# Patient Record
Sex: Female | Born: 1970
Health system: Southern US, Community
[De-identification: ages and names within clinical notes are randomized; demographics above are authoritative.]

## PROBLEM LIST (undated history)

## (undated) ENCOUNTER — Inpatient Hospital Stay: Admission: EM | Payer: Self-pay | Source: Home / Self Care

## (undated) DIAGNOSIS — R110 Nausea: Secondary | ICD-10-CM

## (undated) DIAGNOSIS — E039 Hypothyroidism, unspecified: Secondary | ICD-10-CM

## (undated) DIAGNOSIS — T451X5A Adverse effect of antineoplastic and immunosuppressive drugs, initial encounter: Secondary | ICD-10-CM

## (undated) DIAGNOSIS — I808 Phlebitis and thrombophlebitis of other sites: Secondary | ICD-10-CM

## (undated) DIAGNOSIS — I10 Essential (primary) hypertension: Secondary | ICD-10-CM

## (undated) DIAGNOSIS — F32A Depression, unspecified: Secondary | ICD-10-CM

## (undated) DIAGNOSIS — R51 Headache: Secondary | ICD-10-CM

## (undated) DIAGNOSIS — Z5189 Encounter for other specified aftercare: Secondary | ICD-10-CM

## (undated) DIAGNOSIS — E785 Hyperlipidemia, unspecified: Secondary | ICD-10-CM

## (undated) DIAGNOSIS — IMO0001 Reserved for inherently not codable concepts without codable children: Secondary | ICD-10-CM

## (undated) DIAGNOSIS — Z8672 Personal history of thrombophlebitis: Secondary | ICD-10-CM

## (undated) DIAGNOSIS — I82409 Acute embolism and thrombosis of unspecified deep veins of unspecified lower extremity: Secondary | ICD-10-CM

## (undated) DIAGNOSIS — F329 Major depressive disorder, single episode, unspecified: Secondary | ICD-10-CM

## (undated) DIAGNOSIS — F419 Anxiety disorder, unspecified: Secondary | ICD-10-CM

## (undated) DIAGNOSIS — K521 Toxic gastroenteritis and colitis: Secondary | ICD-10-CM

## (undated) DIAGNOSIS — D6851 Activated protein C resistance: Secondary | ICD-10-CM

## (undated) DIAGNOSIS — R300 Dysuria: Secondary | ICD-10-CM

## (undated) DIAGNOSIS — M1990 Unspecified osteoarthritis, unspecified site: Secondary | ICD-10-CM

## (undated) DIAGNOSIS — Z7901 Long term (current) use of anticoagulants: Secondary | ICD-10-CM

## (undated) DIAGNOSIS — Z95828 Presence of other vascular implants and grafts: Secondary | ICD-10-CM

## (undated) DIAGNOSIS — D689 Coagulation defect, unspecified: Secondary | ICD-10-CM

## (undated) DIAGNOSIS — C539 Malignant neoplasm of cervix uteri, unspecified: Secondary | ICD-10-CM

## (undated) DIAGNOSIS — I82621 Acute embolism and thrombosis of deep veins of right upper extremity: Secondary | ICD-10-CM

## (undated) DIAGNOSIS — D649 Anemia, unspecified: Secondary | ICD-10-CM

## (undated) DIAGNOSIS — IMO0002 Reserved for concepts with insufficient information to code with codable children: Secondary | ICD-10-CM

## (undated) DIAGNOSIS — K219 Gastro-esophageal reflux disease without esophagitis: Secondary | ICD-10-CM

## (undated) DIAGNOSIS — T7840XA Allergy, unspecified, initial encounter: Secondary | ICD-10-CM

## (undated) DIAGNOSIS — R519 Headache, unspecified: Secondary | ICD-10-CM

## (undated) HISTORY — DX: Hypothyroidism, unspecified: E03.9

## (undated) HISTORY — DX: Activated protein C resistance: D68.51

## (undated) HISTORY — DX: Allergy, unspecified, initial encounter: T78.40XA

## (undated) HISTORY — DX: Acute embolism and thrombosis of unspecified deep veins of unspecified lower extremity: I82.409

## (undated) HISTORY — DX: Anxiety disorder, unspecified: F41.9

## (undated) HISTORY — PX: CARPAL TUNNEL RELEASE: SHX101

## (undated) HISTORY — DX: Essential (primary) hypertension: I10

## (undated) HISTORY — DX: Hyperlipidemia, unspecified: E78.5

## (undated) HISTORY — DX: Unspecified osteoarthritis, unspecified site: M19.90

## (undated) HISTORY — DX: Coagulation defect, unspecified: D68.9

## (undated) HISTORY — DX: Encounter for other specified aftercare: Z51.89

---

## 1994-04-24 HISTORY — PX: TUBAL LIGATION: SHX77

## 2004-07-27 ENCOUNTER — Encounter: Admission: RE | Admit: 2004-07-27 | Discharge: 2004-07-27 | Payer: Self-pay | Admitting: Obstetrics and Gynecology

## 2005-08-08 ENCOUNTER — Emergency Department (HOSPITAL_COMMUNITY): Admission: EM | Admit: 2005-08-08 | Discharge: 2005-08-08 | Payer: Self-pay | Admitting: Emergency Medicine

## 2007-05-17 ENCOUNTER — Ambulatory Visit (HOSPITAL_COMMUNITY): Admission: RE | Admit: 2007-05-17 | Discharge: 2007-05-17 | Payer: Self-pay | Admitting: Obstetrics

## 2007-06-01 ENCOUNTER — Emergency Department (HOSPITAL_COMMUNITY): Admission: EM | Admit: 2007-06-01 | Discharge: 2007-06-01 | Payer: Self-pay | Admitting: Emergency Medicine

## 2007-11-23 ENCOUNTER — Emergency Department (HOSPITAL_COMMUNITY): Admission: EM | Admit: 2007-11-23 | Discharge: 2007-11-23 | Payer: Self-pay | Admitting: Emergency Medicine

## 2007-12-12 ENCOUNTER — Emergency Department (HOSPITAL_COMMUNITY): Admission: EM | Admit: 2007-12-12 | Discharge: 2007-12-12 | Payer: Self-pay | Admitting: Emergency Medicine

## 2008-02-04 ENCOUNTER — Encounter: Admission: RE | Admit: 2008-02-04 | Discharge: 2008-02-04 | Payer: Self-pay | Admitting: Family Medicine

## 2008-03-12 ENCOUNTER — Encounter: Admission: RE | Admit: 2008-03-12 | Discharge: 2008-03-12 | Payer: Self-pay | Admitting: Family Medicine

## 2008-04-25 ENCOUNTER — Emergency Department (HOSPITAL_COMMUNITY): Admission: EM | Admit: 2008-04-25 | Discharge: 2008-04-25 | Payer: Self-pay | Admitting: Specialist

## 2008-09-14 ENCOUNTER — Emergency Department (HOSPITAL_COMMUNITY): Admission: EM | Admit: 2008-09-14 | Discharge: 2008-09-14 | Payer: Self-pay | Admitting: Emergency Medicine

## 2008-12-07 ENCOUNTER — Encounter: Admission: RE | Admit: 2008-12-07 | Discharge: 2008-12-07 | Payer: Self-pay | Admitting: Family Medicine

## 2008-12-16 ENCOUNTER — Emergency Department (HOSPITAL_COMMUNITY): Admission: EM | Admit: 2008-12-16 | Discharge: 2008-12-16 | Payer: Self-pay | Admitting: Emergency Medicine

## 2009-03-24 ENCOUNTER — Emergency Department (HOSPITAL_COMMUNITY): Admission: EM | Admit: 2009-03-24 | Discharge: 2009-03-24 | Payer: Self-pay | Admitting: Emergency Medicine

## 2009-07-09 ENCOUNTER — Emergency Department (HOSPITAL_COMMUNITY): Admission: EM | Admit: 2009-07-09 | Discharge: 2009-07-09 | Payer: Self-pay | Admitting: Emergency Medicine

## 2009-09-10 ENCOUNTER — Emergency Department (HOSPITAL_COMMUNITY): Admission: EM | Admit: 2009-09-10 | Discharge: 2009-09-10 | Payer: Self-pay | Admitting: Emergency Medicine

## 2010-05-15 ENCOUNTER — Encounter: Payer: Self-pay | Admitting: Obstetrics

## 2010-07-11 LAB — URINALYSIS, ROUTINE W REFLEX MICROSCOPIC
Glucose, UA: NEGATIVE mg/dL
Hgb urine dipstick: NEGATIVE
Ketones, ur: NEGATIVE mg/dL
Protein, ur: NEGATIVE mg/dL
Urobilinogen, UA: 0.2 mg/dL (ref 0.0–1.0)

## 2010-07-11 LAB — POCT PREGNANCY, URINE: Preg Test, Ur: NEGATIVE

## 2010-07-15 ENCOUNTER — Emergency Department (HOSPITAL_COMMUNITY)
Admission: EM | Admit: 2010-07-15 | Discharge: 2010-07-15 | Disposition: A | Discharge status: Home / Self Care | Payer: Self-pay | Attending: Emergency Medicine | Admitting: Emergency Medicine

## 2010-07-15 DIAGNOSIS — R1031 Right lower quadrant pain: Secondary | ICD-10-CM | POA: Insufficient documentation

## 2010-07-15 LAB — BASIC METABOLIC PANEL
CO2: 25 mEq/L (ref 19–32)
Chloride: 106 mEq/L (ref 96–112)
GFR calc Af Amer: >60 mL/min (ref >60)
GFR calc non Af Amer: >60 mL/min (ref >60)
Glucose, Bld: 103 mg/dL — ABNORMAL HIGH (ref 70–99)
Potassium: 3.8 mEq/L (ref 3.5–5.1)
Sodium: 137 mEq/L (ref 135–145)

## 2010-07-15 LAB — URINALYSIS, ROUTINE W REFLEX MICROSCOPIC
Bilirubin Urine: NEGATIVE
Glucose, UA: NEGATIVE mg/dL
Hgb urine dipstick: NEGATIVE
Nitrite: NEGATIVE

## 2010-07-15 LAB — DIFFERENTIAL
Basophils Absolute: 0 10*3/uL (ref 0.0–0.1)
Basophils Relative: 0 % (ref 0–1)
Eosinophils Absolute: 0.1 10*3/uL (ref 0.0–0.7)
Monocytes Absolute: 0.5 10*3/uL (ref 0.1–1.0)
Monocytes Relative: 7 % (ref 3–12)
Neutro Abs: 4.7 10*3/uL (ref 1.7–7.7)
Neutrophils Relative %: 65 % (ref 43–77)

## 2010-07-15 LAB — CBC
Hemoglobin: 11.5 g/dL — ABNORMAL LOW (ref 12.0–15.0)
RBC: 4.2 MIL/uL (ref 3.87–5.11)
WBC: 7.3 10*3/uL (ref 4.0–10.5)

## 2010-07-16 ENCOUNTER — Other Ambulatory Visit (HOSPITAL_COMMUNITY): Payer: Self-pay | Admitting: Emergency Medicine

## 2010-07-16 ENCOUNTER — Ambulatory Visit (HOSPITAL_COMMUNITY)
Admit: 2010-07-16 | Discharge: 2010-07-16 | Disposition: A | Discharge status: Home / Self Care | Payer: Self-pay | Source: Ambulatory Visit | Attending: Emergency Medicine | Admitting: Emergency Medicine

## 2010-07-16 DIAGNOSIS — R102 Pelvic and perineal pain: Secondary | ICD-10-CM

## 2010-07-16 DIAGNOSIS — R1031 Right lower quadrant pain: Secondary | ICD-10-CM | POA: Insufficient documentation

## 2010-07-16 DIAGNOSIS — R1032 Left lower quadrant pain: Secondary | ICD-10-CM | POA: Insufficient documentation

## 2010-07-17 LAB — RAPID STREP SCREEN (MED CTR MEBANE ONLY): Streptococcus, Group A Screen (Direct): NEGATIVE

## 2010-07-18 ENCOUNTER — Other Ambulatory Visit (HOSPITAL_COMMUNITY): Payer: Self-pay

## 2010-07-26 LAB — DIFFERENTIAL
Eosinophils Absolute: 0.2 10*3/uL (ref 0.0–0.7)
Lymphocytes Relative: 36 % (ref 12–46)
Lymphs Abs: 2.7 10*3/uL (ref 0.7–4.0)
Monocytes Relative: 7 % (ref 3–12)
Neutrophils Relative %: 54 % (ref 43–77)

## 2010-07-26 LAB — URINALYSIS, ROUTINE W REFLEX MICROSCOPIC
Bilirubin Urine: NEGATIVE
Glucose, UA: NEGATIVE mg/dL
Protein, ur: NEGATIVE mg/dL
Urobilinogen, UA: 0.2 mg/dL (ref 0.0–1.0)

## 2010-07-26 LAB — BASIC METABOLIC PANEL
BUN: 11 mg/dL (ref 6–23)
Chloride: 104 mEq/L (ref 96–112)
Creatinine, Ser: 0.82 mg/dL (ref 0.4–1.2)
GFR calc Af Amer: >60 mL/min (ref >60)
GFR calc non Af Amer: >60 mL/min (ref >60)
Potassium: 3.6 mEq/L (ref 3.5–5.1)

## 2010-07-26 LAB — URINE MICROSCOPIC-ADD ON

## 2010-07-26 LAB — CBC
HCT: 34.9 % — ABNORMAL LOW (ref 36.0–46.0)
MCV: 83.5 fL (ref 78.0–100.0)
Platelets: 369 10*3/uL (ref 150–400)
RBC: 4.18 MIL/uL (ref 3.87–5.11)
WBC: 7.3 10*3/uL (ref 4.0–10.5)

## 2010-07-30 LAB — COMPREHENSIVE METABOLIC PANEL
AST: 21 U/L (ref 0–37)
BUN: 11 mg/dL (ref 6–23)
CO2: 29 mEq/L (ref 19–32)
Calcium: 10 mg/dL (ref 8.4–10.5)
Chloride: 99 mEq/L (ref 96–112)
Creatinine, Ser: 0.9 mg/dL (ref 0.4–1.2)
GFR calc non Af Amer: >60 mL/min (ref >60)
Glucose, Bld: 89 mg/dL (ref 70–99)
Total Bilirubin: 0.6 mg/dL (ref 0.3–1.2)

## 2010-07-30 LAB — POCT CARDIAC MARKERS
Myoglobin, poc: 40.1 ng/mL (ref 12–200)
Troponin i, poc: <0.05 ng/mL (ref 0.00–0.09)

## 2010-07-30 LAB — CBC
HCT: 37.2 % (ref 36.0–46.0)
MCHC: 34.6 g/dL (ref 30.0–36.0)
MCV: 83.5 fL (ref 78.0–100.0)
RBC: 4.46 MIL/uL (ref 3.87–5.11)
WBC: 7.8 10*3/uL (ref 4.0–10.5)

## 2010-07-30 LAB — DIFFERENTIAL
Basophils Absolute: 0.1 10*3/uL (ref 0.0–0.1)
Eosinophils Relative: 1 % (ref 0–5)
Lymphocytes Relative: 25 % (ref 12–46)
Lymphs Abs: 1.9 10*3/uL (ref 0.7–4.0)
Neutro Abs: 5.2 10*3/uL (ref 1.7–7.7)
Neutrophils Relative %: 66 % (ref 43–77)

## 2010-08-08 LAB — BASIC METABOLIC PANEL
CO2: 26 mEq/L (ref 19–32)
Calcium: 9.5 mg/dL (ref 8.4–10.5)
GFR calc Af Amer: >60 mL/min (ref >60)
GFR calc non Af Amer: >60 mL/min (ref >60)
Potassium: 4.5 mEq/L (ref 3.5–5.1)
Sodium: 137 mEq/L (ref 135–145)

## 2010-08-08 LAB — URINALYSIS, ROUTINE W REFLEX MICROSCOPIC
Bilirubin Urine: NEGATIVE
Nitrite: POSITIVE — AB
Protein, ur: 100 mg/dL — AB
pH: 5 (ref 5.0–8.0)

## 2010-08-08 LAB — DIFFERENTIAL
Lymphocytes Relative: 14 % (ref 12–46)
Monocytes Absolute: 0.7 10*3/uL (ref 0.1–1.0)
Monocytes Relative: 6 % (ref 3–12)
Neutro Abs: 9 10*3/uL — ABNORMAL HIGH (ref 1.7–7.7)

## 2010-08-08 LAB — CBC
HCT: 38.8 % (ref 36.0–46.0)
Hemoglobin: 12.9 g/dL (ref 12.0–15.0)
RBC: 4.61 MIL/uL (ref 3.87–5.11)

## 2010-08-08 LAB — URINE MICROSCOPIC-ADD ON

## 2011-03-20 ENCOUNTER — Emergency Department (HOSPITAL_COMMUNITY)
Admission: EM | Admit: 2011-03-20 | Discharge: 2011-03-20 | Disposition: A | Discharge status: Home / Self Care | Payer: Self-pay | Attending: Emergency Medicine | Admitting: Emergency Medicine

## 2011-03-20 ENCOUNTER — Encounter: Payer: Self-pay | Admitting: *Deleted

## 2011-03-20 DIAGNOSIS — H6690 Otitis media, unspecified, unspecified ear: Secondary | ICD-10-CM | POA: Insufficient documentation

## 2011-03-20 DIAGNOSIS — R51 Headache: Secondary | ICD-10-CM | POA: Insufficient documentation

## 2011-03-20 DIAGNOSIS — H6691 Otitis media, unspecified, right ear: Secondary | ICD-10-CM

## 2011-03-20 MED ORDER — HYDROCODONE-ACETAMINOPHEN 5-325 MG PO TABS
2.0000 | ORAL_TABLET | Freq: Once | ORAL | Status: AC
  Administered 2011-03-20: 2 via ORAL
  Filled 2011-03-20: qty 2

## 2011-03-20 MED ORDER — HYDROCODONE-ACETAMINOPHEN 5-325 MG PO TABS: 1.0000 | ORAL_TABLET | Freq: Four times a day (QID) | ORAL | Status: AC | PRN

## 2011-03-20 MED ORDER — ANTIPYRINE-BENZOCAINE 5.4-1.4 % OT SOLN
3.0000 [drp] | OTIC | Status: DC | PRN
  Administered 2011-03-20: 3 [drp] via OTIC
  Filled 2011-03-20: qty 10

## 2011-03-20 MED ORDER — PENICILLIN G BENZATHINE 1200000 UNIT/2ML IM SUSP
1.2000 10*6.[IU] | Freq: Once | INTRAMUSCULAR | Status: AC
  Administered 2011-03-20: 1.2 10*6.[IU] via INTRAMUSCULAR
  Filled 2011-03-20: qty 2

## 2011-03-20 NOTE — ED Notes (Signed)
Pt ambulatory and stable at discharge 

## 2011-03-20 NOTE — ED Provider Notes (Signed)
History     CSN: [id number] Arrival date & time: 03/20/2011  4:06 AM   None     Chief Complaint  Patient presents with  . Otalgia    R ear    (Consider location/radiation/quality/duration/timing/severity/associated sxs/prior treatment) HPI This is a 40 year old white female who woke up about an hour ago with severe right ear pain. She has had a scratchy throat for the past 3 days but no other cold symptoms. She denies fever. She states the pain in her right ears radiating to the right side of her head causing a headache. There are no mitigating or exacerbating factors. The right ear does feel stuffy.  History reviewed. No pertinent past medical history.  Past Surgical History  Procedure Date  . C section 1991   . Carpul tunnel on both hands 2002 and 2009     History reviewed. No pertinent family history.  History  Substance Use Topics  . Smoking status: Never Smoker   . Smokeless tobacco: Not on file  . Alcohol Use: No    OB History    Grav Para Term Preterm Abortions TAB SAB Ect Mult Living                  Review of Systems  All other systems reviewed and are negative.    Allergies  Review of patient's allergies indicates no known allergies.  Home Medications  No current outpatient prescriptions on file.  BP 112/79  Pulse 72  Temp(Src) 97.7 F (36.5 C) (Oral)  Resp 20  Ht 5\' 3"  (1.6 m)  Wt 180 lb (81.647 kg)  BMI 31.89 kg/m2  SpO2 98%  Physical Exam General: Well-developed, well-nourished female in no acute distress; appearance consistent with age of record HENT: normocephalic, atraumatic; left TM normal, right TM erythematous; no TMJ tenderness Eyes: Normal Neck: supple; no Heart: regular rate and rhythm Lungs: clear to auscultation bilaterally Abdomen: soft; nontender; nondistended Extremities: No deformity; full range of motion Neurologic: Awake, alert and oriented; motor function intact in all extremities and symmetric; no facial  droop Skin: Warm and dry     ED Course  Procedures (including critical care time)    MDM   The patient requests an antibiotic shot rather than by mouth medications.          Hanley Seamen, MD 03/20/11 [phone]

## 2011-03-20 NOTE — ED Notes (Signed)
Pt woke up from sleeping with R ear hurting, has had a slight sore throat x 3 days no congestion; Having some problems hearing out of it. Painful along with ha

## 2011-03-20 NOTE — ED Notes (Signed)
R ear pain; no congestion; Pain at 10 with headache also; difficult to hear at times

## 2011-11-10 ENCOUNTER — Encounter (HOSPITAL_COMMUNITY): Payer: Self-pay | Admitting: *Deleted

## 2011-11-10 ENCOUNTER — Emergency Department (HOSPITAL_COMMUNITY): Payer: BC Managed Care – PPO

## 2011-11-10 ENCOUNTER — Emergency Department (HOSPITAL_COMMUNITY)
Admission: EM | Admit: 2011-11-10 | Discharge: 2011-11-10 | Disposition: A | Discharge status: Home / Self Care | Payer: BC Managed Care – PPO | Attending: Emergency Medicine | Admitting: Emergency Medicine

## 2011-11-10 DIAGNOSIS — S335XXA Sprain of ligaments of lumbar spine, initial encounter: Secondary | ICD-10-CM | POA: Insufficient documentation

## 2011-11-10 DIAGNOSIS — S39012A Strain of muscle, fascia and tendon of lower back, initial encounter: Secondary | ICD-10-CM

## 2011-11-10 DIAGNOSIS — X58XXXA Exposure to other specified factors, initial encounter: Secondary | ICD-10-CM | POA: Insufficient documentation

## 2011-11-10 LAB — URINALYSIS, ROUTINE W REFLEX MICROSCOPIC
Bilirubin Urine: NEGATIVE
Glucose, UA: NEGATIVE mg/dL
Hgb urine dipstick: NEGATIVE
Specific Gravity, Urine: >1.03 — ABNORMAL HIGH (ref 1.005–1.030)
pH: 6 (ref 5.0–8.0)

## 2011-11-10 MED ORDER — IBUPROFEN 600 MG PO TABS: 600.0000 mg | ORAL_TABLET | Freq: Four times a day (QID) | ORAL | Status: AC | PRN

## 2011-11-10 MED ORDER — HYDROCODONE-ACETAMINOPHEN 5-325 MG PO TABS: 1.0000 | ORAL_TABLET | ORAL | Status: AC | PRN

## 2011-11-10 MED ORDER — KETOROLAC TROMETHAMINE 60 MG/2ML IM SOLN
60.0000 mg | Freq: Once | INTRAMUSCULAR | Status: AC
  Administered 2011-11-10: 60 mg via INTRAMUSCULAR
  Filled 2011-11-10: qty 2

## 2011-11-10 NOTE — ED Notes (Signed)
Lower back pain x 3 days. No known injury. Denies urinary symptoms.

## 2011-11-13 NOTE — ED Provider Notes (Signed)
History     CSN: [id number]  Arrival date & time 11/10/11  7829   First MD Initiated Contact with Patient 11/10/11 [phone]      Chief Complaint  Patient presents with  . Back Pain    (Consider location/radiation/quality/duration/timing/severity/associated sxs/prior treatment) HPI Comments: Lindsey Hughes  presents with acute low back pain which has which has been present for the past 3 days.   Patient denies any new injury specifically, but woke with her symptoms. Her pain is worsened with movement and palpation and is sharp.   There is not radiation into the  lower extremity.  There has been no weakness or numbness in the lower extremities and no urinary or bowel retention or incontinence.  Patient does not have a history of cancer or IVDU.   The history is provided by the patient.    History reviewed. No pertinent past medical history.  Past Surgical History  Procedure Date  . C section 1991   . Carpul tunnel on both hands 2002 and 2009     No family history on file.  History  Substance Use Topics  . Smoking status: Never Smoker   . Smokeless tobacco: Not on file  . Alcohol Use: No    OB History    Grav Para Term Preterm Abortions TAB SAB Ect Mult Living                  Review of Systems  Constitutional: Negative for fever.  Respiratory: Negative for shortness of breath.   Cardiovascular: Negative for chest pain and leg swelling.  Gastrointestinal: Negative for abdominal pain, constipation and abdominal distention.  Genitourinary: Negative for dysuria, urgency, frequency, flank pain and difficulty urinating.  Musculoskeletal: Positive for back pain. Negative for joint swelling and gait problem.  Skin: Negative for rash.  Neurological: Negative for weakness and numbness.    Allergies  Review of patient's allergies indicates no known allergies.  Home Medications   Current Outpatient Rx  Name Route Sig Dispense Refill  . HYDROCODONE-ACETAMINOPHEN 5-325 MG PO  TABS Oral Take 1 tablet by mouth every 4 (four) hours as needed for pain. 20 tablet 0  . IBUPROFEN 600 MG PO TABS Oral Take 1 tablet (600 mg total) by mouth every 6 (six) hours as needed for pain. 30 tablet 0    BP 109/70  Pulse 74  Temp 98.2 F (36.8 C) (Oral)  Resp 16  Ht 5\' 2"  (1.575 m)  Wt 190 lb (86.183 kg)  BMI 34.75 kg/m2  SpO2 100%  LMP 10/21/2011  Physical Exam  Nursing note and vitals reviewed. Constitutional: She appears well-developed and well-nourished.  HENT:  Head: Normocephalic.  Eyes: Conjunctivae are normal.  Neck: Normal range of motion. Neck supple.  Cardiovascular: Normal rate and intact distal pulses.        Pedal pulses normal.  Pulmonary/Chest: Effort normal.  Abdominal: Soft. Bowel sounds are normal. She exhibits no distension and no mass.  Musculoskeletal: Normal range of motion. She exhibits tenderness. She exhibits no edema.       Lumbar back: She exhibits no swelling, no edema and no spasm.       Right paralumbar tenderness.  No midline ttp.  Neurological: She is alert. She has normal strength. She displays no atrophy and no tremor. No sensory deficit. Gait normal.  Reflex Scores:      Patellar reflexes are 2+ on the right side and 2+ on the left side.      Achilles reflexes  are 2+ on the right side and 2+ on the left side.      No strength deficit noted in hip and knee flexor and extensor muscle groups.  Ankle flexion and extension intact.  Skin: Skin is warm and dry.  Psychiatric: She has a normal mood and affect.    ED Course  Procedures (including critical care time)  Labs Reviewed  URINALYSIS, ROUTINE W REFLEX MICROSCOPIC - Abnormal; Notable for the following:    Specific Gravity, Urine >1.030 (*)     All other components within normal limits  POCT PREGNANCY, URINE  LAB REPORT - SCANNED   No results found.   1. Lumbar strain       MDM  Pt given toradol 60 mg IM injection with improvement in sx.  Prescribed ibuprofen,   Hydrocodone,  Advised heat therapy, avoid lifting, bending, twisting.  Recheck if not improved over the next week.  No neuro deficit on exam or by history to suggest emergent or surgical presentation.          Burgess Amor, [place] 11/13/11 1025

## 2011-11-14 NOTE — ED Provider Notes (Signed)
Medical screening examination/treatment/procedure(s) were performed by non-physician practitioner and as supervising physician I was immediately available for consultation/collaboration.   Kana Reimann W. Nnaemeka Samson, MD 11/14/11 2156 

## 2011-11-15 ENCOUNTER — Emergency Department (HOSPITAL_COMMUNITY)
Admission: EM | Admit: 2011-11-15 | Discharge: 2011-11-15 | Disposition: A | Discharge status: Home / Self Care | Payer: BC Managed Care – PPO | Attending: Emergency Medicine | Admitting: Emergency Medicine

## 2011-11-15 ENCOUNTER — Encounter (HOSPITAL_COMMUNITY): Payer: Self-pay | Admitting: Emergency Medicine

## 2011-11-15 DIAGNOSIS — L259 Unspecified contact dermatitis, unspecified cause: Secondary | ICD-10-CM

## 2011-11-15 MED ORDER — HYDROXYZINE HCL 25 MG PO TABS: 50.0000 mg | ORAL_TABLET | Freq: Four times a day (QID) | ORAL | Status: AC

## 2011-11-15 MED ORDER — ALUM SULFATE-CA ACETATE EX PACK: 1.0000 | PACK | Freq: Three times a day (TID) | CUTANEOUS | Status: AC

## 2011-11-15 MED ORDER — PREDNISONE 20 MG PO TABS
40.0000 mg | ORAL_TABLET | Freq: Once | ORAL | Status: AC
  Administered 2011-11-15: 40 mg via ORAL
  Filled 2011-11-15: qty 2

## 2011-11-15 MED ORDER — PREDNISONE 20 MG PO TABS: 40.0000 mg | ORAL_TABLET | Freq: Every day | ORAL | Status: AC

## 2011-11-15 NOTE — ED Notes (Signed)
Patient c/o poison ivy on bilateral legs, arms.  Patient states now her lip is tingling and c/o left eye itching.

## 2011-11-15 NOTE — ED Provider Notes (Signed)
History     CSN: [id number]  Arrival date & time 11/15/11  0434   First MD Initiated Contact with Patient 11/15/11 0501      Chief Complaint  Patient presents with  . Poison Ivy    (Consider location/radiation/quality/duration/timing/severity/associated sxs/prior treatment) HPI Comments: Lindsey Hughes 41 y.o. female   The chief complaint is: Patient presents with:   Poison Ivy   The patient has medical history significant for:   History reviewed. No pertinent past medical history.  The onset of the symptoms was 5 days ago,  The Course is constant.  She has tried multiple topical agents with little relief. Has associated redness and pruritus Denies pain wheezing, SOB, vision changes or dicharge from eye. She states that she was unable to sleep this evening and started having some itchig around her mouth and in her left eye which concerned her.  Denies any constitutional symptoms. She has no abdominal symtpoms. She     Patient is a 41 y.o. female presenting with Poison Ivy. The history is provided by the patient. No language interpreter was used.  Poison Ivy Associated symptoms include a rash. Pertinent negatives include no chest pain, nausea or vomiting.    History reviewed. No pertinent past medical history.  Past Surgical History  Procedure Date  . C section 1991   . Carpul tunnel on both hands 2002 and 2009     No family history on file.  History  Substance Use Topics  . Smoking status: Never Smoker   . Smokeless tobacco: Not on file  . Alcohol Use: No    OB History    Grav Para Term Preterm Abortions TAB SAB Ect Mult Living                  Review of Systems  Constitutional: Negative.   Eyes: Positive for itching. Negative for pain, discharge, redness and visual disturbance.  Respiratory: Negative for shortness of breath and wheezing.   Cardiovascular: Negative for chest pain.  Gastrointestinal: Negative for nausea and vomiting.  Skin:  Positive for rash.    Allergies  Review of patient's allergies indicates no known allergies.  Home Medications   Current Outpatient Rx  Name Route Sig Dispense Refill  . IBUPROFEN 600 MG PO TABS Oral Take 1 tablet (600 mg total) by mouth every 6 (six) hours as needed for pain. 30 tablet 0  . ALUM SULFATE-CA ACETATE EX PACK Topical Apply 1 packet topically 3 (three) times daily. 100 each 12  . HYDROCODONE-ACETAMINOPHEN 5-325 MG PO TABS Oral Take 1 tablet by mouth every 4 (four) hours as needed for pain. 20 tablet 0  . HYDROXYZINE HCL 25 MG PO TABS Oral Take 2 tablets (50 mg total) by mouth every 6 (six) hours. 12 tablet 0  . PREDNISONE 20 MG PO TABS Oral Take 2 tablets (40 mg total) by mouth daily. 14 tablet 0    BP 131/77  Pulse 51  Temp 97.8 F (36.6 C) (Oral)  Resp 18  Ht 5\' 2"  (1.575 m)  Wt 190 lb (86.183 kg)  BMI 34.75 kg/m2  SpO2 100%  LMP 11/12/2011  Physical Exam  Constitutional: She is oriented to person, place, and time. She appears well-developed and well-nourished.  HENT:  Head: Normocephalic and atraumatic.       No visible lesion on lips  Eyes: Conjunctivae are normal. Right eye exhibits no discharge. Left eye exhibits no discharge.       No visible lesions around the  eye.  Neck: Normal range of motion.  Cardiovascular: Normal rate and regular rhythm.   Pulmonary/Chest: Effort normal. No respiratory distress. She has no wheezes.  Neurological: She is alert and oriented to person, place, and time.  Skin: Skin is warm and dry. Rash (Red linear papuluvesicular rash with excoriations in differint stages of eruption and healing.  No signs of infection) noted.    ED Course  Procedures (including critical care time)  Labs Reviewed - No data to display No results found.   1. Contact dermatitis     BP 131/77  Pulse 51  Temp 97.8 F (36.6 C) (Oral)  Resp 18  Ht 5\' 2"  (1.575 m)  Wt 190 lb (86.183 kg)  BMI 34.75 kg/m2  SpO2 100%  LMP 11/12/2011   MDM    The patient appears to have contact dermatitis after clearing brush.   She does not appear to be having any airway compromise. Eye exam is WNL. VS are stable.Patient will treat with Prednisone  atarax and domeboro soaks. Patient agrees with plan, discussed reasons to return.        Arthor Captain, PA-C 11/15/11 [phone]

## 2011-11-15 NOTE — ED Notes (Signed)
Pt is a 41 y/o female who was helping to clear brush from the land 5 days ago and devleoped a red itchy, linear rash on her arms and legs where she was not covered - it is persistent, moderate, not associated with intraoral swelling or wheezign / SOB.  Sx are not improving significantly with topical cortisone.  PE:  Well appearing, pruritic, erythematous linear rash over bil lower EXT and UE below the knee and elbow.  No obvious lesions to the face / eyes and no intraoral lesions.  A/p - moderate poison ivy exposure - oral prednisone, domeboro sol'n and potent antihistamine, pt in agreement.  Medical screening examination/treatment/procedure(s) were conducted as a shared visit with non-physician practitioner(s) and myself.  I personally evaluated the patient during the encounter    Vida Roller, MD 11/15/11 [phone]

## 2011-11-17 NOTE — ED Provider Notes (Addendum)
   Pt is a 41 y/o female who was helping to clear brush from the land 5 days ago and devleoped a red itchy, linear rash on her arms and legs where she was not covered - it is persistent, moderate, not associated with intraoral swelling or wheezign / SOB. Sx are not improving significantly with topical cortisone.  PE: Well appearing, pruritic, erythematous linear rash over bil lower EXT and UE below the knee and elbow. No obvious lesions to the face / eyes and no intraoral lesions.  A/p - moderate poison ivy exposure - oral prednisone, domeboro sol'n and potent antihistamine, pt in agreement.  Medical screening examination/treatment/procedure(s) were conducted as a shared visit with non-physician practitioner(s) and myself. I personally evaluated the patient during the encounter   Vida Roller, MD 11/17/11 1540  Vida Roller, MD 12/06/11 [phone]

## 2013-04-24 HISTORY — PX: LASIK: SHX215

## 2014-06-01 ENCOUNTER — Other Ambulatory Visit: Payer: Self-pay | Admitting: Otolaryngology

## 2014-06-01 DIAGNOSIS — R221 Localized swelling, mass and lump, neck: Secondary | ICD-10-CM

## 2014-06-09 ENCOUNTER — Ambulatory Visit
Admission: RE | Admit: 2014-06-09 | Discharge: 2014-06-09 | Disposition: A | Discharge status: Home / Self Care | Payer: BLUE CROSS/BLUE SHIELD | Source: Ambulatory Visit | Attending: Otolaryngology | Admitting: Otolaryngology

## 2014-06-09 DIAGNOSIS — R221 Localized swelling, mass and lump, neck: Secondary | ICD-10-CM

## 2014-06-09 MED ORDER — IOHEXOL 300 MG/ML  SOLN
75.0000 mL | Freq: Once | INTRAMUSCULAR | Status: AC | PRN
Start: 2014-06-09 — End: 2014-06-09
  Administered 2014-06-09: 75 mL via INTRAVENOUS

## 2014-11-19 ENCOUNTER — Emergency Department (HOSPITAL_COMMUNITY): Payer: BLUE CROSS/BLUE SHIELD

## 2014-11-19 ENCOUNTER — Emergency Department (HOSPITAL_COMMUNITY)
Admission: EM | Admit: 2014-11-19 | Discharge: 2014-11-19 | Disposition: A | Discharge status: Home / Self Care | Payer: BLUE CROSS/BLUE SHIELD | Attending: Emergency Medicine | Admitting: Emergency Medicine

## 2014-11-19 ENCOUNTER — Encounter (HOSPITAL_COMMUNITY): Payer: Self-pay | Admitting: Emergency Medicine

## 2014-11-19 DIAGNOSIS — E039 Hypothyroidism, unspecified: Secondary | ICD-10-CM | POA: Diagnosis not present

## 2014-11-19 DIAGNOSIS — Z7982 Long term (current) use of aspirin: Secondary | ICD-10-CM | POA: Diagnosis not present

## 2014-11-19 DIAGNOSIS — Z79899 Other long term (current) drug therapy: Secondary | ICD-10-CM | POA: Insufficient documentation

## 2014-11-19 DIAGNOSIS — R079 Chest pain, unspecified: Secondary | ICD-10-CM | POA: Diagnosis present

## 2014-11-19 LAB — BASIC METABOLIC PANEL
ANION GAP: 8 (ref 5–15)
BUN: 16 mg/dL (ref 6–20)
CALCIUM: 8.7 mg/dL — AB (ref 8.9–10.3)
CO2: 26 mmol/L (ref 22–32)
CREATININE: 0.8 mg/dL (ref 0.44–1.00)
Chloride: 102 mmol/L (ref 101–111)
GFR calc Af Amer: >60 mL/min (ref >60)
Glucose, Bld: 101 mg/dL — ABNORMAL HIGH (ref 65–99)
Potassium: 3.9 mmol/L (ref 3.5–5.1)
SODIUM: 136 mmol/L (ref 135–145)

## 2014-11-19 LAB — CBC
HEMATOCRIT: 33.8 % — AB (ref 36.0–46.0)
HEMOGLOBIN: 10.9 g/dL — AB (ref 12.0–15.0)
MCH: 26.6 pg (ref 26.0–34.0)
MCHC: 32.2 g/dL (ref 30.0–36.0)
MCV: 82.4 fL (ref 78.0–100.0)
PLATELETS: 389 10*3/uL (ref 150–400)
RBC: 4.1 MIL/uL (ref 3.87–5.11)
RDW: 14 % (ref 11.5–15.5)
WBC: 9.4 10*3/uL (ref 4.0–10.5)

## 2014-11-19 LAB — TROPONIN I

## 2014-11-19 NOTE — ED Notes (Signed)
Patient complaining of chest pain and tightness that started today around 1730. Patient just seen at [facility] urgent care for same. Denies other symptoms.

## 2014-11-19 NOTE — Discharge Instructions (Signed)
Your testing here shows that you are not likely having a heart attack - your blood work, Chest xray and EKG were normal - I cannot tell you that you do not have a blockage - you will need to have a stress test to tell you that.  Call cardiology today  Return to the ER for severe or worsening pain / difficulty breathing / vomiting  Please obtain all of your results from medical records or have your doctors office obtain the results - share them with your doctor - you should be seen at your doctors office in the next 2 days. Call today to arrange your follow up. Take the medications as prescribed. Please review all of the medicines and only take them if you do not have an allergy to them. Please be aware that if you are taking birth control pills, taking other prescriptions, ESPECIALLY ANTIBIOTICS may make the birth control ineffective - if this is the case, either do not engage in sexual activity or use alternative methods of birth control such as condoms until you have finished the medicine and your family doctor says it is OK to restart them. If you are on a blood thinner such as COUMADIN, be aware that any other medicine that you take may cause the coumadin to either work too much, or not enough - you should have your coumadin level rechecked in next 7 days if this is the case.  ?  It is also a possibility that you have an allergic reaction to any of the medicines that you have been prescribed - Everybody reacts differently to medications and while MOST people have no trouble with most medicines, you may have a reaction such as nausea, vomiting, rash, swelling, shortness of breath. If this is the case, please stop taking the medicine immediately and contact your physician.  ?  You should return to the ER if you develop severe or worsening symptoms.

## 2014-11-19 NOTE — ED Provider Notes (Signed)
CSN: [id number]     Arrival date & time 11/19/14  1912 History   First MD Initiated Contact with Patient 11/19/14 1922     Chief Complaint  Patient presents with  . Chest Pain     (Consider location/radiation/quality/duration/timing/severity/associated sxs/prior Treatment) HPI Comments: CP started at 5:30 - while watching TV - non radiating, no sob, n/v/diaph, no hx of same and no RF for ACS other than a brother who had MI in his 87's.  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  This gets worse with supine position.  Patient is a 44 y.o. female presenting with chest pain. The history is provided by the patient.  Chest Pain   Past Medical History  Diagnosis Date  . Hypothyroid    Past Surgical History  Procedure Laterality Date  . C section 1991    . Carpul tunnel on both hands 2002 and 2009     History reviewed. No pertinent family history. History  Substance Use Topics  . Smoking status: Never Smoker   . Smokeless tobacco: Not on file  . Alcohol Use: No   OB History    No data available     Review of Systems  Cardiovascular: Positive for chest pain.  All other systems reviewed and are negative.     Allergies  Review of patient's allergies indicates not on file.  Home Medications   Prior to Admission medications   Medication Sig Start Date End Date Taking? Authorizing Provider  aspirin EC 81 MG tablet Take 324 mg by mouth once as needed for mild pain or moderate pain.   Yes Historical Provider, MD  ibuprofen (ADVIL,MOTRIN) 800 MG tablet Take 800 mg by mouth every 8 (eight) hours as needed for mild pain or moderate pain.  10/07/14  Yes Historical Provider, MD  levothyroxine (SYNTHROID, LEVOTHROID) 25 MCG tablet Take 25 mcg by mouth daily. 11/18/14  Yes Historical Provider, MD  nitroGLYCERIN (NITROSTAT) 0.4 MG SL tablet Place 0.4 mg under the tongue once as needed for chest pain.   Yes  Historical Provider, MD   BP 133/83 mmHg  Pulse 82  Temp(Src) 97.9 F (36.6 C) (Oral)  Resp 20  Ht 5\' 2"  (1.575 m)  Wt 210 lb (95.255 kg)  BMI 38.40 kg/m2  SpO2 100%  LMP 10/23/2014 Physical Exam  Constitutional: She appears well-developed and well-nourished. No distress.  HENT:  Head: Normocephalic and atraumatic.  Mouth/Throat: Oropharynx is clear and moist. No oropharyngeal exudate.  Eyes: Conjunctivae and EOM are normal. Pupils are equal, round, and reactive to light. Right eye exhibits no discharge. Left eye exhibits no discharge. No scleral icterus.  Neck: Normal range of motion. Neck supple. No JVD present. No thyromegaly present.  Cardiovascular: Normal rate, regular rhythm, normal heart sounds and intact distal pulses.  Exam reveals no gallop and no friction rub.   No murmur heard. Pulmonary/Chest: Effort normal and breath sounds normal. No respiratory distress. She has no wheezes. She has no rales. She exhibits no tenderness.  Abdominal: Soft. Bowel sounds are normal. She exhibits no distension and no mass. There is no tenderness.  Musculoskeletal: Normal range of motion. She exhibits no edema or tenderness.  Lymphadenopathy:    She has no cervical adenopathy.  Neurological: She is alert. Coordination normal.  Skin: Skin is warm and dry. No rash noted. No erythema.  Psychiatric: She has a normal mood and affect.  Her behavior is normal.  Nursing note and vitals reviewed.   ED Course  Procedures (including critical care time) Labs Review Labs Reviewed  BASIC METABOLIC PANEL - Abnormal; Notable for the following:    Glucose, Bld 101 (*)    Calcium 8.7 (*)    All other components within normal limits  CBC - Abnormal; Notable for the following:    Hemoglobin 10.9 (*)    HCT 33.8 (*)    All other components within normal limits  TROPONIN I    Imaging Review Dg Chest 2 View  11/19/2014   CLINICAL DATA:  Central chest pain since 5:30 p.m. today  EXAM: CHEST  2 VIEW   COMPARISON:  Sep 14, 2008  FINDINGS: The heart size and mediastinal contours are stable. There is no focal infiltrate, pulmonary edema, or pleural effusion. The visualized skeletal structures are unremarkable.  IMPRESSION: No active cardiopulmonary disease.   Electronically Signed   By: Abelardo Diesel M.D.   On: 11/19/2014 20:29      MDM   Final diagnoses:  Chest pain, unspecified chest pain type    ECG without acute finsdings - VS normal - Trop pending, low risk ACS r/o in ED likely - nitro did not make it better at [facility] prior to being sent her.  Normal ECG.  ED ECG REPORT  I personally interpreted this EKG   Date: 11/19/2014   Rate: 74  Rhythm: normal sinus rhythm  QRS Axis: normal  Intervals: normal  ST/T Wave abnormalities: normal  Conduction Disutrbances:none  Narrative Interpretation:   Old EKG Reviewed: none available   the patient's chest pain has almost completely resolved, her chest x-ray is totally normal, her blood work is normal, her EKG is normal. I have discussed with the patient at length the indications for return as well as the reasons to follow-up with a cardiologist and I have strongly recommended that she see the cardiologist for a stress test.  She has normal labs here - I have educated her on the need to evaluate for blockages, in addition to the blood work and she agrees to f/u with cardiolgy and return here should her pain worsen - she is pain free on d/c according to the pt.  Noemi Chapel, MD 11/19/14 2137

## 2014-11-19 NOTE — ED Notes (Signed)
Discharge instructions given, pt demonstrated teach back and verbal understanding. No concerns voiced.  

## 2014-11-27 ENCOUNTER — Encounter: Payer: Self-pay | Admitting: *Deleted

## 2014-11-27 ENCOUNTER — Encounter: Payer: Self-pay | Admitting: Cardiology

## 2014-11-27 ENCOUNTER — Ambulatory Visit (INDEPENDENT_AMBULATORY_CARE_PROVIDER_SITE_OTHER): Payer: BLUE CROSS/BLUE SHIELD | Admitting: Cardiology

## 2014-11-27 VITALS — BP 113/77 | HR 70 | Ht 62.0 in | Wt 207.1 lb

## 2014-11-27 DIAGNOSIS — E039 Hypothyroidism, unspecified: Secondary | ICD-10-CM | POA: Diagnosis not present

## 2014-11-27 DIAGNOSIS — R072 Precordial pain: Secondary | ICD-10-CM

## 2014-11-27 NOTE — Patient Instructions (Signed)
Your physician recommends that you continue on your current medications as directed. Please refer to the Current Medication list given to you today. Your physician has requested that you have a stress echocardiogram. For further information please visit [email or web address]. Please follow instruction sheet as given. We will call you with your result.

## 2014-11-27 NOTE — Progress Notes (Signed)
Cardiology Office Note  Date: 11/27/2014   ID: Lindsey Hughes, DOB [birth year], MRN [id number]  PCP: Charolette Forward, PA-C  Consulting Cardiologist: Rozann Lesches, MD   Chief Complaint  Patient presents with  . Chest Pain    History of Present Illness: Lindsey Hughes is a 44 y.o. female referred for cardiology consultation by Dr. Noemi Chapel following ER visit at [facility] recently. She presents for evaluation of chest discomfort. She states that she felt a "tightness" in her chest that began at rest on the day of evaluation in the ER, was seen at an urgent care in [place] prior to presenting to [facility] Symptoms lasted for at least 2 or 3 hours, not entirely clear that they resolved in association with nitroglycerin. She states that she gets less intense, more brief chest discomfort at times unrelated to this presentation, although not specifically exertional.   She has no personal history of hypertension or diabetes mellitus. Family history includes premature CAD in her mother and brother however. She has not undergone any prior cardiac ischemic testing.  Evaluation in the ER found normal cardiac markers, unremarkable ECG and chest x-ray.  She maintains follow-up with primary care, we reviewed her medications.   Past Medical History  Diagnosis Date  . Hypothyroidism     Past Surgical History  Procedure Laterality Date  . Cesarean section  1991   . Carpal tunnel release Bilateral 2002 and 2009    Current Outpatient Prescriptions  Medication Sig Dispense Refill  . ALPRAZolam (XANAX) 0.25 MG tablet Take 0.25 mg by mouth as needed for anxiety.    Marland Kitchen aspirin 81 MG tablet Take 81 mg by mouth daily.    . ciprofloxacin (CIPRO) 500 MG tablet Take 500 mg by mouth 2 (two) times daily.    Marland Kitchen ibuprofen (ADVIL,MOTRIN) 800 MG tablet Take 800 mg by mouth every 8 (eight) hours as needed for mild pain or moderate pain.   4  . levothyroxine (SYNTHROID, LEVOTHROID) 50 MCG tablet  Take 50 mcg by mouth daily.    . sertraline (ZOLOFT) 25 MG tablet Take 25 mg by mouth daily.     No current facility-administered medications for this visit.    Allergies:  Review of patient's allergies indicates no known allergies.   Social History: The patient  reports that she has never smoked. She does not have any smokeless tobacco history on file. She reports that she does not drink alcohol or use illicit drugs.   Family History: The patient's family history includes Brain cancer in her father; CAD in her brother and mother; Lung cancer in her father.   ROS:  Please see the history of present illness. Otherwise, complete review of systems is positive for none.  All other systems are reviewed and negative.   Physical Exam: VS:  BP 113/77 mmHg  Pulse 70  Ht 5\' 2"  (1.575 m)  Wt 207 lb 1.9 oz (93.949 kg)  BMI 37.87 kg/m2  SpO2 99%  LMP 10/23/2014, BMI Body mass index is 37.87 kg/(m^2).  Wt Readings from Last 3 Encounters:  11/27/14 207 lb 1.9 oz (93.949 kg)  11/19/14 210 lb (95.255 kg)  11/15/11 190 lb (86.183 kg)     General: Overweight woman, appears comfortable at rest. HEENT: Conjunctiva and lids normal, oropharynx clear. Neck: Supple, no elevated JVP or carotid bruits, no thyromegaly. Lungs: Clear to auscultation, nonlabored breathing at rest. Cardiac: Regular rate and rhythm, no S3 or significant systolic murmur, no pericardial rub. Abdomen: Soft,  nontender, bowel sounds present, no guarding or rebound. Extremities: No pitting edema, distal pulses 2+. Skin: Warm and dry. Musculoskeletal: No kyphosis. Neuropsychiatric: Alert and oriented x3, affect grossly appropriate.   ECG: Tracing from 11/19/2014 shows normal sinus rhythm.   Recent Labwork: 11/19/2014: BUN 16; Creatinine, Ser 0.80; Hemoglobin 10.9*; Platelets 389; Potassium 3.9; Sodium 136, troponin I less than 0.03  Other Studies Reviewed Today:  Chest x-ray 11/19/2014: FINDINGS: The heart size and  mediastinal contours are stable. There is no focal infiltrate, pulmonary edema, or pleural effusion. The visualized skeletal structures are unremarkable.  IMPRESSION: No active cardiopulmonary disease.   Assessment and Plan:  1. Precordial pain with typical and atypical features, currently resolved. Recent ER evaluation was overall reassuring, however she does have a family history of significant premature CAD in her brother and mother. No personal history of hypertension or diabetes mellitus. Plan is to pursue further cardiac risk stratification, we will obtain an exercise echocardiogram.  2. Hypothyroidism, on Synthroid.  Current medicines were reviewed with the patient today.   Orders Placed This Encounter  Procedures  . ECHO STRESS TEST    Disposition: Call with results.   Signed, Satira Sark, MD, [facility] 11/27/2014 9:16 AM    [facility] [facility] [place], [place], [place] [address] Phone: [phone]; Fax: [phone]

## 2014-12-10 ENCOUNTER — Ambulatory Visit (HOSPITAL_COMMUNITY)
Admission: RE | Admit: 2014-12-10 | Discharge: 2014-12-10 | Disposition: A | Discharge status: Home / Self Care | Payer: BLUE CROSS/BLUE SHIELD | Source: Ambulatory Visit | Attending: Cardiology | Admitting: Cardiology

## 2014-12-10 DIAGNOSIS — R072 Precordial pain: Secondary | ICD-10-CM

## 2014-12-10 DIAGNOSIS — R079 Chest pain, unspecified: Secondary | ICD-10-CM | POA: Insufficient documentation

## 2014-12-10 HISTORY — PX: OTHER SURGICAL HISTORY: SHX169

## 2014-12-10 LAB — ECHOCARDIOGRAM STRESS TEST
CHL CUP MPHR: 175 {beats}/min
CHL CUP RESTING HR STRESS: 58 {beats}/min
CHL RATE OF PERCEIVED EXERTION: 13
CSEPED: 7 min
CSEPHR: 96 %
Estimated workload: 9.8 METS
Exercise duration (sec): 4 s
Peak HR: 169 {beats}/min

## 2014-12-10 NOTE — Progress Notes (Signed)
Echocardiogram 2D Echocardiogram has been performed.  Lindsey Hughes 12/10/2014, 12:40 PM

## 2014-12-16 ENCOUNTER — Telehealth: Payer: Self-pay | Admitting: *Deleted

## 2014-12-16 NOTE — Telephone Encounter (Signed)
Patient informed. 

## 2014-12-16 NOTE — Telephone Encounter (Signed)
-----   Message from Satira Sark, MD sent at 12/10/2014  3:16 PM EDT ----- Reviewed. Please let her know that the stress test was reassuring overall. No further cardiac testing planned now unless she develops recurring symptoms. Keep follow-up with primary care provider.

## 2015-08-24 ENCOUNTER — Other Ambulatory Visit: Payer: Self-pay | Admitting: Obstetrics

## 2015-08-25 NOTE — Patient Instructions (Addendum)
Your procedure is scheduled on:  Friday, Sep 03, 2015  Enter through the Main Entrance of [facility] at: 11:30 AM  Pick up the phone at the desk and dial [phone].  Call this number if you have problems the morning of surgery: [phone].  Remember:  Do NOT eat food or drink after:  Midnight Thursday  Take these medicines the morning of surgery with a SIP OF WATER:  Levothyroxine, Sertraline, Xanax  Do NOT wear jewelry (body piercing), metal hair clips/bobby pins, make-up, or nail polish. Do NOT wear lotions, powders, or perfumes.  You may wear deodorant. Do NOT shave for 48 hours prior to surgery. Do NOT bring valuables to the hospital. Contacts, dentures, or bridgework may not be worn into surgery.  Leave suitcase in car.  After surgery it may be brought to your room.  For patients admitted to the hospital, checkout time is 11:00 AM the day of discharge.

## 2015-08-27 ENCOUNTER — Encounter (HOSPITAL_COMMUNITY)
Admission: RE | Admit: 2015-08-27 | Discharge: 2015-08-27 | Disposition: A | Discharge status: Home / Self Care | Payer: BLUE CROSS/BLUE SHIELD | Source: Ambulatory Visit | Attending: Obstetrics | Admitting: Obstetrics

## 2015-08-27 ENCOUNTER — Encounter (HOSPITAL_COMMUNITY): Payer: Self-pay

## 2015-08-27 DIAGNOSIS — D649 Anemia, unspecified: Secondary | ICD-10-CM | POA: Insufficient documentation

## 2015-08-27 DIAGNOSIS — Z01812 Encounter for preprocedural laboratory examination: Secondary | ICD-10-CM | POA: Diagnosis not present

## 2015-08-27 DIAGNOSIS — E039 Hypothyroidism, unspecified: Secondary | ICD-10-CM | POA: Insufficient documentation

## 2015-08-27 DIAGNOSIS — F329 Major depressive disorder, single episode, unspecified: Secondary | ICD-10-CM | POA: Diagnosis not present

## 2015-08-27 DIAGNOSIS — N920 Excessive and frequent menstruation with regular cycle: Secondary | ICD-10-CM | POA: Diagnosis not present

## 2015-08-27 HISTORY — DX: Depression, unspecified: F32.A

## 2015-08-27 HISTORY — DX: Anemia, unspecified: D64.9

## 2015-08-27 HISTORY — DX: Major depressive disorder, single episode, unspecified: F32.9

## 2015-08-27 HISTORY — DX: Headache: R51

## 2015-08-27 HISTORY — DX: Headache, unspecified: R51.9

## 2015-08-27 LAB — CBC
HCT: 36.8 % (ref 36.0–46.0)
HEMOGLOBIN: 11.8 g/dL — AB (ref 12.0–15.0)
MCH: 26.5 pg (ref 26.0–34.0)
MCHC: 32.1 g/dL (ref 30.0–36.0)
MCV: 82.7 fL (ref 78.0–100.0)
Platelets: 351 10*3/uL (ref 150–400)
RBC: 4.45 MIL/uL (ref 3.87–5.11)
RDW: 15.7 % — ABNORMAL HIGH (ref 11.5–15.5)
WBC: 6 10*3/uL (ref 4.0–10.5)

## 2015-08-27 LAB — BASIC METABOLIC PANEL
ANION GAP: 9 (ref 5–15)
BUN: 10 mg/dL (ref 6–20)
CALCIUM: 9.4 mg/dL (ref 8.9–10.3)
CHLORIDE: 105 mmol/L (ref 101–111)
CO2: 25 mmol/L (ref 22–32)
Creatinine, Ser: 0.83 mg/dL (ref 0.44–1.00)
GFR calc non Af Amer: >60 mL/min (ref >60)
Glucose, Bld: 96 mg/dL (ref 65–99)
Potassium: 4.2 mmol/L (ref 3.5–5.1)
Sodium: 139 mmol/L (ref 135–145)

## 2015-08-27 LAB — TYPE AND SCREEN
ABO/RH(D): A NEG
Antibody Screen: NEGATIVE

## 2015-08-27 LAB — ABO/RH: ABO/RH(D): A NEG

## 2015-08-30 ENCOUNTER — Other Ambulatory Visit: Payer: Self-pay | Admitting: Obstetrics

## 2015-09-03 ENCOUNTER — Ambulatory Visit (HOSPITAL_COMMUNITY): Payer: BLUE CROSS/BLUE SHIELD | Admitting: Anesthesiology

## 2015-09-03 ENCOUNTER — Encounter (HOSPITAL_COMMUNITY)
Admission: RE | Disposition: A | Discharge status: Home / Self Care | Payer: Self-pay | Source: Ambulatory Visit | Attending: Obstetrics

## 2015-09-03 ENCOUNTER — Ambulatory Visit (HOSPITAL_COMMUNITY)
Admission: RE | Admit: 2015-09-03 | Discharge: 2015-09-03 | Disposition: A | Discharge status: Home / Self Care | Payer: BLUE CROSS/BLUE SHIELD | Source: Ambulatory Visit | Attending: Obstetrics | Admitting: Obstetrics

## 2015-09-03 DIAGNOSIS — N920 Excessive and frequent menstruation with regular cycle: Secondary | ICD-10-CM | POA: Insufficient documentation

## 2015-09-03 DIAGNOSIS — Z6838 Body mass index (BMI) 38.0-38.9, adult: Secondary | ICD-10-CM | POA: Diagnosis not present

## 2015-09-03 DIAGNOSIS — E669 Obesity, unspecified: Secondary | ICD-10-CM | POA: Insufficient documentation

## 2015-09-03 DIAGNOSIS — C539 Malignant neoplasm of cervix uteri, unspecified: Secondary | ICD-10-CM | POA: Diagnosis not present

## 2015-09-03 DIAGNOSIS — N393 Stress incontinence (female) (male): Secondary | ICD-10-CM | POA: Insufficient documentation

## 2015-09-03 DIAGNOSIS — N888 Other specified noninflammatory disorders of cervix uteri: Secondary | ICD-10-CM | POA: Diagnosis not present

## 2015-09-03 DIAGNOSIS — D259 Leiomyoma of uterus, unspecified: Secondary | ICD-10-CM | POA: Insufficient documentation

## 2015-09-03 DIAGNOSIS — D649 Anemia, unspecified: Secondary | ICD-10-CM | POA: Diagnosis not present

## 2015-09-03 DIAGNOSIS — F329 Major depressive disorder, single episode, unspecified: Secondary | ICD-10-CM | POA: Diagnosis not present

## 2015-09-03 DIAGNOSIS — E039 Hypothyroidism, unspecified: Secondary | ICD-10-CM | POA: Diagnosis not present

## 2015-09-03 HISTORY — PX: DILATION AND CURETTAGE OF UTERUS: SHX78

## 2015-09-03 LAB — PREGNANCY, URINE: PREG TEST UR: NEGATIVE

## 2015-09-03 SURGERY — DILATION AND CURETTAGE
Anesthesia: General | Site: Vagina

## 2015-09-03 MED ORDER — SODIUM CHLORIDE 0.9 % IJ SOLN
INTRAMUSCULAR | Status: AC
  Filled 2015-09-03: qty 100

## 2015-09-03 MED ORDER — LIDOCAINE HCL (CARDIAC) 20 MG/ML IV SOLN
INTRAVENOUS | Status: AC
  Filled 2015-09-03: qty 5

## 2015-09-03 MED ORDER — DEXAMETHASONE SODIUM PHOSPHATE 10 MG/ML IJ SOLN
INTRAMUSCULAR | Status: DC | PRN
  Administered 2015-09-03: 10 mg via INTRAVENOUS

## 2015-09-03 MED ORDER — GLYCOPYRROLATE 0.2 MG/ML IJ SOLN
INTRAMUSCULAR | Status: DC | PRN
  Administered 2015-09-03: .4 mg via INTRAVENOUS

## 2015-09-03 MED ORDER — MIDAZOLAM HCL 5 MG/5ML IJ SOLN
INTRAMUSCULAR | Status: DC | PRN
  Administered 2015-09-03: 2 mg via INTRAVENOUS

## 2015-09-03 MED ORDER — NEOSTIGMINE METHYLSULFATE 10 MG/10ML IV SOLN
INTRAVENOUS | Status: DC | PRN
  Administered 2015-09-03: 3 mg via INTRAVENOUS

## 2015-09-03 MED ORDER — FENTANYL CITRATE (PF) 100 MCG/2ML IJ SOLN: 25.0000 ug | INTRAMUSCULAR | Status: DC | PRN

## 2015-09-03 MED ORDER — METOCLOPRAMIDE HCL 5 MG/ML IJ SOLN: 10.0000 mg | Freq: Once | INTRAMUSCULAR | Status: DC | PRN

## 2015-09-03 MED ORDER — ONDANSETRON HCL 4 MG/2ML IJ SOLN
INTRAMUSCULAR | Status: AC
  Filled 2015-09-03: qty 2

## 2015-09-03 MED ORDER — DEXAMETHASONE SODIUM PHOSPHATE 10 MG/ML IJ SOLN
INTRAMUSCULAR | Status: AC
  Filled 2015-09-03: qty 1

## 2015-09-03 MED ORDER — GLYCOPYRROLATE 0.2 MG/ML IJ SOLN
INTRAMUSCULAR | Status: AC
  Filled 2015-09-03: qty 3

## 2015-09-03 MED ORDER — LACTATED RINGERS IV SOLN
INTRAVENOUS | Status: DC
  Administered 2015-09-03 (×2): via INTRAVENOUS

## 2015-09-03 MED ORDER — PROPOFOL 10 MG/ML IV BOLUS
INTRAVENOUS | Status: AC
  Filled 2015-09-03: qty 20

## 2015-09-03 MED ORDER — ARTIFICIAL TEARS OP OINT
TOPICAL_OINTMENT | OPHTHALMIC | Status: DC | PRN
  Administered 2015-09-03: 1 via OPHTHALMIC

## 2015-09-03 MED ORDER — MEPERIDINE HCL 25 MG/ML IJ SOLN: 6.2500 mg | INTRAMUSCULAR | Status: DC | PRN

## 2015-09-03 MED ORDER — CEFAZOLIN SODIUM-DEXTROSE 2-4 GM/100ML-% IV SOLN
2.0000 g | INTRAVENOUS | Status: AC
  Administered 2015-09-03: 2 g via INTRAVENOUS

## 2015-09-03 MED ORDER — SCOPOLAMINE 1 MG/3DAYS TD PT72
MEDICATED_PATCH | TRANSDERMAL | Status: AC
  Administered 2015-09-03: 1.5 mg via TRANSDERMAL
  Filled 2015-09-03: qty 1

## 2015-09-03 MED ORDER — LIDOCAINE HCL 1 % IJ SOLN
INTRAMUSCULAR | Status: AC
  Filled 2015-09-03: qty 20

## 2015-09-03 MED ORDER — ROCURONIUM BROMIDE 100 MG/10ML IV SOLN
INTRAVENOUS | Status: DC | PRN
  Administered 2015-09-03: 50 mg via INTRAVENOUS

## 2015-09-03 MED ORDER — BUPIVACAINE HCL (PF) 0.25 % IJ SOLN
INTRAMUSCULAR | Status: AC
Start: 2015-09-03 — End: 2015-09-03
  Filled 2015-09-03: qty 30

## 2015-09-03 MED ORDER — CEFAZOLIN SODIUM-DEXTROSE 2-3 GM-% IV SOLR
INTRAVENOUS | Status: AC
  Filled 2015-09-03: qty 50

## 2015-09-03 MED ORDER — FENTANYL CITRATE (PF) 250 MCG/5ML IJ SOLN
INTRAMUSCULAR | Status: AC
  Filled 2015-09-03: qty 5

## 2015-09-03 MED ORDER — ROPIVACAINE HCL 5 MG/ML IJ SOLN
INTRAMUSCULAR | Status: AC
  Filled 2015-09-03: qty 30

## 2015-09-03 MED ORDER — LIDOCAINE HCL (CARDIAC) 20 MG/ML IV SOLN
INTRAVENOUS | Status: DC | PRN
  Administered 2015-09-03: 50 mg via INTRAVENOUS

## 2015-09-03 MED ORDER — LACTATED RINGERS IV SOLN: INTRAVENOUS | Status: DC

## 2015-09-03 MED ORDER — SCOPOLAMINE 1 MG/3DAYS TD PT72
1.0000 | MEDICATED_PATCH | Freq: Once | TRANSDERMAL | Status: DC
  Administered 2015-09-03: 1.5 mg via TRANSDERMAL

## 2015-09-03 MED ORDER — FENTANYL CITRATE (PF) 100 MCG/2ML IJ SOLN
INTRAMUSCULAR | Status: DC | PRN
  Administered 2015-09-03: 50 ug via INTRAVENOUS
  Administered 2015-09-03 (×2): 100 ug via INTRAVENOUS

## 2015-09-03 MED ORDER — MIDAZOLAM HCL 2 MG/2ML IJ SOLN
INTRAMUSCULAR | Status: AC
  Filled 2015-09-03: qty 2

## 2015-09-03 MED ORDER — PROPOFOL 10 MG/ML IV BOLUS
INTRAVENOUS | Status: DC | PRN
  Administered 2015-09-03: 170 mg via INTRAVENOUS

## 2015-09-03 MED ORDER — NEOSTIGMINE METHYLSULFATE 10 MG/10ML IV SOLN
INTRAVENOUS | Status: AC
  Filled 2015-09-03: qty 1

## 2015-09-03 MED ORDER — ONDANSETRON HCL 4 MG/2ML IJ SOLN
INTRAMUSCULAR | Status: DC | PRN
  Administered 2015-09-03: 4 mg via INTRAVENOUS

## 2015-09-03 SURGICAL SUPPLY — 71 items
BARRIER ADHS 3X4 INTERCEED (GAUZE/BANDAGES/DRESSINGS) ×5 IMPLANT
BLADE SURG 15 STRL LF C SS BP (BLADE) ×3 IMPLANT
BLADE SURG 15 STRL SS (BLADE) ×2
CANISTER SUCT 3000ML (MISCELLANEOUS) ×5 IMPLANT
CATH FOLEY 3WAY  5CC 16FR (CATHETERS) ×2
CATH FOLEY 3WAY 5CC 16FR (CATHETERS) ×3 IMPLANT
CLOTH BEACON ORANGE TIMEOUT ST (SAFETY) ×5 IMPLANT
CONT PATH 16OZ SNAP LID 3702 (MISCELLANEOUS) ×5 IMPLANT
COVER BACK TABLE 60X90IN (DRAPES) ×10 IMPLANT
COVER TIP SHEARS 8 DVNC (MISCELLANEOUS) ×3 IMPLANT
COVER TIP SHEARS 8MM DA VINCI (MISCELLANEOUS) ×2
DECANTER SPIKE VIAL GLASS SM (MISCELLANEOUS) ×5 IMPLANT
DRSG TELFA 3X8 NADH (GAUZE/BANDAGES/DRESSINGS) ×5 IMPLANT
DURAPREP 26ML APPLICATOR (WOUND CARE) ×5 IMPLANT
ELECT REM PT RETURN 9FT ADLT (ELECTROSURGICAL) ×5
ELECTRODE REM PT RTRN 9FT ADLT (ELECTROSURGICAL) ×3 IMPLANT
GAUZE PACKING 2X5 YD STRL (GAUZE/BANDAGES/DRESSINGS) IMPLANT
GAUZE VASELINE 3X9 (GAUZE/BANDAGES/DRESSINGS) IMPLANT
GLOVE BIO SURGEON STRL SZ 6.5 (GLOVE) ×4 IMPLANT
GLOVE BIO SURGEONS STRL SZ 6.5 (GLOVE) ×1
GLOVE BIOGEL PI IND STRL 6.5 (GLOVE) ×3 IMPLANT
GLOVE BIOGEL PI IND STRL 7.0 (GLOVE) ×12 IMPLANT
GLOVE BIOGEL PI INDICATOR 6.5 (GLOVE) ×2
GLOVE BIOGEL PI INDICATOR 7.0 (GLOVE) ×8
GOWN STRL REUS W/TWL LRG LVL3 (GOWN DISPOSABLE) ×10 IMPLANT
KIT ACCESSORY DA VINCI DISP (KITS) ×2
KIT ACCESSORY DVNC DISP (KITS) ×3 IMPLANT
LEGGING LITHOTOMY PAIR STRL (DRAPES) ×5 IMPLANT
LIQUID BAND (GAUZE/BANDAGES/DRESSINGS) ×10 IMPLANT
NEEDLE HYPO 22GX1.5 SAFETY (NEEDLE) ×5 IMPLANT
NEEDLE SPNL 20GX3.5 QUINCKE YW (NEEDLE) ×10 IMPLANT
NS IRRIG 1000ML POUR BTL (IV SOLUTION) ×5 IMPLANT
OCCLUDER COLPOPNEUMO (BALLOONS) ×5 IMPLANT
PACK ROBOT WH (CUSTOM PROCEDURE TRAY) ×5 IMPLANT
PACK ROBOTIC GOWN (GOWN DISPOSABLE) ×5 IMPLANT
PACK VAGINAL WOMENS (CUSTOM PROCEDURE TRAY) ×5 IMPLANT
PAD PREP 24X48 CUFFED NSTRL (MISCELLANEOUS) ×10 IMPLANT
PAD TRENDELENBURG POSITION (MISCELLANEOUS) ×5 IMPLANT
SET CYSTO W/LG BORE CLAMP LF (SET/KITS/TRAYS/PACK) ×5 IMPLANT
SET IRRIG TUBING LAPAROSCOPIC (IRRIGATION / IRRIGATOR) ×5 IMPLANT
SET TRI-LUMEN FLTR TB AIRSEAL (TUBING) ×5 IMPLANT
SLEEVE XCEL OPT CAN 5 100 (ENDOMECHANICALS) ×5 IMPLANT
SUT VIC AB 0 CT1 27 (SUTURE) ×4
SUT VIC AB 0 CT1 27XBRD ANBCTR (SUTURE) ×6 IMPLANT
SUT VIC AB 2-0 CT1 27 (SUTURE)
SUT VIC AB 2-0 CT1 TAPERPNT 27 (SUTURE) IMPLANT
SUT VIC AB 4-0 PS2 27 (SUTURE) ×10 IMPLANT
SUT VICRYL 0 UR6 27IN ABS (SUTURE) ×10 IMPLANT
SUT VICRYL 4-0 PS2 18IN ABS (SUTURE) IMPLANT
SUT VLOC 180 0 9IN  GS21 (SUTURE) ×2
SUT VLOC 180 0 9IN GS21 (SUTURE) ×3 IMPLANT
SYR 20CC LL (SYRINGE) ×5 IMPLANT
SYR 50ML LL SCALE MARK (SYRINGE) ×5 IMPLANT
SYR BULB IRRIGATION 50ML (SYRINGE) ×5 IMPLANT
SYRINGE 10CC LL (SYRINGE) IMPLANT
SYSTEM CONVERTIBLE TROCAR (TROCAR) ×5 IMPLANT
TIP RUMI ORANGE 6.7MMX12CM (TIP) IMPLANT
TIP UTERINE 5.1X6CM LAV DISP (MISCELLANEOUS) IMPLANT
TIP UTERINE 6.7X10CM GRN DISP (MISCELLANEOUS) IMPLANT
TIP UTERINE 6.7X6CM WHT DISP (MISCELLANEOUS) IMPLANT
TIP UTERINE 6.7X8CM BLUE DISP (MISCELLANEOUS) ×5 IMPLANT
TOWEL OR 17X24 6PK STRL BLUE (TOWEL DISPOSABLE) ×15 IMPLANT
TRAY FOLEY BAG SILVER LF 16FR (SET/KITS/TRAYS/PACK) IMPLANT
TROCAR 12M 150ML BLUNT (TROCAR) ×5 IMPLANT
TROCAR DISP BLADELESS 8 DVNC (TROCAR) ×6 IMPLANT
TROCAR DISP BLADELESS 8MM (TROCAR) ×4
TROCAR HASSON GELL 12X100 (TROCAR) IMPLANT
TROCAR PORT AIRSEAL 5X120 (TROCAR) ×5 IMPLANT
TROCAR XCEL 12X100 BLDLESS (ENDOMECHANICALS) ×5 IMPLANT
TROCAR XCEL NON-BLD 5MMX100MML (ENDOMECHANICALS) ×5 IMPLANT
WATER STERILE IRR 1000ML POUR (IV SOLUTION) ×15 IMPLANT

## 2015-09-03 NOTE — Transfer of Care (Signed)
Immediate Anesthesia Transfer of Care Note  Patient: Lindsey Hughes  Procedure(s) Performed: Procedure(s): EXAM UNDER ANESTHESIA  WITH FROZEN SECTION CERVICAL BIOPSY & ENDOCERVICAL CURRETTINGS (N/A)  Patient Location: PACU  Anesthesia Type:General  Level of Consciousness: awake, alert  and oriented  Airway & Oxygen Therapy: Patient Spontanous Breathing and Patient connected to nasal cannula oxygen  Post-op Assessment: Report given to RN and Post -op Vital signs reviewed and stable  Post vital signs: Reviewed and stable  Last Vitals:  Filed Vitals:   09/03/15 1036  BP: 122/81  Pulse: 64  Temp: 36.6 C  Resp: 20    Last Pain:  Filed Vitals:   09/03/15 1039  PainSc: 3       Patients Stated Pain Goal: 3 ([id number])  Complications: No apparent anesthesia complications

## 2015-09-03 NOTE — Brief Op Note (Signed)
09/03/2015  4:20 PM  PATIENT:  Lindsey Hughes  45 y.o. female  PRE-OPERATIVE DIAGNOSIS:  Menorrhagia, Uterine Fibroids STRESS INCONTINENCE  POST-OPERATIVE DIAGNOSIS:  menorrhagia, uterine fibroids, stress incontinence, squamous cell cervical cancer  PROCEDURE:  Procedure(s): EXAM UNDER ANESTHESIA  WITH FROZEN SECTION CERVICAL BIOPSY & ENDOCERVICAL CURRETTINGS (N/A)  SURGEON:  Surgeon(s) and Role:    * Aloha Gell, MD - Primary  PHYSICIAN ASSISTANT:   ASSISTANTSBenjie Karvonen, M.D.   ANESTHESIA:   general  EBL:  Total I/O In: 1300 [I.V.:1300] Out: 40 [Urine:30; Blood:50]  BLOOD ADMINISTERED:none  DRAINS: none   LOCAL MEDICATIONS USED:  NONE  SPECIMEN:  Source of Specimen:  Cervical biopsy 4, endocervical curettage  DISPOSITION OF SPECIMEN:  PATHOLOGY  COUNTS:  YES  TOURNIQUET:  * No tourniquets in log *  DICTATION: .Note written in EPIC  PLAN OF CARE: Discharge to home after PACU  PATIENT DISPOSITION:  PACU - hemodynamically stable.   Delay start of Pharmacological VTE agent (>24hrs) due to surgical blood loss or risk of bleeding: yes

## 2015-09-03 NOTE — Anesthesia Postprocedure Evaluation (Signed)
Anesthesia Post Note  Patient: Lindsey Hughes  Procedure(s) Performed: Procedure(s) (LRB): EXAM UNDER ANESTHESIA  WITH FROZEN SECTION CERVICAL BIOPSY & ENDOCERVICAL CURRETTINGS (N/A)  Patient location during evaluation: PACU Anesthesia Type: General Level of consciousness: awake and alert Pain management: pain level controlled Vital Signs Assessment: post-procedure vital signs reviewed and stable Respiratory status: spontaneous breathing, nonlabored ventilation, respiratory function stable and patient connected to nasal cannula oxygen Cardiovascular status: blood pressure returned to baseline and stable Postop Assessment: no signs of nausea or vomiting Anesthetic complications: no     Last Vitals:  Filed Vitals:   09/03/15 1036 09/03/15 1536  BP: 122/81   Pulse: 64   Temp: 36.6 C 37 C  Resp: 20 20    Last Pain:  Filed Vitals:   09/03/15 1546  PainSc: 3    Pain Goal: Patients Stated Pain Goal: 3 (09/03/15 1036)               Montez Hageman

## 2015-09-03 NOTE — Op Note (Signed)
09/03/2015  4:20 PM  PATIENT:  Lindsey Hughes  45 y.o. female  PRE-OPERATIVE DIAGNOSIS:  Menorrhagia, Uterine Fibroids STRESS INCONTINENCE  POST-OPERATIVE DIAGNOSIS:  menorrhagia, uterine fibroids, stress incontinence, squamous cell cervical cancer  PROCEDURE:  Procedure(s): EXAM UNDER ANESTHESIA  WITH FROZEN SECTION CERVICAL BIOPSY & ENDOCERVICAL CURRETTINGS (N/A)  SURGEON:  Surgeon(s) and Role:    * Aloha Gell, MD - Primary  PHYSICIAN ASSISTANT:   ASSISTANTSBenjie Karvonen, M.D.   ANESTHESIA:   general  EBL:  Total I/O In: 1300 [I.V.:1300] Out: 72 [Urine:30; Blood:50]  BLOOD ADMINISTERED:none  DRAINS: none   LOCAL MEDICATIONS USED:  NONE  SPECIMEN:  Source of Specimen:  Cervical biopsy 4, endocervical curettage  DISPOSITION OF SPECIMEN:  PATHOLOGY  COUNTS:  YES  TOURNIQUET:  * No tourniquets in log *  DICTATION: .Note written in EPIC  PLAN OF CARE: Discharge to home after PACU  PATIENT DISPOSITION:  PACU - hemodynamically stable.   Delay start of Pharmacological VTE agent (>24hrs) due to surgical blood loss or risk of bleeding: yes  Antibiotics: 2 g of Ancef EBL: 50 cc  Findings: Uterine sound to 7-1/2 cm, normal anterior cervical lip, posterior cervical lip friable and necrotic with abnormal vasculature and retracted posteriorly with adhesions to the posterior vaginal wall  Indications 45 year old G1 P1 with several month history of irregular bleeding nonresponsive to hormonal control who desired definitive management by hysterectomy. Patient had workup that included reported normal Pap smear from outside provider a 9 cm uterine height with a 10 mm endometrial stripe. Several office evaluations for attempted sonohysterogram were unsuccessful to further evaluate the 10 mm endometrial stripe but a endometrial biopsy was shown to have proliferative endometrium. Pap smear that returned on the day of surgery was ASCUS/H with high-risk HPV positive  Procedure: After  informed consent was obtained the patient was taken to the operating room where general anesthesia was initiated without difficulty she is prepped and draped in normal sterile fashion the dorsal supine lithotomy position. If three-way Foley catheter was inserted sterilely into her bladder. A bimanual exam was limited by obesity but consistent with an 9 cm size uterus. On bimanual exam the anterior cervical lip was easily palpable but the posterior cervical lip was nodular and flush with the posterior cervical wall. The case was begun in standard fashion with plans to introduce a uterine manipulator. A large Graves speculum was used to evaluate the cervix but the posterior cervical wall was flush with the vaginal posterior wall and was difficult to see. Continued manipulation with the speculum was unsuccessful in fully evaluating the cervical lip. With nurses holding anterior and posterior vaginal wall retractors and with using both a tenaculum on the anterior cervical lip and a sponge stick to further retract vaginal walls I was eventually able to clearly see the posterior cervical lip at this point. Abundant necrotic tissue with abnormal vasculature was noted. Several attempts to grasp the posterior cervical lip with a tenaculum were unsuccessful as the tissue would tear. I was unable to pull the posterior cervical wall away from the posterior vaginal wall as the necrotic tissue was consuming the upper posterior vagina.  Clinical concern for cervical cancer was had at this point. Several cervical biopsies were obtained. Of note the tissue easily pulled away. A sharp ECC was also performed. The decision was made to abandon the case after discussing the probable findings with the GYN oncologist. The frozen pathology came back as squamous cell cancer.  Bimanual exam and rectovaginal exam confirms  absence of mass in the parametrium. However the cervical lesion consumed from 3:00 to 9:00 the entire posterior cervix  spanning a with a greater than 2 cm. The distal posterior vaginal wall seem to be included in the necrotic lesion. Patient was woken from general anesthesia having tolerated the procedure well. Several repeat speculum exam revealed no active bleeding. Patient was taken the recovery room in stable condition. Sponge lap needle counts were correct 3.  Odelia Graciano A. 09/03/2015 4:34 PM

## 2015-09-03 NOTE — Anesthesia Preprocedure Evaluation (Addendum)
Anesthesia Evaluation  Patient identified by MRN, date of birth, ID band Patient awake    Reviewed: Allergy & Precautions, NPO status , Patient's Chart, lab work & pertinent test results  Airway Mallampati: II  TM Distance: >3 FB Neck ROM: Full    Dental no notable dental hx.    Pulmonary neg pulmonary ROS,    Pulmonary exam normal breath sounds clear to auscultation       Cardiovascular negative cardio ROS Normal cardiovascular exam Rhythm:Regular Rate:Normal     Neuro/Psych negative neurological ROS  negative psych ROS   GI/Hepatic negative GI ROS, Neg liver ROS,   Endo/Other  negative endocrine ROS  Renal/GU negative Renal ROS  negative genitourinary   Musculoskeletal negative musculoskeletal ROS (+)   Abdominal   Peds negative pediatric ROS (+)  Hematology negative hematology ROS (+)   Anesthesia Other Findings   Reproductive/Obstetrics negative OB ROS                             Anesthesia Physical Anesthesia Plan  ASA: II  Anesthesia Plan: General   Post-op Pain Management:    Induction: Intravenous  Airway Management Planned: Oral ETT  Additional Equipment:   Intra-op Plan:   Post-operative Plan: Extubation in OR  Informed Consent: I have reviewed the patients History and Physical, chart, labs and discussed the procedure including the risks, benefits and alternatives for the proposed anesthesia with the patient or authorized representative who has indicated his/her understanding and acceptance.   Dental advisory given  Plan Discussed with: CRNA  Anesthesia Plan Comments:         Anesthesia Quick Evaluation  

## 2015-09-03 NOTE — OR Nursing (Signed)
Dr. Saralyn Pilar called at [facility] to come to [facility] for frozen section. Frozen section cervical biopsy sent to pathology at 1455. Results called by Dr. Saralyn Pilar at [phone]

## 2015-09-03 NOTE — Discharge Instructions (Signed)

## 2015-09-03 NOTE — H&P (Signed)
See paper H&P in scanned docs.   CC: menorrhagia, anemia  HPI: 45 yo G1P1 with menorrhagia unresponsive to hormonal control. Nl embx. U/s: 9x5x5. Pt also with bothersome stress incontinence  Past Medical History  Diagnosis Date  . Hypothyroidism   . Anemia   . Depression   . Headache     Past Surgical History  Procedure Laterality Date  . Cesarean section  1991   . Carpal tunnel release Bilateral 2002 and 2009  . Tubal ligation    . Lasik Left     Meds: iron, synthroid  NKDA  PE: Filed Vitals:   09/03/15 1036  BP: 122/81  Pulse: 64  Temp: 97.8 F (36.6 C)  TempSrc: Oral  Resp: 20  SpO2: 100%   Gen: obese, no distress Abd: obese, NT GU: def to OR LE: NT, no edema  CBC    Component Value Date/Time   WBC 6.0 08/27/2015 1245   RBC 4.45 08/27/2015 1245   HGB 11.8* 08/27/2015 1245   HCT 36.8 08/27/2015 1245   PLT 351 08/27/2015 1245   MCV 82.7 08/27/2015 1245   MCH 26.5 08/27/2015 1245   MCHC 32.1 08/27/2015 1245   RDW 15.7* 08/27/2015 1245   LYMPHSABS 1.9 07/15/2010 1825   MONOABS 0.5 07/15/2010 1825   EOSABS 0.1 07/15/2010 1825   BASOSABS 0.0 07/15/2010 1825     A/P: robotic assisted TLH with b/l salpingectomy witth ovarian retention. If abnl ovaries, pt consents to removal  TVT sling. R/b d/w pt. Aware of risks of temporary urinary retention  Lindsey Hughes A. 09/03/2015 1:54 PM

## 2015-09-03 NOTE — Anesthesia Procedure Notes (Signed)
Procedure Name: Intubation Date/Time: 09/03/2015 2:08 PM Performed by: Ignacia Bayley Pre-anesthesia Checklist: Patient identified, Emergency Drugs available, Suction available and Patient being monitored Patient Re-evaluated:Patient Re-evaluated prior to inductionOxygen Delivery Method: Circle system utilized Preoxygenation: Pre-oxygenation with 100% oxygen Intubation Type: IV induction Ventilation: Mask ventilation without difficulty Laryngoscope Size: Miller and 2 Grade View: Grade I Tube type: Oral Tube size: 7.0 mm Number of attempts: 1 Airway Equipment and Method: Stylet Placement Confirmation: ETT inserted through vocal cords under direct vision,  positive ETCO2 and breath sounds checked- equal and bilateral Secured at: 20 cm Tube secured with: Tape Dental Injury: Teeth and Oropharynx as per pre-operative assessment

## 2015-09-06 NOTE — Addendum Note (Signed)
Addendum  created 09/06/15 1259 by Laverle Hobby, CRNA   Modules edited: Charges VN

## 2015-09-07 ENCOUNTER — Encounter (HOSPITAL_COMMUNITY): Payer: Self-pay | Admitting: Obstetrics

## 2015-09-10 ENCOUNTER — Other Ambulatory Visit (HOSPITAL_COMMUNITY): Payer: Self-pay | Admitting: Obstetrics

## 2015-09-10 DIAGNOSIS — C539 Malignant neoplasm of cervix uteri, unspecified: Secondary | ICD-10-CM

## 2015-09-14 ENCOUNTER — Ambulatory Visit (HOSPITAL_COMMUNITY): Payer: BLUE CROSS/BLUE SHIELD

## 2015-09-16 ENCOUNTER — Encounter: Payer: Self-pay | Admitting: Gynecologic Oncology

## 2015-09-16 ENCOUNTER — Other Ambulatory Visit: Payer: Self-pay | Admitting: Oncology

## 2015-09-16 ENCOUNTER — Ambulatory Visit: Payer: BLUE CROSS/BLUE SHIELD | Attending: Gynecologic Oncology | Admitting: Gynecologic Oncology

## 2015-09-16 VITALS — BP 118/73 | HR 86 | Temp 97.9°F | Resp 18 | Ht 62.0 in | Wt 211.1 lb

## 2015-09-16 DIAGNOSIS — R51 Headache: Secondary | ICD-10-CM | POA: Insufficient documentation

## 2015-09-16 DIAGNOSIS — C539 Malignant neoplasm of cervix uteri, unspecified: Secondary | ICD-10-CM | POA: Insufficient documentation

## 2015-09-16 DIAGNOSIS — E669 Obesity, unspecified: Secondary | ICD-10-CM

## 2015-09-16 DIAGNOSIS — E039 Hypothyroidism, unspecified: Secondary | ICD-10-CM | POA: Insufficient documentation

## 2015-09-16 DIAGNOSIS — Z8249 Family history of ischemic heart disease and other diseases of the circulatory system: Secondary | ICD-10-CM | POA: Diagnosis not present

## 2015-09-16 DIAGNOSIS — Z923 Personal history of irradiation: Secondary | ICD-10-CM | POA: Insufficient documentation

## 2015-09-16 DIAGNOSIS — D500 Iron deficiency anemia secondary to blood loss (chronic): Secondary | ICD-10-CM

## 2015-09-16 DIAGNOSIS — R32 Unspecified urinary incontinence: Secondary | ICD-10-CM | POA: Insufficient documentation

## 2015-09-16 DIAGNOSIS — M549 Dorsalgia, unspecified: Secondary | ICD-10-CM | POA: Diagnosis not present

## 2015-09-16 DIAGNOSIS — C531 Malignant neoplasm of exocervix: Secondary | ICD-10-CM

## 2015-09-16 DIAGNOSIS — Z9889 Other specified postprocedural states: Secondary | ICD-10-CM | POA: Insufficient documentation

## 2015-09-16 DIAGNOSIS — M7989 Other specified soft tissue disorders: Secondary | ICD-10-CM | POA: Insufficient documentation

## 2015-09-16 DIAGNOSIS — Z801 Family history of malignant neoplasm of trachea, bronchus and lung: Secondary | ICD-10-CM | POA: Diagnosis not present

## 2015-09-16 NOTE — Patient Instructions (Signed)
We will call you with the results of your PET scan tomorrow.  Plan to meet with Dr. Evlyn Clines, Medical Oncologist, to discuss chemotherapy and Dr. Gery Pray, Radiation Oncologist, to discuss radiation.  Please call our office for any questions or concerns.  Cervical Cancer  The cervix is the opening and bottom part of the uterus between the vagina and the uterus. Cervical cancer is a fairly common cancer. It occurs most often in women between the ages of 33 years and 80 years. Cells of the cervix act very much like skin cells. These cells are exposed to toxins, viruses, and bacteria that may cause abnormal changes.  There are two kinds of cancers of the cervix:   Squamous cell carcinoma. This type of cancer starts in the flat or scale-like cells that line the cervix. Squamous cell carcinoma can develop from a sexually transmitted infection caused by the human papillomavirus (HPV).  Adenocarcinoma. This type of cervical cancer starts in glandular cells that line the cervix. RISK FACTORS The risk of getting cancer of the cervix is related to your lifestyle, sexual history, health, and immune system. Risks for cervical cancer include:   Having a sexually transmitted viral infection. These include:  Chlamydia.   Herpes.   HPV.  Becoming sexually active before age 24 years.  Having more than one sexual partner or having sex with someone who has more than one sexual partner.  Not using condoms with sexual partners.  Having had cancer of the vagina or vulva.  Having a sexual partner who has or had cancer of the penis or who has had a sexual partner with abnormal cervical cells (dysplasia) or cervical cancer.  Using oral contraceptives (also called birth control pills).  Smoking.   Having a weakened immune system. For example, human immunodeficiency virus (HIV) or other immune deficiency disorders.  Being the daughter of a woman who took diethylstilbestrol (DES) during  pregnancy.  Having a sister or mother who has had cancer of the cervix.  Being [place] American, Hispanic, Asian, or a woman from the [place].  A history of dysplasia of the cervix. SIGNS AND SYMPTOMS  Symptoms are usually not present in the early stages of cervical cancer. Once the cancer invades the cervix and surrounding tissues, the woman may have:   Abnormal vaginal bleeding or menstrual bleeding that is longer or heavier than usual.  Bleeding after intercourse, douching, or a Pap test.  Vaginal bleeding following menopause.  Abnormal vaginal discharge.  Pelvic discomfort or pain.  An abnormal Pap test.  Pain during sexual intercourse. Symptoms of more advanced cervical cancer may include:   Loss of appetite or weight loss.  Tiredness (fatigue).  Back and leg pain.  Inability to control urination or bowel movements. DIAGNOSIS  A pelvic exam and Pap test are done to diagnose the condition. If abnormalities are found during the exam or Pap test, the Pap test may be repeated in 3 months, or your health care provider may do additional tests or procedures, such as:   A colposcopy. This is a procedure that uses a special microscope that allows the health care provider to magnify and closely examine the cells of the cervix, vagina, and vulva.  Cervical biopsies. This is a procedure where small tissue samples are taken from the cervix to be examined under a microscope by a specialist.   A cone biopsy. This is a procedure to test for or remove cancerous tissue. Other tests may be needed, including:  Cystoscopy.   Proctoscopy or sigmoidoscopy.  Ultrasound.   CT scan.   MRI.   Laparoscopy.  There are different stages of cervical cancer:   Stage 0, carcinoma in situ (CIS)--This first stage of cancer is the last and most serious stage of dysplasia.  Stage I--This means the tumor is in the uterus and cervix only.  Stage II--This means the tumor has  spread to the upper vagina. The cancer has spread beyond the uterus but not to the pelvic walls or lower third of the vagina.  Stage III--This means the tumor has invaded the side wall of the pelvis and the lower third of the vagina. If the tumor blocks the tubes that carry urine to the bladder (ureters), it may cause urine to back up and the kidneys to swell (hydronephrosis).  Stage IV--This means the tumor has spread to the rectum or bladder. In the later part of this stage, it has also spread to distant organs, like the lungs. TREATMENT  Treatment options can include:   Cone biopsy to remove the cancerous tissue.   Removal of the entire uterus and cervix.   Removal of the uterus, cervix, upper vagina, lymph nodes, and surrounding tissue (modified radical hysterectomy). The ovaries may be left in place or removed.  Medicines to treat cancer.   A combination of surgery, radiation, and chemotherapy.   Biological response modifiers. These are substances that help strengthen your immune system's fight against cancer or infection. They may be used in combination with chemotherapy. HOME CARE INSTRUCTIONS   Get a gynecology exam and Pap test once every year or as directed by your health care provider.   Get the HPV vaccine.   Do not smoke.  Do not have sexual intercourse until your health care provider says it is okay.  Use a condom every time you have sex. SEEK MEDICAL CARE IF:   You have increased pelvic pain or pressure.   Your are becoming increasingly tired.   You have increased leg or back pain.   You have a fever.  You have abnormal bleeding or discharge.  You lose weight. SEEK IMMEDIATE MEDICAL CARE IF:   You cannot urinate.  You have blood in your urine.   You have blood or pressure with a bowel movement.   You develop severe back, stomach, or pelvic pain.   This information is not intended to replace advice given to you by your health care  provider. Make sure you discuss any questions you have with your health care provider.   Document Released: 04/10/2005 Document Revised: 04/15/2013 Document Reviewed: 10/02/2012 Elsevier Interactive Patient Education [employer].

## 2015-09-16 NOTE — Progress Notes (Signed)
Consult Note: Gyn-Onc  Consult was requested by Dr. Pamala Hurry for the evaluation of Lindsey Hughes 45 y.o. female  CC:  Chief Complaint  Patient presents with  . New Patient (Initial Visit)    cervical cancer    Assessment/Plan:  [name]  is a 45 y.o.  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   The posterior lip of her cervix is replaced by what is palpably a 6cm tumor and it is beginning to infiltrate into the parametrial tissues bilaterally.   I am recommending primary chemorardiation. I discussed what this will involve and anticipated success rates.  Her staging PET/CT is scheduled for tomorrow.  We have made referrals to Dr Sondra Come from Radiation Oncology and Dr Marko Plume from Medical Oncology.  HPI: Lindsey Hughes is a very pleasant 45 year old who is seen in consultation at the request of Dr Pamala Hurry for cervical cancer.  The patient reports a 4 month history of peristent daily vaginal bleeding. She has also had back pain and more recently random urinary incontinence.  She cannot remember when she last had a pap smear (5-10 years ago) but she does recall having a history of abnormal paps in the past, though was not treated for these and feels that she "was never told they needed treatment".   As workup for her bleeding she saw Dr Pamala Hurry. An office endometrial biopsy was performed on 08/12/15 and revealed benign, weakly proliferative endoemetrium. During that office exam the posterior lip of the cervix was not visualized due to positioning of the uterus and body habitus. Passage of the pipelle was difficult due to obstruction at the endocervix. Pap was taken but was not resulted by the time of her scheduled hysterectomy. It later returned as ASC-H, positive for high risk HPV.  The patient was scheduled for a robotic hysterectomy and on 09/03/15 was taken to the OR. Intraoperative findings were significant for a very  abnormal posterior lip of cervix which was nodular and friable. The cervix could be visualized intra-op under general anesthetic. 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 The procedure was aborted and a referral was made to [facility].  The patient is otherwise quite healthy. She has a prior operative history of a cesarean section and a tubal ligation. She has never smoked. 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  Her symptoms are predominantly back pain, incontinence, and right knee edema.  Current Meds:  Outpatient Encounter Prescriptions as of 09/16/2015  Medication Sig  . ferrous fumarate (HEMOCYTE - 106 MG FE) 325 (106 Fe) MG TABS tablet Take 1 tablet by mouth.  . levothyroxine (SYNTHROID, LEVOTHROID) 50 MCG tablet Take 50 mcg by mouth daily.  Marland Kitchen trimethoprim (TRIMPEX) 100 MG tablet   . ALPRAZolam (XANAX) 0.25 MG tablet Take 0.25 mg by mouth as needed for anxiety. Reported on 09/16/2015  . ibuprofen (ADVIL,MOTRIN) 800 MG tablet Take 800 mg by mouth every 8 (eight) hours as needed for mild pain or moderate pain. Reported on 09/16/2015  . [DISCONTINUED] aspirin 81 MG tablet Take 81 mg by mouth daily. Reported on 08/24/2015  . [DISCONTINUED] sertraline (ZOLOFT) 25 MG tablet Take 25 mg by mouth daily.   No facility-administered encounter medications on file as of 09/16/2015.    Allergy: No Known Allergies  Social Hx:   Social History   Social History  . Marital Status: Married  Spouse Name: N/A  . Number of Children: N/A  . Years of Education: N/A   Occupational History  . Not on file.   Social History Main Topics  . Smoking status: Never Smoker   . Smokeless tobacco: Never Used  . Alcohol Use: No  . Drug Use: No  . Sexual Activity: Yes    Birth Control/ Protection: Surgical   Other Topics Concern  . Not on file   Social History Narrative    Past Surgical Hx:  Past Surgical  History  Procedure Laterality Date  . Cesarean section  1991   . Carpal tunnel release Bilateral 2002 and 2009  . Tubal ligation    . Lasik Left   . Dilation and curettage of uterus N/A 09/03/2015    Procedure: EXAM UNDER ANESTHESIA  WITH FROZEN SECTION CERVICAL BIOPSY & ENDOCERVICAL CURRETTINGS;  Surgeon: Aloha Gell, MD;  Location: [facility];  Service: Gynecology;  Laterality: N/A;    Past Medical Hx:  Past Medical History  Diagnosis Date  . Hypothyroidism   . Anemia   . Depression   . Headache     Past Gynecological History:  G1P1, + abnormal pap smears remotely. No recent pap history in 5-10 years.  No LMP recorded.  Family Hx:  Family History  Problem Relation Age of Onset  . CAD Brother     MI in his 35s  . CAD Mother     Premature disease  . Lung cancer Father   . Brain cancer Father     Review of Systems:  Constitutional  Feels well,    ENT Normal appearing ears and nares bilaterally Skin/Breast  No rash, sores, jaundice, itching, dryness Cardiovascular  No chest pain, shortness of breath, or edema  Pulmonary  No cough or wheeze.  Gastro Intestinal  No nausea, vomitting, or diarrhoea. No bright red blood per rectum, no abdominal pain, change in bowel movement, or constipation.  Genito Urinary  No frequency, urgency, dysuria, + abnormal uterine bleeding. Musculo Skeletal  No myalgia, arthralgia, + right knee swelling  Neurologic  No weakness, numbness, change in gait,  Psychology  No depression, anxiety, insomnia.   Vitals:  Blood pressure 118/73, pulse 86, temperature 97.9 F (36.6 C), temperature source Oral, resp. rate 18, height 5\' 2"  (1.575 m), weight 211 lb 1.6 oz (95.754 kg), SpO2 100 %.  Physical Exam: WD in NAD Neck  Supple NROM, without any enlargements.  Lymph Node Survey No cervical supraclavicular or inguinal adenopathy Cardiovascular  Pulse normal rate, regularity and rhythm. S1 and S2 normal.  Lungs  Clear to auscultation  bilateraly, without wheezes/crackles/rhonchi. Good air movement.  Skin  No rash/lesions/breakdown  Psychiatry  Alert and oriented to person, place, and time  Abdomen  Normoactive bowel sounds, abdomen soft, non-tender and obese without evidence of hernia.  Back No CVA tenderness Genito Urinary  Vulva/vagina: Normal external female genitalia.  No lesions. No discharge or bleeding.  Bladder/urethra:  No lesions or masses, well supported bladder  Vagina: normal with no lesions  Cervix: Anterior lip of cervix visibly normal, posterior lip not visible on speculum exam. The cervix is enlarged, and the posterior lip is nodular and firm and 6cm. It is flush with the posterior vagina but does not appear to involve the vagina.  Uterus: 10cm minimally mobile, + bilateral parametrial involvement but not to sidewalls  Adnexa: no palpable masses. Rectal  Good tone, no masses no cul de sac nodularity. + parametrial extension minimally bilateraly Extremities  No  bilateral cyanosis, clubbing or edema.   Donaciano Eva, MD  09/16/2015, 2:47 PM

## 2015-09-17 ENCOUNTER — Encounter (HOSPITAL_COMMUNITY)
Admission: RE | Admit: 2015-09-17 | Discharge: 2015-09-17 | Disposition: A | Discharge status: Home / Self Care | Payer: BLUE CROSS/BLUE SHIELD | Source: Ambulatory Visit | Attending: Obstetrics | Admitting: Obstetrics

## 2015-09-17 DIAGNOSIS — C539 Malignant neoplasm of cervix uteri, unspecified: Secondary | ICD-10-CM | POA: Diagnosis present

## 2015-09-17 LAB — GLUCOSE, CAPILLARY: Glucose-Capillary: 96 mg/dL (ref 65–99)

## 2015-09-17 MED ORDER — FLUDEOXYGLUCOSE F - 18 (FDG) INJECTION
10.5200 | Freq: Once | INTRAVENOUS | Status: AC | PRN
  Administered 2015-09-17: 10.52 via INTRAVENOUS

## 2015-09-17 NOTE — Progress Notes (Addendum)
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 She has also had back pain and more recently random urinary incontinence.  Biopsies revealed:   09/03/15 Diagnosis 1. Cervix, biopsy - INVASIVE POORLY DIFFERENTIATED SQUAMOUS CELL CARCINOMA. 2. Endocervix, curettage - INVASIVE POORLY DIFFERENTIATED SQUAMOUS CELL CARICNOMA. - FOCAL VASCULAR INVASION BY TUMOR.  Past/Anticipated interventions by Gyn/Onc surgery, if any: no  Past/Anticipated interventions by medical oncology, if any: Dr. Marko Plume 09/23/15.  Dr. Denman George is "recommending primary chemorardiation."  Weight changes, if any: no  Bowel/Bladder complaints, if any: Yes.  , urinary incontinence that has worsened over the past 2 months, also bladder pressure.  Denies having bowel issues.  Nausea/Vomiting, if any: no  Pain issues, if any:  Yes has pain in her lower back that she is rating at a 4/10.  She reports the pain started 2 months ago.  She is taking 2 ibuprofen per day.  SAFETY ISSUES:  Prior radiation? no  Pacemaker/ICD? no  Possible current pregnancy? no  Is the patient on methotrexate? no  Current Complaints / other details:  Patient reports having light vaginal spotting.  Patient prefers morning appointments because she has a stepdaughter with kidney failure.  BP 126/84 mmHg  Pulse 81  Temp(Src) 98.2 F (36.8 C) (Oral)  Resp 16  Ht 5\' 2"  (1.575 m)  Wt 207 lb 6.4 oz (94.076 kg)  BMI 37.92 kg/m2  SpO2 100%   Wt Readings from Last 3 Encounters:  09/21/15 207 lb 6.4 oz (94.076 kg)  09/16/15 211 lb 1.6 oz (95.754 kg)  08/27/15 209 lb 8 oz (95.029 kg)

## 2015-09-20 ENCOUNTER — Other Ambulatory Visit: Payer: Self-pay | Admitting: Oncology

## 2015-09-21 ENCOUNTER — Ambulatory Visit
Admission: RE | Admit: 2015-09-21 | Discharge: 2015-09-21 | Disposition: A | Discharge status: Home / Self Care | Payer: BLUE CROSS/BLUE SHIELD | Source: Ambulatory Visit | Attending: Radiation Oncology | Admitting: Radiation Oncology

## 2015-09-21 ENCOUNTER — Encounter: Payer: Self-pay | Admitting: *Deleted

## 2015-09-21 ENCOUNTER — Other Ambulatory Visit: Payer: BLUE CROSS/BLUE SHIELD

## 2015-09-21 ENCOUNTER — Encounter: Payer: Self-pay | Admitting: Radiation Oncology

## 2015-09-21 ENCOUNTER — Ambulatory Visit: Payer: BLUE CROSS/BLUE SHIELD | Admitting: Oncology

## 2015-09-21 ENCOUNTER — Telehealth: Payer: Self-pay | Admitting: Oncology

## 2015-09-21 VITALS — BP 126/84 | HR 81 | Temp 98.2°F | Resp 16 | Ht 62.0 in | Wt 207.4 lb

## 2015-09-21 DIAGNOSIS — D500 Iron deficiency anemia secondary to blood loss (chronic): Secondary | ICD-10-CM | POA: Diagnosis not present

## 2015-09-21 DIAGNOSIS — Z510 Encounter for antineoplastic radiation therapy: Secondary | ICD-10-CM | POA: Diagnosis not present

## 2015-09-21 DIAGNOSIS — C531 Malignant neoplasm of exocervix: Secondary | ICD-10-CM | POA: Diagnosis not present

## 2015-09-21 LAB — COMPREHENSIVE METABOLIC PANEL
ALBUMIN: 3.8 g/dL (ref 3.5–5.0)
ALK PHOS: 61 U/L (ref 40–150)
ALT: 20 U/L (ref 0–55)
AST: 20 U/L (ref 5–34)
Anion Gap: 9 mEq/L (ref 3–11)
BILIRUBIN TOTAL: 0.6 mg/dL (ref 0.20–1.20)
BUN: 12.7 mg/dL (ref 7.0–26.0)
CALCIUM: 9.1 mg/dL (ref 8.4–10.4)
CO2: 26 mEq/L (ref 22–29)
Chloride: 106 mEq/L (ref 98–109)
Creatinine: 0.8 mg/dL (ref 0.6–1.1)
EGFR: 85 mL/min/{1.73_m2} — AB (ref >90)
GLUCOSE: 94 mg/dL (ref 70–140)
Potassium: 3.6 mEq/L (ref 3.5–5.1)
SODIUM: 141 meq/L (ref 136–145)
TOTAL PROTEIN: 7.3 g/dL (ref 6.4–8.3)

## 2015-09-21 LAB — CBC WITH DIFFERENTIAL/PLATELET
BASO%: 1.1 % (ref 0.0–2.0)
BASOS ABS: 0.1 10*3/uL (ref 0.0–0.1)
EOS ABS: 0.2 10*3/uL (ref 0.0–0.5)
EOS%: 3.6 % (ref 0.0–7.0)
HEMATOCRIT: 37.7 % (ref 34.8–46.6)
HEMOGLOBIN: 12.3 g/dL (ref 11.6–15.9)
LYMPH#: 1.7 10*3/uL (ref 0.9–3.3)
LYMPH%: 28.7 % (ref 14.0–49.7)
MCH: 26.9 pg (ref 25.1–34.0)
MCHC: 32.6 g/dL (ref 31.5–36.0)
MCV: 82.5 fL (ref 79.5–101.0)
MONO#: 0.5 10*3/uL (ref 0.1–0.9)
MONO%: 7.5 % (ref 0.0–14.0)
NEUT%: 59.1 % (ref 38.4–76.8)
NEUTROS ABS: 3.6 10*3/uL (ref 1.5–6.5)
Platelets: 356 10*3/uL (ref 145–400)
RBC: 4.57 10*6/uL (ref 3.70–5.45)
RDW: 15.4 % — AB (ref 11.2–14.5)
WBC: 6 10*3/uL (ref 3.9–10.3)

## 2015-09-21 LAB — IRON AND TIBC
%SAT: 33 % (ref 21–57)
Iron: 130 ug/dL (ref 41–142)
TIBC: 400 ug/dL (ref 236–444)
UIBC: 270 ug/dL (ref 120–384)

## 2015-09-21 LAB — MAGNESIUM: MAGNESIUM: 2.1 mg/dL (ref 1.5–2.5)

## 2015-09-21 NOTE — Telephone Encounter (Addendum)
Kathryne Hitch, RN with Dr. Mariana Kaufman office to verify that it is OK for patient to have labs today instead of Thursday.  Juliann Pulse said that would be fine.  Appointment for lab has been made for 2:15 today.

## 2015-09-21 NOTE — Progress Notes (Signed)
Please see the Nurse Progress Note in the MD Initial Consult Encounter for this patient. 

## 2015-09-21 NOTE — Progress Notes (Signed)
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   HISTORY OF PRESENT ILLNESS::Lindsey Hughes is a 45 y.o. female who is seen out of the courtesy of Dr. Denman George for an opinion concerning radiation therapy as part of management of the patient's locally advanced squamous cell carcinoma the cervix.  Patient presented with a four-month history of persistent daily vaginal bleeding. She also has been experiencing some low back/upper pelvic pain and intermittent urinary incontinence..  As workup for her bleeding she saw Dr Pamala Hurry. An office endometrial biopsy was performed on 08/12/15 and revealed benign, weakly proliferative endoemetrium. During that office exam the posterior lip of the cervix was not visualized due to positioning of the uterus and body habitus. Passage of the pipelle was difficult due to obstruction at the endocervix. Pap was taken but was not resulted by the time of her scheduled hysterectomy. It later returned as ASC-H, positive for high risk HPV.  The patient was scheduled for a robotic hysterectomy and on 09/03/15 was taken to the OR. Intraoperative findings were significant for a very abnormal posterior lip of cervix which was nodular and friable. The cervix could be visualized intra-op under general anesthetic. 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 The procedure was aborted and a referral was made to [facility]. Exam by Dr. Denman George reveals a 6 cm palpable mass which has replaced the posterior lip of the cervix. The tumor on bimanual and rectovaginal examination is infiltrating the  parametrial tissues bilaterally. In light of the patient stage she is not felt to be a candidate for definitive surgery and is referred to radiation oncology and medical oncology for definitive treatment.  PREVIOUS RADIATION THERAPY: No  PAST MEDICAL HISTORY:  has a past medical history of Hypothyroidism; Anemia; Depression; and Headache.    PAST SURGICAL HISTORY: Past Surgical History  Procedure Laterality Date  . Cesarean section  1991   . Carpal tunnel release Bilateral 2002 and 2009  . Tubal ligation    . Lasik Left   . Dilation and curettage of uterus N/A 09/03/2015    Procedure: EXAM UNDER ANESTHESIA  WITH FROZEN SECTION CERVICAL BIOPSY & ENDOCERVICAL CURRETTINGS;  Surgeon: Aloha Gell, MD;  Location: [facility];  Service: Gynecology;  Laterality: N/A;    FAMILY HISTORY: family history includes Brain cancer in her father; CAD in her brother and mother; Lung cancer in her father and mother.   SOCIAL HISTORY:  reports that she has never smoked. She has never used smokeless tobacco. She reports that she does not drink alcohol or use illicit drugs. She does not work outside the home. The patient is busy taking care of her step daughter who is in renal failure and eventually will require transplant or dialysis.  ALLERGIES: Review of patient's allergies indicates no known allergies.  MEDICATIONS:  Current Outpatient Prescriptions  Medication Sig Dispense Refill  . ferrous fumarate (HEMOCYTE - 106 MG FE) 325 (106 Fe) MG TABS tablet Take 1 tablet by mouth.    Marland Kitchen ibuprofen (ADVIL,MOTRIN) 800 MG tablet Take 800 mg by mouth every 8 (eight) hours as needed for mild pain or moderate pain. Reported on 09/16/2015  4  . levothyroxine (SYNTHROID, LEVOTHROID) 50 MCG tablet Take 50 mcg by mouth daily.    Marland Kitchen trimethoprim (TRIMPEX) 100 MG tablet   10  . ALPRAZolam (XANAX) 0.25 MG tablet Take 0.25 mg by mouth as needed for anxiety. Reported on 09/21/2015     No current facility-administered medications for  this encounter.    REVIEW OF SYSTEMS:  A 15 point review of systems is documented in the electronic medical record. This was obtained by the nursing staff. However, I reviewed this with the patient to discuss relevant findings and make appropriate changes.  In addition to above-mentioned symptoms patient has noted some edema in both lower extremities particularly the right knee region.   PHYSICAL EXAM:  height is 5\' 2"  (1.575 m) and weight is 207 lb 6.4 oz (94.076 kg). Her oral temperature is 98.2 F (36.8 C). Her blood pressure is 126/84 and her pulse is 81. Her respiration is 16 and oxygen saturation is 100%.  General: Alert and oriented, in no acute distress HEENT: Head is normocephalic. Extraocular movements are intact. Oropharynx is clear. Teeth in good repair Neck: Neck is supple, no palpable cervical or supraclavicular lymphadenopathy. Heart: Regular in rate and rhythm with no murmurs, rubs, or gallops. Chest: Clear to auscultation bilaterally, with no rhonchi, wheezes, or rales. Abdomen: Soft, nontender, nondistended, with no rigidity or guarding. Extremities: No cyanosis or edema. Lymphatics: see Neck Exam Skin: No concerning lesions. Musculoskeletal: symmetric strength and muscle tone throughout. Neurologic: Cranial nerves II through XII are grossly intact. No obvious focalities. Speech is fluent. Coordination is intact. Psychiatric: Judgment and insight are intact. Affect is appropriate. Pelvic exam deferred until simulation and planning day at the patient's request in light of her ongoing bleeding issues.   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 EXAM: NUCLEAR MEDICINE PET SKULL BASE TO THIGH TECHNIQUE: 10.5 mCi F-18 FDG was injected intravenously. Full-ring PET imaging was performed from the skull base to thigh after the radiotracer. CT data was obtained and used for attenuation correction and anatomic localization. FASTING BLOOD GLUCOSE:  Value: 96 mg/dl COMPARISON:  No prior PET-CT.  04/25/2008 CT abdomen/pelvis. FINDINGS: NECK No hypermetabolic lymph nodes in the neck. Relatively symmetric hypermetabolism in the pharyngeal tonsils is probably physiologic. Asymmetric hypermetabolism in the left thyroid lobe without discrete thyroid nodule on the CT images with max SUV 10.2. CHEST Relatively symmetric brown fat hypermetabolism in the bilateral axilla and posterior far lower cervical triangles. Brown fat hypermetabolism in the left superior mediastinum between the left common carotid and left subclavian arteries. Brown fat hypermetabolism throughout the paraspinal intercostal spaces in the chest. No hypermetabolic axillary, mediastinal or hilar nodes. No pleural effusions. No acute consolidative airspace disease or significant pulmonary nodules. ABDOMEN/PELVIS No abnormal hypermetabolic activity within the liver, pancreas, adrenal glands, or spleen. Intensely  hypermetabolic 5.2 x 3.3 cm mass in the cervix with max SUV 42.3 (series 4/image 173). There is mild hypermetabolism throughout the endometrium with max SUV 6.2. There is asymmetric vague mild hypermetabolism in the high left adnexa (just superior to the normal appearing non hypermetabolic left ovary) without associated discrete left adnexal mass with max SUV 4.7. Mildly enlarged 1.1 cm right external iliac node (series 4/image 150) with low  level hypermetabolism (max SUV 3.6). Top-normal size 0.8 cm left external iliac node (series 4/image 162) with low level hypermetabolism (max SUV 3.3). No additional hypermetabolic nodes in the abdomen or pelvis. SKELETON No focal hypermetabolic activity to suggest skeletal metastasis. IMPRESSION: 1. Intensely hypermetabolic 5.2 x 3.3 cm cervical mass, consistent with primary cervix malignancy. 2. Mild hypermetabolism throughout the endometrium. Asymmetric mild high left adnexal hypermetabolism without discrete left adnexal mass. In a premenopausal patient, these are nonspecific findings that could be due to physiologic uptake, with local tumor spread into the uterus and left tubal tissues not excluded. 3. Low level hypermetabolism within mildly enlarged bilateral external iliac lymph nodes, suspicious for pelvic nodal metastases. 4. No hypermetabolic metastatic disease in the abdomen, chest or neck. 5. Asymmetric hypermetabolism in the left thyroid lobe without discrete thyroid nodule on the CT images. Differential includes occult hypermetabolic left thyroid lobe nodule versus asymmetric thyroiditis. Recommend correlation with serum thyroid function tests and thyroid ultrasound. Electronically Signed   By: Ilona Sorrel M.D.   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  PET scan late last week shows no distant metastasis but likely bilateral external iliac lymphadenopathy. Patient would be a good candidate for a definitive course of chemotherapy radiation with radiation therapy consisting of external beam and intracavitary treatments. I discussed the treatment course side effects and potential long-term toxicities of high dose radiation therapy directed at the pelvis with the patient. She appears to understand wishes to proceed with planned course of treatment.  PLAN: Simulation and planning on June 1 with treatments to begin likely the second  week in June concomitant with weekly cis-platinum based chemotherapy.  Patient will be seen by Dr. Marko Plume later this week.    ------------------------------------------------  Blair Promise, PhD, MD

## 2015-09-22 NOTE — Addendum Note (Signed)
Encounter addended by: Karen R Hess, RN on: 09/22/2015  8:45 AM<BR>     Documentation filed: Charges VN

## 2015-09-23 ENCOUNTER — Ambulatory Visit (HOSPITAL_BASED_OUTPATIENT_CLINIC_OR_DEPARTMENT_OTHER): Payer: BLUE CROSS/BLUE SHIELD

## 2015-09-23 ENCOUNTER — Ambulatory Visit
Admission: RE | Admit: 2015-09-23 | Discharge: 2015-09-23 | Disposition: A | Discharge status: Home / Self Care | Payer: BLUE CROSS/BLUE SHIELD | Source: Ambulatory Visit | Attending: Radiation Oncology | Admitting: Radiation Oncology

## 2015-09-23 ENCOUNTER — Ambulatory Visit (HOSPITAL_BASED_OUTPATIENT_CLINIC_OR_DEPARTMENT_OTHER): Payer: BLUE CROSS/BLUE SHIELD | Admitting: Oncology

## 2015-09-23 ENCOUNTER — Other Ambulatory Visit: Payer: BLUE CROSS/BLUE SHIELD

## 2015-09-23 VITALS — BP 117/70 | HR 73 | Temp 98.3°F | Resp 18 | Ht 62.0 in | Wt 208.3 lb

## 2015-09-23 VITALS — BP 120/69 | HR 68 | Temp 98.3°F | Ht 62.0 in | Wt 208.8 lb

## 2015-09-23 DIAGNOSIS — C539 Malignant neoplasm of cervix uteri, unspecified: Secondary | ICD-10-CM

## 2015-09-23 DIAGNOSIS — C531 Malignant neoplasm of exocervix: Secondary | ICD-10-CM

## 2015-09-23 DIAGNOSIS — R350 Frequency of micturition: Secondary | ICD-10-CM

## 2015-09-23 DIAGNOSIS — R351 Nocturia: Secondary | ICD-10-CM

## 2015-09-23 DIAGNOSIS — Z9851 Tubal ligation status: Secondary | ICD-10-CM

## 2015-09-23 DIAGNOSIS — E039 Hypothyroidism, unspecified: Secondary | ICD-10-CM

## 2015-09-23 DIAGNOSIS — N939 Abnormal uterine and vaginal bleeding, unspecified: Secondary | ICD-10-CM

## 2015-09-23 DIAGNOSIS — Z510 Encounter for antineoplastic radiation therapy: Secondary | ICD-10-CM | POA: Diagnosis not present

## 2015-09-23 LAB — URINALYSIS, MICROSCOPIC - CHCC
BILIRUBIN (URINE): NEGATIVE
Glucose: NEGATIVE mg/dL
KETONES: NEGATIVE mg/dL
Leukocyte Esterase: NEGATIVE
NITRITE: NEGATIVE
PH: 6 (ref 4.6–8.0)
Protein: NEGATIVE mg/dL
Specific Gravity, Urine: 1.01 (ref 1.003–1.035)
Urobilinogen, UR: 0.2 mg/dL (ref 0.2–1)

## 2015-09-23 MED ORDER — SODIUM CHLORIDE 0.9 % IJ SOLN
10.0000 mL | Freq: Once | INTRAMUSCULAR | Status: AC
  Administered 2015-09-23: 10 mL via INTRAVENOUS

## 2015-09-23 MED ORDER — OXYCODONE-ACETAMINOPHEN 5-325 MG PO TABS: 1.0000 | ORAL_TABLET | ORAL | Status: DC | PRN

## 2015-09-23 MED ORDER — ONDANSETRON HCL 8 MG PO TABS: 8.0000 mg | ORAL_TABLET | Freq: Three times a day (TID) | ORAL | Status: DC | PRN

## 2015-09-23 NOTE — Progress Notes (Signed)
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   Patient has history of abnormal PAPs ~ 5-7 years ago, not followed up. She presented to PCP with menorrhagia x 3-4 months, which progressed to continuous bleeding, Evaluation by Dr Pamala Hurry was complicated by cervical stenosis, and bleeding did not improve with hormonal intervention. [place] 07-15-15 showed normal right ovary, probably normal left ovary and uterine fibroid. Endometrial biopsy was accomplished on 08-12-15, pathology "weakly proliferative endometrium with glandular and stromal breakdown and abundant blood, no endometrial hyperplasia identified". She was taken for planned robotic hysterectomy by Dr Pamala Hurry on 09-03-15, however exam under anesthesia revealed abnormalities in posterior lip of cervix and hysterectomy not done. Biopsy [id number] invasive poorly differentiated squamous cell carcinoma with involvement of endocervix and focal vascular invasion. 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 She was seen in consultation by Dr Denman George on 09-16-15, with posterior lip of cervix replaced by 6 cm tumor, with tumor beginning to infiltrate into bilateral parametria. Recommendation is for radiation with sensitizing chemotherapy. PET 09-17-15 showed intensely hypermetabolic cervical mass 5.2 x 3.3 cm (SUV 42), mild hypermetabolic uptake thru endometrium (SUV 6.2) and in high left adnexa without CT  correlate,  bilateral external iliac nodes 0.8 - 1.1 cm with low level uptake.  Dr Sondra Come saw her in consultation on 09-21-15, simulation done 09-23-15 and radiation planned to begin 10-04-15, scheduled in EMR now thru 11-15-15. She attended chemotherapy education class prior to this visit.    REVIEW OF SYSTEMS  Mostly light vaginal spotting now, tho heavier after gyn exams. Bladder "pressure" x 2 months with some incontinence more recently, no dysuria or other clear symptoms of bladder infection. New nocturia ~ 6 x / night.  Low back pain in last several weeks which is now continuous, given Percocet prescription for this by Dr Sondra Come today. Usual bowel function x years is 4-6 stools daily immediately after eating, unchanged. Slight swelling mostly left foot and ankle recently, improves with elevation, no pain or cords in calves. She is at usual weight. Energy and appetite good. No nausea, GERD. No other bleeding. Occasional HA not new, uses prn tylenol or ibuprofen. Good visual acuity since lasik OS. No active dental concerns, has bridge, up to date on dental exams. Hypothyroid x 2-3 years,no symptoms now. No sinus symptoms. No difficulty hearing. No SOB or other respiratory problems. Night sweats x several years. Right knee discomfort x few weeks,no known trauma, not interfering with mobility. No other pain. No peripheral neuropathy. Remainder of full 10 point review of systems negative.   ALLERGIES: Review of patient's allergies indicates no known allergies.  PAST MEDICAL/ SURGICAL HISTORY:    G1P1  Daughter age 74 BTL Mammogram 2014 No colonoscopy No bone density Hypothyroid x 2-3 years, on medication by PCP. Has not had thyroid imaging.  PET 09-17-15 with asymmetric hypermetabolism left thyroid lobe without discreet nodule on CT, SUV 10.2 No transfusions Carpal tunnel surgery bilaterally  CURRENT MEDICATIONS: reviewed as listed now in EMR. Began oral iron  07-15-15 by Dr Pamala Hurry; discussed best  absorption on empty stomach with OJ Percocet prescription given by Dr Sondra Come, discussed pain medication slowing bowels Prescriptions for ondansetron and lorazepam for nausea  PHARMACY : [facility]:  From [place], lives with husband and 12 yo Psychiatrist; the stepdaughter was diagnosed with end stage renal disease ~ a month ago, hospitalized at [facility] but now back at home, has been placed on renal transplant waiting list.  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  No tobacco or ETOH  FAMILY HISTORY:  Mother and father both died of lung ca, both smokers.  Mother MI Father HTN and metastatic lung ca to brain. BIological daughter with HTN "since kindergarten" 8 siblings, one brother with MI in 61s, others healthy.  No other cancer in family         PHYSICAL EXAM:  height is 5\' 2"  (1.575 m) and weight is 208 lb 4.8 oz (94.484 kg). Her oral temperature is 98.3 F (36.8 C). Her blood pressure is 117/70 and her pulse is 73. Her respiration is 18 and oxygen saturation is 100%.  Alert, pleasant, cooperative lady, well developed/ well nourished, good historian, does not appear in discomfort. Easily and quickly mobile.  HEENT: normal hair pattern. PERRL. Oral mucosa moist and clear, good dentition, post pharynx clear. Neck supple without JVD or thyroid mass.  RESPIRATORY: lungs clear to A and P  CARDIAC/ VASCULAR: heart RRR no gallop, clear heart sounds. Peripheral pulses intact and symmetrical. Peripheral venous access appears adequate for chemo  ABDOMEN: soft, nontender, no appreciable mass or HSM. Normal bowel sounds  LYMPH NODES: no cervical, supraclavicular, axillary or inguinal adenopathy appreciated  BREASTS: bilaterally without dominant mass, skin or nipple findings  NEUROLOGIC: CN, motor, sensory cerebellar grossly nonfocal. Psych  Appropriate mood and affect  SKIN: without ecchymoses, rash,  petechiae  MUSCULOSKELETAL: good and symmetrical muscle mass. Spine not tender to palpation. Trace pedal edema without cords or tenderness    LABORATORY DATA:  Labs from 09-21-15: WBC 6.0, ANC 3.6, Hgb 12.3, MCV 82.5, plt 356 CMET normal including electrolytes, creatinine 0.8, LFTs, protein/ alb, Ca++ Mg 2.1 Iron 130, %sat 33  PATHOLOGY: FINAL for KATYA, REAVEY [id number]) Patient: ESTELLENE, SANMIGUEL Collected: 09/03/2015 Client: [facility] Accession: [id number] Received: 09/04/2015 Aloha Gell REPORT OF SURGICAL PATHOLOGY FINAL DIAGNOSIS Diagnosis 1. Cervix, biopsy - INVASIVE POORLY DIFFERENTIATED SQUAMOUS CELL CARCINOMA. 2. Endocervix, curettage - INVASIVE POORLY DIFFERENTIATED SQUAMOUS CELL CARICNOMA. - FOCAL VASCULAR INVASION BY TUMOR. Claudette Laws MD Pathologist, Electronic Signature (Case signed 09/06/2015) Intraoperative Diagnosis 1. CERVICAL BIOPSY, FROZEN SECTION DIAGNOSIS: SQUAMOUS CELL CARCINOMA  RADIOGRAPHY: EXAM: NUCLEAR MEDICINE PET SKULL BASE TO THIGH  TECHNIQUE: 10.5 mCi F-18 FDG was injected intravenously. Full-ring PET imaging was performed from the skull base to thigh after the radiotracer. CT data was obtained and used for attenuation correction and anatomic localization.  FASTING BLOOD GLUCOSE: Value: 96 mg/dl  COMPARISON: No prior PET-CT. 04/25/2008 CT abdomen/pelvis.  FINDINGS: NECK  No hypermetabolic lymph nodes in the neck. Relatively symmetric hypermetabolism in the pharyngeal tonsils is probably physiologic.  Asymmetric hypermetabolism in the left thyroid lobe without discrete thyroid nodule on the CT images with max SUV 10.2.  CHEST  Relatively symmetric brown fat hypermetabolism in the bilateral axilla and posterior far lower cervical triangles. Brown fat hypermetabolism in the left superior mediastinum between the left common carotid and left subclavian arteries. Brown fat hypermetabolism throughout the  paraspinal intercostal spaces in the  chest.  No hypermetabolic axillary, mediastinal or hilar nodes. No pleural effusions. No acute consolidative airspace disease or significant pulmonary nodules.  ABDOMEN/PELVIS  No abnormal hypermetabolic activity within the liver, pancreas, adrenal glands, or spleen.  Intensely hypermetabolic 5.2 x 3.3 cm mass in the cervix with max SUV 42.3 (series 4/image 173). There is mild hypermetabolism throughout the endometrium with max SUV 6.2. There is asymmetric vague mild hypermetabolism in the high left adnexa (just superior to the normal appearing non hypermetabolic left ovary) without associated discrete left adnexal mass with max SUV 4.7.  Mildly enlarged 1.1 cm right external iliac node (series 4/image 150) with low level hypermetabolism (max SUV 3.6). Top-normal size 0.8 cm left external iliac node (series 4/image 162) with low level hypermetabolism (max SUV 3.3). No additional hypermetabolic nodes in the abdomen or pelvis.  SKELETON  No focal hypermetabolic activity to suggest skeletal metastasis.  IMPRESSION: 1. Intensely hypermetabolic 5.2 x 3.3 cm cervical mass, consistent with primary cervix malignancy. 2. Mild hypermetabolism throughout the endometrium. Asymmetric mild high left adnexal hypermetabolism without discrete left adnexal mass. In a premenopausal patient, these are nonspecific findings that could be due to physiologic uptake, with local tumor spread into the uterus and left tubal tissues not excluded. 3. Low level hypermetabolism within mildly enlarged bilateral external iliac lymph nodes, suspicious for pelvic nodal metastases. 4. No hypermetabolic metastatic disease in the abdomen, chest or neck. 5. Asymmetric hypermetabolism in the left thyroid lobe without discrete thyroid nodule on the CT images. Differential includes occult hypermetabolic left thyroid lobe nodule versus asymmetric thyroiditis. Recommend  correlation with serum thyroid function tests and thyroid ultrasound.        DISCUSSION: All of history above reviewed with patient.  Rationale for chemoradiation for cervical cancer reviewed, as well as mechanism of action of chemo, weekly outpatient administration coordinating with radiation, need for oral and IV hydration, antiemetics, possible side effects including drop in blood counts, nausea, some chance of hair loss. She has had all questions answered and is in agreement with chemotherapy as recommended, verbal consent obtained. We will begin 10-04-15 with start of radiation.  Discussed adjusting work activities if fatigue or otherwise any difficulty tolerating the physical exertion.     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 Plan weekly sensitizing CDDP with radiation, to begin 10-04-15. I will see her back coordinating with other appointments as possible. Back pain, bladder symptoms likely associated. UA C&S sent now to be sure not UTI. 2.hypothyroid on replacement: PET shows some uptake in left lobe thyroid without discreeet nodule on CT imaging. Will need thyroid US at least after completion of this treatment if not already done by PCP (per patient's history, probably has not had thyroid US) 3.post bilateral carpal tunnel surgery and BTL 4.social stress with 63 yo step daughter just diagnosed with renal failure, apparently congenital renal problems not previously known. On wait list for renal transplant. 5.multiple bowel movements daily as baseline. Discussed possible increase/ diarrhea related to acute radiation effects. Follow 6.continued vaginal bleeding: even with Hgb and iron studies ok, have recommended continuing daily oral iron for now   Patient had questions answered to her satisfaction and is in agreement with plan above. She can contact this office for  questions or concerns at any time prior to next scheduled visit. Chemo orders placed, message to managed care for preauthorization. Scheduling coordinated for follow up MD visits.  Route PCP, cc Drs Denman George, Feliz Beam Time spent 50 min, including >50% discussion and coordination of care.    Gordy Levan, MD 09/25/2015 6:40 PM

## 2015-09-23 NOTE — Progress Notes (Signed)
  Radiation Oncology         ([phone]) [phone] ________________________________  Name: Lindsey Hughes MRN: [id number]  Date: 09/23/2015  DOB: [birth year]  SIMULATION AND TREATMENT PLANNING NOTE    ICD-9-CM ICD-10-CM   1. Malignant neoplasm of exocervix (HCC) 180.1 C53.1     DIAGNOSIS: Stage IIB poorly differentiated squamous cell carcinoma of the cervix  NARRATIVE:  The patient was brought to the [facility].  Identity was confirmed.  All relevant records and images related to the planned course of therapy were reviewed.  The patient freely provided informed written consent to proceed with treatment after reviewing the details related to the planned course of therapy. The consent form was witnessed and verified by the simulation staff.  Then, the patient was set-up in a stable reproducible  supine position for radiation therapy.  CT images were obtained.  Surface markings were placed.  The CT images were loaded into the planning software.  Then the target and avoidance structures were contoured.  Treatment planning then occurred.  The radiation prescription was entered and confirmed.  Then, I designed and supervised the construction of a total of 5 medically necessary complex treatment devices.  I have requested : 3D Simulation  I have requested a DVH of the following structures: Uterus, cervix, bladder, rectum, small bowel, large bowel nodal PTV.  I have ordered:dose calc.  PLAN:  The patient will receive 45 Gy in 25 fractions along with radiosensitizing chemotherapy. After Completion of 45 gray the patient will then proceed with the bilateral pelvic sidewall boost encompassing the PET positive nodes within the external iliac chain. Patient will receive 9 gray in 5 fractions with her boost field. The patient will then proceed with 5 high-dose rate treatments using iridium 192 as the high-dose-rate source directed at the cervical region.   Special Treatment Procedure Note: The patient will  be receiving radiosensitizing chemotherapy. Given the potential of increased toxicities related to combined therapy and the necessity for close monitoring of the patient and blood work, this constitutes a special treatment procedure.  -----------------------------------  Blair Promise, PhD, MD  This document serves as a record of services personally performed by Gery Pray, MD. It was created on his behalf by Darcus Austin, a trained medical scribe. The creation of this record is based on the scribe's personal observations and the provider's statements to them. This document has been checked and approved by the attending provider.

## 2015-09-23 NOTE — Progress Notes (Signed)
D/c  IV  Left hand, catheter tip intact, placed 2x2 gause x 2  Over site and taped down, instructions to leave dressing on for a few hours, and when taking off  If bleeding recurs to re apply dressing and leave on over night, patient aiting for pain medication rx, informed Dr. Sondra Come 2:59 PM

## 2015-09-23 NOTE — Addendum Note (Signed)
Encounter addended by: Jacqulyn Liner, RN on: 09/23/2015  3:40 PM<BR>     Documentation filed: Inpatient Document Flowsheet, Lines/Drains/Airways Properties Editor

## 2015-09-23 NOTE — Progress Notes (Signed)
Does patient have an allergy to IV contrast dye?: No.   Has patient ever received premedication for IV contrast dye?: No.   Does patient take metformin?: No.  If patient does take metformin when was the last dose: n/a  Date of lab work: Sep 21, 2015 BUN: 12.7  CR: 0.8  IV site: hand left, condition patent and no redness  BP 120/69 mmHg  Pulse 68  Temp(Src) 98.3 F (36.8 C) (Oral)  Ht 5\' 2"  (1.575 m)  Wt 208 lb 12.8 oz (94.711 kg)  BMI 38.18 kg/m2  SpO2 100%

## 2015-09-24 ENCOUNTER — Telehealth: Payer: Self-pay | Admitting: Oncology

## 2015-09-24 MED ORDER — LORAZEPAM 1 MG PO TABS: ORAL_TABLET | ORAL | Status: DC

## 2015-09-24 NOTE — Telephone Encounter (Signed)
husband said pt will call back to confirm new apts

## 2015-09-25 ENCOUNTER — Encounter: Payer: Self-pay | Admitting: Oncology

## 2015-09-25 DIAGNOSIS — R350 Frequency of micturition: Secondary | ICD-10-CM | POA: Insufficient documentation

## 2015-09-25 DIAGNOSIS — Z9851 Tubal ligation status: Secondary | ICD-10-CM | POA: Insufficient documentation

## 2015-09-25 DIAGNOSIS — E039 Hypothyroidism, unspecified: Secondary | ICD-10-CM | POA: Insufficient documentation

## 2015-09-25 DIAGNOSIS — N939 Abnormal uterine and vaginal bleeding, unspecified: Secondary | ICD-10-CM | POA: Insufficient documentation

## 2015-09-25 LAB — URINE CULTURE

## 2015-09-26 ENCOUNTER — Other Ambulatory Visit: Payer: Self-pay | Admitting: Oncology

## 2015-09-28 ENCOUNTER — Telehealth: Payer: Self-pay

## 2015-09-28 NOTE — Telephone Encounter (Signed)
Told Lindsey Hughes that there no significant infection in her urine on culture done 09-23-15 per Dr. Marko Plume.

## 2015-09-29 DIAGNOSIS — Z510 Encounter for antineoplastic radiation therapy: Secondary | ICD-10-CM | POA: Diagnosis not present

## 2015-09-30 ENCOUNTER — Ambulatory Visit
Admission: RE | Admit: 2015-09-30 | Discharge: 2015-09-30 | Disposition: A | Discharge status: Home / Self Care | Payer: BLUE CROSS/BLUE SHIELD | Source: Ambulatory Visit | Attending: Radiation Oncology | Admitting: Radiation Oncology

## 2015-09-30 DIAGNOSIS — Z510 Encounter for antineoplastic radiation therapy: Secondary | ICD-10-CM | POA: Diagnosis not present

## 2015-09-30 DIAGNOSIS — C531 Malignant neoplasm of exocervix: Secondary | ICD-10-CM

## 2015-09-30 NOTE — Progress Notes (Signed)
  Radiation Oncology         [phone]) [phone] ________________________________  Name: Lindsey Hughes MRN: [id number]  Date: 09/30/2015  DOB: Feb 18, 1971  Simulation Verification Note    ICD-9-CM ICD-10-CM   1. Malignant neoplasm of exocervix ([facility]) 180.1 C53.1     Status: outpatient  NARRATIVE: The patient was brought to the treatment unit and placed in the planned treatment position. The clinical setup was verified. Then port films were obtained and uploaded to the radiation oncology medical record software.  The treatment beams were carefully compared against the planned radiation fields. The position location and shape of the radiation fields was reviewed. They targeted volume of tissue appears to be appropriately covered by the radiation beams. Organs at risk appear to be excluded as planned.  Based on my personal review, I approved the simulation verification. The patient's treatment will proceed as planned.  -----------------------------------  Blair Promise, PhD, MD

## 2015-10-01 ENCOUNTER — Telehealth: Payer: Self-pay

## 2015-10-01 NOTE — Telephone Encounter (Signed)
Reviewed Pre hydration instruction handout with Ms Levey for treatment 10-04-15.  She found instruction sheet in Chemo Education binder given at the class.

## 2015-10-01 NOTE — Telephone Encounter (Signed)
-----   Message from Gordy Levan, MD sent at 09/23/2015  5:31 PM EDT ----- [facility], in time for first chemo 6-12  Ondansetron 8 mg    1 q 8 hr prn nausea. WIll not make drowsy   #30   1 RF Lorazepam 1 mg   1/2 -1 Sl or po q 6 hr prn nausea. WIll make drowsy  #20  RN please call her to go over oral prehydration shortly prior to first chemo 6-12  - She is not sure if she has instruction sheet for this (probably does in chemo ed materials) but will be in rad onc on 6-8 if need to give it to her then.  thanks

## 2015-10-03 ENCOUNTER — Other Ambulatory Visit: Payer: Self-pay | Admitting: Oncology

## 2015-10-04 ENCOUNTER — Other Ambulatory Visit (HOSPITAL_BASED_OUTPATIENT_CLINIC_OR_DEPARTMENT_OTHER): Payer: BLUE CROSS/BLUE SHIELD

## 2015-10-04 ENCOUNTER — Ambulatory Visit
Admission: RE | Admit: 2015-10-04 | Discharge: 2015-10-04 | Disposition: A | Discharge status: Home / Self Care | Payer: BLUE CROSS/BLUE SHIELD | Source: Ambulatory Visit | Attending: Radiation Oncology | Admitting: Radiation Oncology

## 2015-10-04 ENCOUNTER — Ambulatory Visit (HOSPITAL_BASED_OUTPATIENT_CLINIC_OR_DEPARTMENT_OTHER): Payer: BLUE CROSS/BLUE SHIELD

## 2015-10-04 ENCOUNTER — Other Ambulatory Visit: Payer: Self-pay | Admitting: Oncology

## 2015-10-04 VITALS — BP 114/75 | HR 75 | Temp 98.5°F | Resp 18

## 2015-10-04 DIAGNOSIS — Z510 Encounter for antineoplastic radiation therapy: Secondary | ICD-10-CM | POA: Diagnosis not present

## 2015-10-04 DIAGNOSIS — C531 Malignant neoplasm of exocervix: Secondary | ICD-10-CM

## 2015-10-04 DIAGNOSIS — Z5111 Encounter for antineoplastic chemotherapy: Secondary | ICD-10-CM

## 2015-10-04 LAB — COMPREHENSIVE METABOLIC PANEL
ALBUMIN: 3.9 g/dL (ref 3.5–5.0)
ALK PHOS: 65 U/L (ref 40–150)
ALT: 20 U/L (ref 0–55)
AST: 20 U/L (ref 5–34)
Anion Gap: 10 mEq/L (ref 3–11)
BILIRUBIN TOTAL: 0.44 mg/dL (ref 0.20–1.20)
BUN: 11.5 mg/dL (ref 7.0–26.0)
CALCIUM: 9.4 mg/dL (ref 8.4–10.4)
CO2: 24 mEq/L (ref 22–29)
Chloride: 104 mEq/L (ref 98–109)
Creatinine: 0.9 mg/dL (ref 0.6–1.1)
EGFR: 82 mL/min/{1.73_m2} — ABNORMAL LOW (ref >90)
Glucose: 93 mg/dl (ref 70–140)
POTASSIUM: 3.7 meq/L (ref 3.5–5.1)
SODIUM: 138 meq/L (ref 136–145)
Total Protein: 7.7 g/dL (ref 6.4–8.3)

## 2015-10-04 LAB — CBC WITH DIFFERENTIAL/PLATELET
BASO%: 1.4 % (ref 0.0–2.0)
BASOS ABS: 0.1 10*3/uL (ref 0.0–0.1)
EOS%: 4.7 % (ref 0.0–7.0)
Eosinophils Absolute: 0.4 10*3/uL (ref 0.0–0.5)
HEMATOCRIT: 40.9 % (ref 34.8–46.6)
HGB: 13.3 g/dL (ref 11.6–15.9)
LYMPH%: 20.8 % (ref 14.0–49.7)
MCH: 27.3 pg (ref 25.1–34.0)
MCHC: 32.5 g/dL (ref 31.5–36.0)
MCV: 84 fL (ref 79.5–101.0)
MONO#: 0.6 10*3/uL (ref 0.1–0.9)
MONO%: 8.1 % (ref 0.0–14.0)
NEUT#: 5 10*3/uL (ref 1.5–6.5)
NEUT%: 65 % (ref 38.4–76.8)
Platelets: 341 10*3/uL (ref 145–400)
RBC: 4.87 10*6/uL (ref 3.70–5.45)
RDW: 15.1 % — ABNORMAL HIGH (ref 11.2–14.5)
WBC: 7.6 10*3/uL (ref 3.9–10.3)
lymph#: 1.6 10*3/uL (ref 0.9–3.3)

## 2015-10-04 LAB — MAGNESIUM: MAGNESIUM: 2.2 mg/dL (ref 1.5–2.5)

## 2015-10-04 MED ORDER — POTASSIUM CHLORIDE 2 MEQ/ML IV SOLN
Freq: Once | INTRAVENOUS | Status: AC
  Administered 2015-10-04: 11:00:00 via INTRAVENOUS
  Filled 2015-10-04: qty 10

## 2015-10-04 MED ORDER — SODIUM CHLORIDE 0.9 % IV SOLN
40.0000 mg/m2 | Freq: Once | INTRAVENOUS | Status: AC
  Administered 2015-10-04: 81 mg via INTRAVENOUS
  Filled 2015-10-04: qty 81

## 2015-10-04 MED ORDER — LORAZEPAM 2 MG/ML IJ SOLN: 1.0000 mg | Freq: Once | INTRAMUSCULAR | Status: DC | PRN

## 2015-10-04 MED ORDER — SODIUM CHLORIDE 0.9 % IV SOLN
Freq: Once | INTRAVENOUS | Status: AC
  Administered 2015-10-04: 13:00:00 via INTRAVENOUS
  Filled 2015-10-04: qty 5

## 2015-10-04 MED ORDER — SODIUM CHLORIDE 0.9 % IV SOLN
Freq: Once | INTRAVENOUS | Status: AC
  Administered 2015-10-04: 12:00:00 via INTRAVENOUS
  Filled 2015-10-04: qty 4

## 2015-10-04 MED ORDER — SODIUM CHLORIDE 0.9 % IV SOLN
Freq: Once | INTRAVENOUS | Status: AC
  Administered 2015-10-04: 10:00:00 via INTRAVENOUS

## 2015-10-04 NOTE — Patient Instructions (Signed)
[facility] Discharge Instructions for Patients Receiving Chemotherapy  Today you received the following chemotherapy agents: Cisplatin.  To help prevent nausea and vomiting after your treatment, we encourage you to take your nausea medication; Zofran 8 mg every 8 hours as needed.   If you develop nausea and vomiting that is not controlled by your nausea medication, call the clinic.   BELOW ARE SYMPTOMS THAT SHOULD BE REPORTED IMMEDIATELY:  *FEVER GREATER THAN 100.5 F  *CHILLS WITH OR WITHOUT FEVER  NAUSEA AND VOMITING THAT IS NOT CONTROLLED WITH YOUR NAUSEA MEDICATION  *UNUSUAL SHORTNESS OF BREATH  *UNUSUAL BRUISING OR BLEEDING  TENDERNESS IN MOUTH AND THROAT WITH OR WITHOUT PRESENCE OF ULCERS  *URINARY PROBLEMS  *BOWEL PROBLEMS  UNUSUAL RASH Items with * indicate a potential emergency and should be followed up as soon as possible.  Feel free to call the clinic you have any questions or concerns. The clinic phone number is [phone].  Please show the [facility] at check-in to the Emergency Department and triage nurse.    Cisplatin injection What is this medicine? CISPLATIN (SIS pla tin) is a chemotherapy drug. It targets fast dividing cells, like cancer cells, and causes these cells to die. This medicine is used to treat many types of cancer like bladder, ovarian, and testicular cancers. This medicine may be used for other purposes; ask your health care provider or pharmacist if you have questions. What should I tell my health care provider before I take this medicine? They need to know if you have any of these conditions: -blood disorders -hearing problems -kidney disease -recent or ongoing radiation therapy -an unusual or allergic reaction to cisplatin, carboplatin, other chemotherapy, other medicines, foods, dyes, or preservatives -pregnant or trying to get pregnant -breast-feeding How should I use this medicine? This drug is given as an  infusion into a vein. It is administered in a hospital or clinic by a specially trained health care professional. Talk to your pediatrician regarding the use of this medicine in children. Special care may be needed. Overdosage: If you think you have taken too much of this medicine contact a poison control center or emergency room at once. NOTE: This medicine is only for you. Do not share this medicine with others. What if I miss a dose? It is important not to miss a dose. Call your doctor or health care professional if you are unable to keep an appointment. What may interact with this medicine? -dofetilide -foscarnet -medicines for seizures -medicines to increase blood counts like filgrastim, pegfilgrastim, sargramostim -probenecid -pyridoxine used with altretamine -rituximab -some antibiotics like amikacin, gentamicin, neomycin, polymyxin B, streptomycin, tobramycin -sulfinpyrazone -vaccines -zalcitabine Talk to your doctor or health care professional before taking any of these medicines: -acetaminophen -aspirin -ibuprofen -ketoprofen -naproxen This list may not describe all possible interactions. Give your health care provider a list of all the medicines, herbs, non-prescription drugs, or dietary supplements you use. Also tell them if you smoke, drink alcohol, or use illegal drugs. Some items may interact with your medicine. What should I watch for while using this medicine? Your condition will be monitored carefully while you are receiving this medicine. You will need important blood work done while you are taking this medicine. This drug may make you feel generally unwell. This is not uncommon, as chemotherapy can affect healthy cells as well as cancer cells. Report any side effects. Continue your course of treatment even though you feel ill unless your doctor tells you to  stop. In some cases, you may be given additional medicines to help with side effects. Follow all directions for  their use. Call your doctor or health care professional for advice if you get a fever, chills or sore throat, or other symptoms of a cold or flu. Do not treat yourself. This drug decreases your body's ability to fight infections. Try to avoid being around people who are sick. This medicine may increase your risk to bruise or bleed. Call your doctor or health care professional if you notice any unusual bleeding. Be careful brushing and flossing your teeth or using a toothpick because you may get an infection or bleed more easily. If you have any dental work done, tell your dentist you are receiving this medicine. Avoid taking products that contain aspirin, acetaminophen, ibuprofen, naproxen, or ketoprofen unless instructed by your doctor. These medicines may hide a fever. Do not become pregnant while taking this medicine. Women should inform their doctor if they wish to become pregnant or think they might be pregnant. There is a potential for serious side effects to an unborn child. Talk to your health care professional or pharmacist for more information. Do not breast-feed an infant while taking this medicine. Drink fluids as directed while you are taking this medicine. This will help protect your kidneys. Call your doctor or health care professional if you get diarrhea. Do not treat yourself. What side effects may I notice from receiving this medicine? Side effects that you should report to your doctor or health care professional as soon as possible: -allergic reactions like skin rash, itching or hives, swelling of the face, lips, or tongue -signs of infection - fever or chills, cough, sore throat, pain or difficulty passing urine -signs of decreased platelets or bleeding - bruising, pinpoint red spots on the skin, black, tarry stools, nosebleeds -signs of decreased red blood cells - unusually weak or tired, fainting spells, lightheadedness -breathing problems -changes in hearing -gout pain -low  blood counts - This drug may decrease the number of white blood cells, red blood cells and platelets. You may be at increased risk for infections and bleeding. -nausea and vomiting -pain, swelling, redness or irritation at the injection site -pain, tingling, numbness in the hands or feet -problems with balance, movement -trouble passing urine or change in the amount of urine Side effects that usually do not require medical attention (report to your doctor or health care professional if they continue or are bothersome): -changes in vision -loss of appetite -metallic taste in the mouth or changes in taste This list may not describe all possible side effects. Call your doctor for medical advice about side effects. You may report side effects to FDA at 1-800-FDA-1088. Where should I keep my medicine? This drug is given in a hospital or clinic and will not be stored at home. NOTE: This sheet is a summary. It may not cover all possible information. If you have questions about this medicine, talk to your doctor, pharmacist, or health care provider.    2016, [employer]. (2007-07-16 14:40:54)

## 2015-10-04 NOTE — Progress Notes (Signed)
Poor venous access. Reviewed portacath insertion with patient and showed her and her husband an example of what one looks and feels like. Pt wanted to think about it for awhile. By the end of her chemotherapy she stated she was agreeable to having port placement.  Dr. Mariana Kaufman nurse, louise, RN notified. She will discuss with Dr. Marko Plume.  Educated pt on side effects of chemo and use of anti-emetics, hydration and adequate nutrition and rest. Pt and husband voiced understanding.

## 2015-10-05 ENCOUNTER — Ambulatory Visit
Admission: RE | Admit: 2015-10-05 | Discharge: 2015-10-05 | Disposition: A | Discharge status: Home / Self Care | Payer: BLUE CROSS/BLUE SHIELD | Source: Ambulatory Visit | Attending: Radiation Oncology | Admitting: Radiation Oncology

## 2015-10-05 ENCOUNTER — Telehealth: Payer: Self-pay

## 2015-10-05 VITALS — BP 121/80 | HR 75 | Temp 97.6°F | Ht 62.0 in | Wt 211.1 lb

## 2015-10-05 DIAGNOSIS — C531 Malignant neoplasm of exocervix: Secondary | ICD-10-CM

## 2015-10-05 DIAGNOSIS — Z510 Encounter for antineoplastic radiation therapy: Secondary | ICD-10-CM | POA: Diagnosis not present

## 2015-10-05 MED ORDER — OXYCODONE-ACETAMINOPHEN 5-325 MG PO TABS: 1.0000 | ORAL_TABLET | ORAL | Status: DC | PRN

## 2015-10-05 NOTE — Telephone Encounter (Signed)
Progress Notes   Levora Murrah (MR# [id number])      Progress Notes Info    Author Note Status Last Update User Last Update Date/Time   Otila Kluver, RN Signed Otila Kluver, RN 10/04/2015 5:22 PM    Progress Notes    Expand All Collapse All   Poor venous access. Reviewed portacath insertion with patient and showed her and her husband an example of what one looks and feels like. Pt wanted to think about it for awhile. By the end of her chemotherapy she stated she was agreeable to having port placement. Dr. Mariana Kaufman nurse, Joylyn Duggin, RN notified. She will discuss with Dr. Marko Plume.  Educated pt on side effects of chemo and use of anti-emetics, hydration and adequate nutrition and rest. Pt and husband voiced understanding.

## 2015-10-05 NOTE — Progress Notes (Signed)
Lindsey Hughes has completed 2 fractions to her pelvis.  She reports pain today due to a headache.  She also has pain in her lower back and has been taking oxycodone/acetaminophen q 4 hours and has 3 tablets left.  She continues to report urinating frequently.  She denies having diarrhea.  She reports having a small amount of vaginal bleeding and also hematuria yesterday.  She had her first cycle of chemotherapy yesterday.  BP 121/80 mmHg  Pulse 75  Temp(Src) 97.6 F (36.4 C) (Oral)  Ht 5\' 2"  (1.575 m)  Wt 211 lb 1.6 oz (95.754 kg)  BMI 38.60 kg/m2  SpO2 100%   Wt Readings from Last 3 Encounters:  10/05/15 211 lb 1.6 oz (95.754 kg)  09/23/15 208 lb 4.8 oz (94.484 kg)  09/23/15 208 lb 12.8 oz (94.711 kg)

## 2015-10-05 NOTE — Progress Notes (Signed)
  Radiation Oncology         [phone] ________________________________  Name: Lindsey Hughes MRN: [id number]  Date: 10/05/2015  DOB: 1970/04/27  Weekly Radiation Therapy Management    ICD-9-CM ICD-10-CM   1. 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 . . . . . . . The patient presents for routine under treatment assessment.                                   Lindsey Hughes has completed 2 fractions to her pelvis. She reports pain today due to a headache. She also has pain in her lower back and has been taking oxycodone/acetaminophen q 4 hours and has 3 tablets left. She continues to report urinating frequently. She denies having diarrhea. She reports having a small amount of vaginal bleeding and also hematuria yesterday. She had her first cycle of chemotherapy yesterday.                                 Set-up films were reviewed.                                 The chart was checked. Physical Findings. . .  height is 5\' 2"  (1.575 m) and weight is 211 lb 1.6 oz (95.754 kg). Her oral temperature is 97.6 F (36.4 C). Her blood pressure is 121/80 and her pulse is 75. Her oxygen saturation is 100%. .The lungs are clear. The heart has a regular rhythm and rate. The abdomen is soft and nontender with normal bowel sounds.  Impression . . . . . . . The patient is tolerating radiation. Plan . . . . . . . . . . . . Continue treatment as planned.  Patient had a refill on her oxycodone/acetaminophen today.  She is taking approximately 2 tablets per day.  ________________________________   Blair Promise, PhD, MD

## 2015-10-05 NOTE — Progress Notes (Signed)
Pt here for patient teaching.  Pt given Radiation and You booklet. Pt reports they have not watched the Radiation Therapy Education video and has been given the link to watch at home.  Reviewed areas of pertinence such as diarrhea, fatigue, hair loss, nausea and vomiting, skin changes and urinary and bladder changes . Pt able to give teach back of to pat skin, use unscented/gentle soap, use baby wipes, have Imodium on hand, drink plenty of water and sitz bath,avoid applying anything to skin within 4 hours of treatment. Pt demonstrated understanding and verbalizes understanding of information given and will contact nursing with any questions or concerns.          

## 2015-10-05 NOTE — Telephone Encounter (Signed)
Whatever can work out is ok. thanks

## 2015-10-05 NOTE — Telephone Encounter (Signed)
Lindsey Hughes with [facility] called and stated that the first available PAC  placement is on 10-11-15 at 0930.  Patient's treatment is at 1000 x 6 hrs.   Can the PAC be placed on 10-11-15 and chemotherapy moved to Tuesday?

## 2015-10-06 ENCOUNTER — Telehealth: Payer: Self-pay

## 2015-10-06 ENCOUNTER — Ambulatory Visit
Admission: RE | Admit: 2015-10-06 | Discharge: 2015-10-06 | Disposition: A | Discharge status: Home / Self Care | Payer: BLUE CROSS/BLUE SHIELD | Source: Ambulatory Visit | Attending: Radiation Oncology | Admitting: Radiation Oncology

## 2015-10-06 DIAGNOSIS — Z510 Encounter for antineoplastic radiation therapy: Secondary | ICD-10-CM | POA: Diagnosis not present

## 2015-10-06 NOTE — Telephone Encounter (Signed)
S/w husband Lindsey Hughes that [facility] placement is scheduled for Monday and chemo will be moved to Tues or Wed. Lindsey Hughes stated he would have pt call us back. Will need to give information re: npo after [place], show up at 0730 for 0930 placement, need to bring a driver.  S/w Amanie with direction about PAC placement. She then mentioned having a throbbing headache. She stopped coffee/caffiene cold [place], she has used zofran. She is drinking adequate fluids. Discussed using lorazepam instead of zofran and drinking 1/2 cup of coffee and easing off the caffeine. Also discussed using advil b/c tylenol not helping much. Pt denies any constipation or increased body aches.

## 2015-10-07 ENCOUNTER — Ambulatory Visit
Admission: RE | Admit: 2015-10-07 | Discharge: 2015-10-07 | Disposition: A | Discharge status: Home / Self Care | Payer: BLUE CROSS/BLUE SHIELD | Source: Ambulatory Visit | Attending: Radiation Oncology | Admitting: Radiation Oncology

## 2015-10-07 ENCOUNTER — Ambulatory Visit (HOSPITAL_BASED_OUTPATIENT_CLINIC_OR_DEPARTMENT_OTHER): Payer: BLUE CROSS/BLUE SHIELD | Admitting: Oncology

## 2015-10-07 ENCOUNTER — Telehealth: Payer: Self-pay | Admitting: *Deleted

## 2015-10-07 ENCOUNTER — Encounter: Payer: Self-pay | Admitting: General Practice

## 2015-10-07 ENCOUNTER — Other Ambulatory Visit: Payer: Self-pay | Admitting: Oncology

## 2015-10-07 ENCOUNTER — Telehealth: Payer: Self-pay | Admitting: Oncology

## 2015-10-07 ENCOUNTER — Ambulatory Visit: Payer: BLUE CROSS/BLUE SHIELD | Attending: Radiation Oncology | Admitting: Radiation Oncology

## 2015-10-07 ENCOUNTER — Ambulatory Visit (HOSPITAL_BASED_OUTPATIENT_CLINIC_OR_DEPARTMENT_OTHER): Payer: BLUE CROSS/BLUE SHIELD | Admitting: *Deleted

## 2015-10-07 ENCOUNTER — Other Ambulatory Visit: Payer: Self-pay

## 2015-10-07 ENCOUNTER — Encounter: Payer: Self-pay | Admitting: Oncology

## 2015-10-07 ENCOUNTER — Other Ambulatory Visit (HOSPITAL_COMMUNITY)
Admission: AD | Admit: 2015-10-07 | Discharge: 2015-10-07 | Disposition: A | Discharge status: Home / Self Care | Payer: BLUE CROSS/BLUE SHIELD | Source: Ambulatory Visit | Attending: Oncology | Admitting: Oncology

## 2015-10-07 ENCOUNTER — Other Ambulatory Visit: Payer: Self-pay | Admitting: Radiation Oncology

## 2015-10-07 ENCOUNTER — Ambulatory Visit (HOSPITAL_BASED_OUTPATIENT_CLINIC_OR_DEPARTMENT_OTHER): Payer: BLUE CROSS/BLUE SHIELD

## 2015-10-07 VITALS — BP 100/60 | HR 80 | Temp 97.8°F | Resp 18 | Ht 62.0 in | Wt 210.3 lb

## 2015-10-07 VITALS — BP 118/60 | HR 70 | Temp 98.0°F | Resp 18

## 2015-10-07 DIAGNOSIS — N939 Abnormal uterine and vaginal bleeding, unspecified: Secondary | ICD-10-CM

## 2015-10-07 DIAGNOSIS — E039 Hypothyroidism, unspecified: Secondary | ICD-10-CM | POA: Diagnosis not present

## 2015-10-07 DIAGNOSIS — C531 Malignant neoplasm of exocervix: Secondary | ICD-10-CM

## 2015-10-07 DIAGNOSIS — R197 Diarrhea, unspecified: Secondary | ICD-10-CM | POA: Diagnosis not present

## 2015-10-07 DIAGNOSIS — Z659 Problem related to unspecified psychosocial circumstances: Secondary | ICD-10-CM

## 2015-10-07 DIAGNOSIS — C539 Malignant neoplasm of cervix uteri, unspecified: Secondary | ICD-10-CM

## 2015-10-07 DIAGNOSIS — Z510 Encounter for antineoplastic radiation therapy: Secondary | ICD-10-CM | POA: Diagnosis not present

## 2015-10-07 DIAGNOSIS — I878 Other specified disorders of veins: Secondary | ICD-10-CM | POA: Diagnosis not present

## 2015-10-07 LAB — CBC WITH DIFFERENTIAL/PLATELET
BASO%: 0.9 % (ref 0.0–2.0)
BASOS ABS: 0.1 10*3/uL (ref 0.0–0.1)
EOS ABS: 0 10*3/uL (ref 0.0–0.5)
EOS%: 0.6 % (ref 0.0–7.0)
HCT: 41.4 % (ref 34.8–46.6)
HGB: 13.5 g/dL (ref 11.6–15.9)
LYMPH%: 24.3 % (ref 14.0–49.7)
MCH: 27.7 pg (ref 25.1–34.0)
MCHC: 32.7 g/dL (ref 31.5–36.0)
MCV: 84.7 fL (ref 79.5–101.0)
MONO#: 0.5 10*3/uL (ref 0.1–0.9)
MONO%: 6.4 % (ref 0.0–14.0)
NEUT#: 4.8 10*3/uL (ref 1.5–6.5)
NEUT%: 67.8 % (ref 38.4–76.8)
Platelets: 317 10*3/uL (ref 145–400)
RBC: 4.89 10*6/uL (ref 3.70–5.45)
RDW: 15.2 % — ABNORMAL HIGH (ref 11.2–14.5)
WBC: 7.1 10*3/uL (ref 3.9–10.3)
lymph#: 1.7 10*3/uL (ref 0.9–3.3)

## 2015-10-07 LAB — COMPREHENSIVE METABOLIC PANEL
ALK PHOS: 58 U/L (ref 40–150)
ALT: 29 U/L (ref 0–55)
AST: 21 U/L (ref 5–34)
Albumin: 3.7 g/dL (ref 3.5–5.0)
Anion Gap: 9 mEq/L (ref 3–11)
BUN: 16.1 mg/dL (ref 7.0–26.0)
CO2: 27 meq/L (ref 22–29)
Calcium: 9.3 mg/dL (ref 8.4–10.4)
Chloride: 103 mEq/L (ref 98–109)
Creatinine: 0.8 mg/dL (ref 0.6–1.1)
EGFR: 83 mL/min/{1.73_m2} — AB (ref >90)
GLUCOSE: 87 mg/dL (ref 70–140)
POTASSIUM: 3.6 meq/L (ref 3.5–5.1)
SODIUM: 139 meq/L (ref 136–145)
Total Bilirubin: 0.65 mg/dL (ref 0.20–1.20)
Total Protein: 7.3 g/dL (ref 6.4–8.3)

## 2015-10-07 LAB — C DIFFICILE QUICK SCREEN W PCR REFLEX
C DIFFICILE (CDIFF) INTERP: NEGATIVE
C Diff antigen: NEGATIVE
C Diff toxin: NEGATIVE

## 2015-10-07 LAB — MAGNESIUM: Magnesium: 2.3 mg/dl (ref 1.5–2.5)

## 2015-10-07 MED ORDER — FAMOTIDINE IN NACL 20-0.9 MG/50ML-% IV SOLN
INTRAVENOUS | Status: AC
  Filled 2015-10-07: qty 50

## 2015-10-07 MED ORDER — LORAZEPAM 2 MG/ML IJ SOLN
INTRAMUSCULAR | Status: AC
  Filled 2015-10-07: qty 1

## 2015-10-07 MED ORDER — SODIUM CHLORIDE 0.9 % IV SOLN
INTRAVENOUS | Status: DC
  Administered 2015-10-07: 13:00:00 via INTRAVENOUS

## 2015-10-07 MED ORDER — FAMOTIDINE IN NACL 20-0.9 MG/50ML-% IV SOLN
20.0000 mg | Freq: Two times a day (BID) | INTRAVENOUS | Status: DC
  Administered 2015-10-07: 20 mg via INTRAVENOUS

## 2015-10-07 MED ORDER — SODIUM CHLORIDE 0.9 % IV SOLN
Freq: Once | INTRAVENOUS | Status: AC
  Administered 2015-10-07: 13:00:00 via INTRAVENOUS
  Filled 2015-10-07: qty 4

## 2015-10-07 MED ORDER — LORAZEPAM 2 MG/ML IJ SOLN
0.5000 mg | Freq: Once | INTRAMUSCULAR | Status: AC
  Administered 2015-10-07: 0.5 mg via INTRAVENOUS

## 2015-10-07 NOTE — Progress Notes (Signed)
Patient reports having heavier vaginal bleeding that increased yesterday.  She said she is using 2 pads per day and is also seeing blood clots.  She received IV fluids today for nausea and diarrhea.  Vitals were taken in Medical Oncology today.

## 2015-10-07 NOTE — Progress Notes (Signed)
  Radiation Oncology         [phone] ________________________________  Name: Lindsey Hughes MRN: [id number]  Date: 10/07/2015  DOB: Jan 05, 1971  Weekly Radiation Therapy Management    ICD-9-CM ICD-10-CM   1. Malignant neoplasm of exocervix (HCC) 180.1 C53.1     Wt Readings from Last 3 Encounters:  10/07/15 210 lb 4.8 oz (95.391 kg)  10/05/15 211 lb 1.6 oz (95.754 kg)  09/23/15 208 lb 4.8 oz (94.484 kg)    Current Dose: 7.2 Gy     Planned Dose:  54+ Gy  Narrative . . . . . . . . The patient presents for routine under treatment assessment.                                   The patient is Is seen again this week. She has had significant problems with nausea since starting her radiation and chemotherapy. This seems to be more related to her chemotherapy as she had no nausea after her first radiation treatment. The patient did have IV fluid supplementation in medical oncology earlier today. Patient is taking Zofran for her nausea. She's also had significant diarrhea and C. difficile culture has been obtained. Patient also has noticed more vaginal bleeding since starting her treatments using a 2-3 pads per day.                                 Set-up films were reviewed.                                 The chart was checked. Physical Findings. . Tonette Bihari - 1 value per visit [id number]  SYSTOLIC [id number]  DIASTOLIC 60  Pulse 70  Temperature 98  Respirations 18  Weight (lb)   Height   BMI   VISIT REPORT     The lungs are clear. The heart has a regular rhythm and rate. The abdomen is soft and nontender with bowel/bowel sounds slightly decreased. Impression . . . . . . . The patient is having significant side effects with treatment as above. Medications and fluids to address these issues as above. Plan . . . . . . . . . . . . Continue treatment as planned.  ________________________________   Blair Promise, PhD, MD

## 2015-10-07 NOTE — Telephone Encounter (Signed)
Spoke with Dr. Sabas Sous is in [place] ,she will be getting IVF"S there today and can come down for rad tx with IVF's, will let Linac#2 know,spoke with Miranda RT Therapist and is aware, al;so left voice message on Dr. Mariana Kaufman Nurse phone [phone] 12:49 PM

## 2015-10-07 NOTE — Telephone Encounter (Signed)
Per voicemail from desk RN I have moved appt from 6/19 to 6/21. Notified desk RN

## 2015-10-07 NOTE — Telephone Encounter (Signed)
Called to check on Lindsey Hughes because she had not shown up for radiation today.  Her husband said she is not feeling well today and has a headache and nausea.  He will have her call back.

## 2015-10-07 NOTE — Progress Notes (Signed)
OFFICE PROGRESS NOTE   October 07, 2015   Physicians: Everitt Amber cc Dewayne Shorter, [place]* (PCP [facility]), Gery Pray, Aloha Gell  INTERVAL HISTORY:  Patient is seen, worked in after missing scheduled appointments med onc and radiation therapy earlier today due to not feeling well. She began sensitizing CDDP on 10-04-15, this with radiation for IIB poorly differentiated squamous cell carcinoma of cervix. Radiation oncology also trying to work her back in for treatment now.  Patient does not like drinking fluids and feels she has not been able to do this adequately last couple of days; she has been drinking only water and will try lemonade and other noncaffeinated fluids. She has had some nausea, probably not using home antiemetics optimally. She was a little constipated on 6-14, then diarrhea beginning 0400 today, a few loose stools. She has eaten burger on way to visit today. Note she was with stepdaughter at [facility] last week, no one else at home with diarrhea. She has increased vaginal bleeding, using 2 maxi pads yesterday (not soaked) and 1 maxi pad by midday visit today. No other bleeding. Ulcer inner lower lip; reports very occasional fever blisters, no external lesions or discomfort at lips. No fever. No SOB. Very emotional, crying at visit. Drove herself to this visit.  Peripheral IV access reportedly difficult, required 2 attempts for first chemo. Patient requests PAC. Remainder of 10 point Review of Systems negative/ unchanged.    ONCOLOGIC HISTORY Patient has history of abnormal PAPs ~ 5-7 years ago, not followed up. She presented to PCP with menorrhagia x 3-4 months, which progressed to continuous bleeding, Evaluation by Dr Pamala Hurry was complicated by cervical stenosis, and bleeding did not improve with hormonal intervention. [place] 07-15-15 showed normal right ovary, probably normal left ovary and uterine fibroid. Endometrial biopsy was accomplished on  08-12-15, pathology "weakly proliferative endometrium with glandular and stromal breakdown and abundant blood, no endometrial hyperplasia identified". She was taken for planned robotic hysterectomy by Dr Pamala Hurry on 09-03-15, however exam under anesthesia revealed abnormalities in posterior lip of cervix and hysterectomy not done. Biopsy ([id number] invasive poorly differentiated squamous cell carcinoma with involvement of endocervix and focal vascular invasion. 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 She was seen in consultation by Dr Denman George on 09-16-15, with posterior lip of cervix replaced by 6 cm tumor, with tumor beginning to infiltrate into bilateral parametria. Recommendation is for radiation with sensitizing chemotherapy. PET 09-17-15 showed intensely hypermetabolic cervical mass 5.2 x 3.3 cm (SUV 42), mild hypermetabolic uptake thru endometrium (SUV 6.2) and in high left adnexa without CT correlate, bilateral external iliac nodes 0.8 - 1.1 cm with low level uptake.Recommendation is for sensitizing chemotherapy with radiation. First weekly CDDP given 10-04-15.  Objective:  Vital signs in last 24 hours:  BP 100/60 mmHg  Pulse 80  Temp(Src) 97.8 F (36.6 C) (Oral)  Resp 18  Ht 5' 2"  (1.575 m)  Wt 210 lb 4.8 oz (95.391 kg)  BMI 38.45 kg/m2  SpO2 100% Weight down 1 lb.  Alert, oriented and appropriate. Ambulatory without difficulty. Crying during visit, seems primarily emotional rather than physical discomfort.  No alopecia  HEENT:PERRL, sclerae not icteric. Oral mucosa moist, 2 mm aphthous ulcer inner lower lip, posterior pharynx clear.  Neck supple. No JVD.  Lymphatics:no cervical,supraclavicular, axillary or inguinal adenopathy Resp: clear to auscultation bilaterally and normal percussion bilaterally Cardio: regular rate and rhythm. No gallop. GI: soft, nontender, not distended, no mass or organomegaly. Normally active bowel sounds.  Musculoskeletal/ Extremities:  without pitting edema,  cords, tenderness Neuro: no peripheral neuropathy. Otherwise nonfocal. PSYCH as above Skin without rash, ecchymosis, petechiae. Peripheral veins do not look easy for access.   Lab Results:  Results for orders placed or performed in visit on 10/07/15  CBC with Differential  Result Value Ref Range   WBC 7.1 3.9 - 10.3 10e3/uL   NEUT# 4.8 1.5 - 6.5 10e3/uL   HGB 13.5 11.6 - 15.9 g/dL   HCT 41.4 34.8 - 46.6 %   Platelets 317 145 - 400 10e3/uL   MCV 84.7 79.5 - 101.0 fL   MCH 27.7 25.1 - 34.0 pg   MCHC 32.7 31.5 - 36.0 g/dL   RBC 4.89 3.70 - 5.45 10e6/uL   RDW 15.2 (H) 11.2 - 14.5 %   lymph# 1.7 0.9 - 3.3 10e3/uL   MONO# 0.5 0.1 - 0.9 10e3/uL   Eosinophils Absolute 0.0 0.0 - 0.5 10e3/uL   Basophils Absolute 0.1 0.0 - 0.1 10e3/uL   NEUT% 67.8 38.4 - 76.8 %   LYMPH% 24.3 14.0 - 49.7 %   MONO% 6.4 0.0 - 14.0 %   EOS% 0.6 0.0 - 7.0 %   BASO% 0.9 0.0 - 2.0 %  Comprehensive metabolic panel  Result Value Ref Range   Sodium 139 136 - 145 mEq/L   Potassium 3.6 3.5 - 5.1 mEq/L   Chloride 103 98 - 109 mEq/L   CO2 27 22 - 29 mEq/L   Glucose 87 70 - 140 mg/dl   BUN 16.1 7.0 - 26.0 mg/dL   Creatinine 0.8 0.6 - 1.1 mg/dL   Total Bilirubin 0.65 0.20 - 1.20 mg/dL   Alkaline Phosphatase 58 40 - 150 U/L   AST 21 5 - 34 U/L   ALT 29 0 - 55 U/L   Total Protein 7.3 6.4 - 8.3 g/dL   Albumin 3.7 3.5 - 5.0 g/dL   Calcium 9.3 8.4 - 10.4 mg/dL   Anion Gap 9 3 - 11 mEq/L   EGFR 83 (L) >90 ml/min/1.73 m2  Magnesium  Result Value Ref Range   Magnesium 2.3 1.5 - 2.5 mg/dl    Labs reviewed with patient at time of visit.  C diff obtained following visit, resulted negative which we will let her know.  Studies/Results:  No results found.  Medications: I have reviewed the patient's current medications. OK to hold oral iron if that is causing any GI upset. Continue biotene mouthwash.   DISCUSSION Interval history reviewed. She may be doing some better than she realizes with oral hydration, and  will also try lemonade, but ok for IVF today.  IV access discussed, PAC likely will be useful. Requested by IR.  Diarrhea could be related to radiation, tho this is a bit earlier in course than I have seen. [facility] checked and fortunately was negative after visit today; if ongoing diarrhea would also check the stool infectious GI panel due to history as above; if negative would treat as radiation diarrhea with imodium etc per rad onc.  Reassured her that bleeding should improve with additional radiation and that hemoglobin is still in good range. She should let MD know if heavy bleeding.  Emotional and social distress from new diagnosis of end stage renal disease in 79 yo stepdaughter. [place] chaplain made initial contact after MD visit today.   Continue biotene for aphthous ulcer on lip, which is not direct side effect of CDDP chemo.  Let us know if external oral  lesions "fever blisters" as would use oral or topical acyclovir if so.   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 Weekly sensitizing CDDP with radiation started 10-04-15.  Additional IVF now, may need with subsequent treatments if really not hydrating adequately. She will have treatments on 6-19 and 6-26 as long as ANC >=1.5 and plt >=100k and renal function ok. I will see her on 10-25-15.  2.hypothyroid on replacement: PET shows some uptake in left lobe thyroid without discreeet nodule on CT imaging. Will need thyroid US at least after completion of this treatment if not already done by PCP (per patient's history, probably has not had thyroid US) 3.post bilateral carpal tunnel surgery and BTL 4.social stress with 75 yo step daughter just diagnosed with renal failure, apparently congenital renal problems not previously known. On wait list for renal transplant. In and out of [facility].  5.multiple bowel movements daily as baseline. Discussed possible increase/ diarrhea related to acute radiation effects. WIll be sure no infectious etiology, if not ok to use imodium per rad onc 6.continued vaginal bleeding: with Hgb and iron studies ok. Can skip days with oral iron if needed. 7.difficult peripheral IV access: central line with PAC, which will be easier for her to manage than PICC given outdoor work etc.  8.urine culture 09-23-15 insignificant group B strep. No bladder complaints now   All questions answered and patient seemed less upset by completion of visit. Appreciate all assistance from nursing and support staff also.  IVF and chemo orders confirmed. Time spent 30 min including >50% counseling and coordination of care.   LIVESAY,LENNIS P, MD   10/07/2015, 5:07 PM

## 2015-10-07 NOTE — Patient Instructions (Signed)

## 2015-10-07 NOTE — Progress Notes (Signed)
Pt was feeling very anxious during IV insertion.  Pt requesting for medication to calm her nerves. Will notify Dr. Marko Plume and obtain ativan po.  1330- Obtained ativan order for pt. 0.5mg  iv.May repeat x1 if still anxious. Pt called husband to come pick her up after infusion today. Pt drove herself to the cancer center today.   1425- Pt states that she feels more calm and relax after iv ativan was given.  1515- Pt has 20 more minutes of IVF's. Pt states that she has had some loose stools, since this mornign. Pt was able to provide lab with stool sample to rule out cdiff.   1540-VSS upon discharge. No acute distress. Denies any symptoms at this time. Instructed pt to keep hydration at home and to take nausea medications as needed every 6 hrs. Also instructed not to drive when taking ativan or xanax.

## 2015-10-08 ENCOUNTER — Ambulatory Visit
Admission: RE | Admit: 2015-10-08 | Discharge: 2015-10-08 | Disposition: A | Discharge status: Home / Self Care | Payer: BLUE CROSS/BLUE SHIELD | Source: Ambulatory Visit | Attending: Radiation Oncology | Admitting: Radiation Oncology

## 2015-10-08 ENCOUNTER — Encounter: Payer: Self-pay | Admitting: *Deleted

## 2015-10-08 ENCOUNTER — Other Ambulatory Visit: Payer: Self-pay | Admitting: Radiology

## 2015-10-08 ENCOUNTER — Telehealth: Payer: Self-pay

## 2015-10-08 DIAGNOSIS — C530 Malignant neoplasm of endocervix: Secondary | ICD-10-CM

## 2015-10-08 DIAGNOSIS — Z510 Encounter for antineoplastic radiation therapy: Secondary | ICD-10-CM | POA: Diagnosis not present

## 2015-10-08 MED ORDER — MAGIC MOUTHWASH: 5.0000 mL | ORAL | Status: DC | PRN

## 2015-10-08 MED ORDER — TRIAMCINOLONE ACETONIDE 0.1 % MT PSTE: PASTE | OROMUCOSAL | Status: DC

## 2015-10-08 NOTE — Progress Notes (Signed)
Distress Screening Referral received and support visit completed by Lorrin Jackson, chaplain.  See 10/08/15 Note.  ONCBCN DISTRESS SCREENING 10/07/2015  Screening Type Initial Screening  Distress experienced in past week (1-10) 5  Practical problem type (No Data)  Family Problem type (No Data)  Emotional problem type Nervousness/Anxiety;Adjusting to illness  Spiritual/Religous concerns type (No Data)  Information Concerns Type (No Data)  Physical Problem type (No Data)  Referral to clinical social work Yes  Referral to support programs Yes

## 2015-10-08 NOTE — Progress Notes (Signed)
Spiritual Care Note  Met with Ms Daigler in infusion per referral from Dr Marko Plume, as pt was tearful at multiple stressors; in addition to her own dx/tx (and side effects), pt's 46yo stepdaughter has newly identified need for a kidney transplant.  Eppie appears to be a private person who especially prefers to avoid crying in front of others.  She reports good support from church and husband, also recognizing his distress at his dtr's sudden serious condition (on top of pt's dx). Gently provided pastoral presence, reflective listening, normalization of feelings, and introduction of Support Center team/resources.  Offered GYN support group as a vehicle for support in which she could do more listening than talking, if she preferred.  Plan to f/u with handwritten note, as well as packet of support resources.  Also plan to follow up casually on campus, to build rapport while maximizing pt's comfort.  Please page as circumstances change or acute needs arise.  Thank you.  [place], [place], [facility] Pager [phone] Voicemail [phone]

## 2015-10-08 NOTE — Telephone Encounter (Signed)
Sent prescriptions to patient's pharmacy and reviewed medications as well as monitoring of right arm erythema  with Ms. Seccombe  as noted by below by Dr. Marko Plume.  Patient verbalized understanding.

## 2015-10-08 NOTE — Telephone Encounter (Signed)
Biotene as she has been using still good to keep this aphthous ulcer clean MMW fine prn If able to get triamcinolone acetamide dental paste, use that bid on the ulcer (whatever strength available)  Let us know if erythema at IV worsens, more tenderness etc as would need Keflex if so Let us know if fever blisters on outside of lips as would need acyclovir 200 mg tid if so

## 2015-10-08 NOTE — Telephone Encounter (Signed)
-----   Message from Gordy Levan, MD sent at 10/07/2015  6:52 PM EDT ----- On 6-16, please let her know that C diff was negative.  If still a lot of diarrhea tomorrow, would send additional studies with GI panel (I will need to speak to lab to get correct order). Note she has been with daughter at [facility], so would just be careful and check this if still diarrhea. If nothing is positive, probably is radiation related.  Please ask how her arm is above the chemo IV site. Warm soaks every hour good, may need Keflex, may need to see [place] if very bothersome.  Thank you

## 2015-10-08 NOTE — Telephone Encounter (Signed)
Told Lindsey Hughes the result of  Stool specimen as noted below by Dr. Marko Plume.  She had three loose stools last evening.  No BM today. She is feeling a lot better today.  Slept well last evening. She is able to drink fluids today.  She was inquiring about MMW as the blister inside of lip is larger today.  It is not sore. It is not affecting her eating or drinking.  The right arm remains sore and slightly red above IV site from treatment 10-04-15.  Reminded her to apply warm soaks qid.   She is afebrile.  Told Lindsey Hughes that this information would reviewed with Dr. Marko Plume.

## 2015-10-11 ENCOUNTER — Ambulatory Visit: Payer: BLUE CROSS/BLUE SHIELD

## 2015-10-11 ENCOUNTER — Ambulatory Visit
Admission: RE | Admit: 2015-10-11 | Discharge: 2015-10-11 | Disposition: A | Discharge status: Home / Self Care | Payer: BLUE CROSS/BLUE SHIELD | Source: Ambulatory Visit | Attending: Radiation Oncology | Admitting: Radiation Oncology

## 2015-10-11 ENCOUNTER — Ambulatory Visit (HOSPITAL_COMMUNITY)
Admission: RE | Admit: 2015-10-11 | Discharge: 2015-10-11 | Disposition: A | Discharge status: Home / Self Care | Payer: BLUE CROSS/BLUE SHIELD | Source: Ambulatory Visit | Attending: Oncology | Admitting: Oncology

## 2015-10-11 ENCOUNTER — Other Ambulatory Visit: Payer: Self-pay | Admitting: Oncology

## 2015-10-11 ENCOUNTER — Other Ambulatory Visit: Payer: BLUE CROSS/BLUE SHIELD

## 2015-10-11 ENCOUNTER — Encounter (HOSPITAL_COMMUNITY): Payer: Self-pay

## 2015-10-11 ENCOUNTER — Encounter (HOSPITAL_COMMUNITY)
Admission: RE | Admit: 2015-10-11 | Discharge: 2015-10-11 | Disposition: A | Discharge status: Home / Self Care | Payer: BLUE CROSS/BLUE SHIELD | Source: Ambulatory Visit | Attending: Oncology | Admitting: Oncology

## 2015-10-11 DIAGNOSIS — D649 Anemia, unspecified: Secondary | ICD-10-CM | POA: Diagnosis not present

## 2015-10-11 DIAGNOSIS — Z8249 Family history of ischemic heart disease and other diseases of the circulatory system: Secondary | ICD-10-CM | POA: Insufficient documentation

## 2015-10-11 DIAGNOSIS — C539 Malignant neoplasm of cervix uteri, unspecified: Secondary | ICD-10-CM | POA: Diagnosis present

## 2015-10-11 DIAGNOSIS — F329 Major depressive disorder, single episode, unspecified: Secondary | ICD-10-CM | POA: Insufficient documentation

## 2015-10-11 DIAGNOSIS — C531 Malignant neoplasm of exocervix: Secondary | ICD-10-CM

## 2015-10-11 DIAGNOSIS — Z510 Encounter for antineoplastic radiation therapy: Secondary | ICD-10-CM | POA: Diagnosis not present

## 2015-10-11 DIAGNOSIS — E039 Hypothyroidism, unspecified: Secondary | ICD-10-CM | POA: Diagnosis not present

## 2015-10-11 HISTORY — PX: PORTACATH PLACEMENT: SHX2246

## 2015-10-11 LAB — BASIC METABOLIC PANEL
Anion gap: 10 (ref 5–15)
BUN: 14 mg/dL (ref 6–20)
CHLORIDE: 104 mmol/L (ref 101–111)
CO2: 22 mmol/L (ref 22–32)
CREATININE: 0.81 mg/dL (ref 0.44–1.00)
Calcium: 9 mg/dL (ref 8.9–10.3)
GFR calc Af Amer: >60 mL/min (ref >60)
GFR calc non Af Amer: >60 mL/min (ref >60)
Glucose, Bld: 96 mg/dL (ref 65–99)
POTASSIUM: 4.5 mmol/L (ref 3.5–5.1)
Sodium: 136 mmol/L (ref 135–145)

## 2015-10-11 LAB — PROTIME-INR
INR: 1.09 (ref 0.00–1.49)
Prothrombin Time: 13.8 seconds (ref 11.6–15.2)

## 2015-10-11 LAB — CBC
HEMATOCRIT: 38.4 % (ref 36.0–46.0)
Hemoglobin: 12.6 g/dL (ref 12.0–15.0)
MCH: 27.6 pg (ref 26.0–34.0)
MCHC: 32.8 g/dL (ref 30.0–36.0)
MCV: 84.2 fL (ref 78.0–100.0)
PLATELETS: 317 10*3/uL (ref 150–400)
RBC: 4.56 MIL/uL (ref 3.87–5.11)
RDW: 13.7 % (ref 11.5–15.5)
WBC: 4.7 10*3/uL (ref 4.0–10.5)

## 2015-10-11 LAB — APTT: aPTT: 25 seconds (ref 24–37)

## 2015-10-11 MED ORDER — LIDOCAINE HCL 1 % IJ SOLN
INTRAMUSCULAR | Status: AC | PRN
  Administered 2015-10-11: 15 mL

## 2015-10-11 MED ORDER — LIDOCAINE HCL 1 % IJ SOLN
INTRAMUSCULAR | Status: AC
  Filled 2015-10-11: qty 20

## 2015-10-11 MED ORDER — SODIUM CHLORIDE 0.9 % IV SOLN
Freq: Once | INTRAVENOUS | Status: AC
  Administered 2015-10-11: 08:00:00 via INTRAVENOUS

## 2015-10-11 MED ORDER — MIDAZOLAM HCL 2 MG/2ML IJ SOLN
INTRAMUSCULAR | Status: AC
  Filled 2015-10-11: qty 4

## 2015-10-11 MED ORDER — FENTANYL CITRATE (PF) 100 MCG/2ML IJ SOLN
INTRAMUSCULAR | Status: AC
  Filled 2015-10-11: qty 4

## 2015-10-11 MED ORDER — MIDAZOLAM HCL 2 MG/2ML IJ SOLN
INTRAMUSCULAR | Status: AC | PRN
  Administered 2015-10-11 (×3): 1 mg via INTRAVENOUS

## 2015-10-11 MED ORDER — CEFAZOLIN SODIUM-DEXTROSE 2-4 GM/100ML-% IV SOLN
2.0000 g | INTRAVENOUS | Status: AC
  Administered 2015-10-11: 2 g via INTRAVENOUS
  Filled 2015-10-11: qty 100

## 2015-10-11 MED ORDER — FENTANYL CITRATE (PF) 100 MCG/2ML IJ SOLN
INTRAMUSCULAR | Status: AC | PRN
  Administered 2015-10-11: 25 ug via INTRAVENOUS
  Administered 2015-10-11 (×2): 50 ug via INTRAVENOUS
  Administered 2015-10-11: 25 ug via INTRAVENOUS

## 2015-10-11 MED ORDER — HEPARIN SOD (PORK) LOCK FLUSH 100 UNIT/ML IV SOLN
INTRAVENOUS | Status: AC
  Administered 2015-10-11: 500 [IU]
  Filled 2015-10-11: qty 5

## 2015-10-11 NOTE — Consult Note (Signed)
Chief Complaint: Patient was seen in consultation today for Port-A-Cath placement  Referring Physician(s): Livesay,Lennis P  Supervising Physician: Markus Daft  Patient Status: Outpatient  History of Present Illness: Lindsey Hughes is a 45 y.o. 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  Past Medical History  Diagnosis Date  . Hypothyroidism   . Anemia   . Depression   . Headache     Past Surgical History  Procedure Laterality Date  . Cesarean section  1991   . Carpal tunnel release Bilateral 2002 and 2009  . Tubal ligation    . Lasik Left   . Dilation and curettage of uterus N/A 09/03/2015    Procedure: EXAM UNDER ANESTHESIA  WITH FROZEN SECTION CERVICAL BIOPSY & ENDOCERVICAL CURRETTINGS;  Surgeon: Aloha Gell, MD;  Location: [place];  Service: Gynecology;  Laterality: N/A;    Allergies: Review of patient's allergies indicates no known allergies.  Medications: Prior to Admission medications   Medication Sig Start Date End Date Taking? Authorizing Provider  ALPRAZolam Duanne Moron) 0.25 MG tablet Take 0.25 mg by mouth as needed for anxiety. Reported on 10/07/2015   Yes Historical Provider, MD  ferrous fumarate (HEMOCYTE - 106 MG FE) 325 (106 Fe) MG TABS tablet Take 1 tablet by mouth.   Yes Historical Provider, MD  levothyroxine (SYNTHROID, LEVOTHROID) 50 MCG tablet Take 50 mcg by mouth daily.   Yes Historical Provider, MD  LORazepam (ATIVAN) 1 MG tablet Place 1/2 -1 tabled under the tongue or swallow every 6 hrs as needed for nausea.  Will make drowsy. 09/24/15  Yes Lennis Marion Downer, MD  ondansetron (ZOFRAN) 8 MG tablet Take 1 tablet (8 mg total) by mouth every 8 (eight) hours as needed for nausea (Will not make drowsy.). 09/23/15  Yes Lennis Marion Downer, MD  oxyCODONE-acetaminophen (ROXICET) 5-325 MG tablet Take 1 tablet by mouth  every 4 (four) hours as needed for severe pain. Oxycodone/percocet/roxicet, 30 tablets, no refills 10/05/15  Yes Gery Pray, MD  trimethoprim (TRIMPEX) 100 MG tablet Reported on 10/07/2015 08/27/15  Yes Historical Provider, MD  ibuprofen (ADVIL,MOTRIN) 800 MG tablet Take 800 mg by mouth every 8 (eight) hours as needed for mild pain or moderate pain. Reported on 10/07/2015 10/07/14   Historical Provider, MD  magic mouthwash SOLN Take 5-10 mLs by mouth every 4 (four) hours as needed for mouth pain. 1:1 ratio 2% viscous lidocaine;benadryl, and nystatin 10/08/15   Lennis P Livesay, MD  triamcinolone (KENALOG) 0.1 % paste Apply to mouth ulers twice a day. 10/08/15   Lennis Marion Downer, MD     Family History  Problem Relation Age of Onset  . CAD Brother     MI in his 34s  . CAD Mother     Premature disease  . Lung cancer Father   . Brain cancer Father   . Lung cancer Mother     Social History   Social History  . Marital Status: Married    Spouse Name: N/A  . Number of Children: 1  . Years of Education: N/A   Social History Main Topics  . Smoking status: Never Smoker   . Smokeless tobacco: Never Used  . Alcohol Use: No  . Drug Use: No  . Sexual Activity: Yes    Birth Control/ Protection: Surgical   Other Topics Concern  . None   Social History Narrative  Review of Systems  Constitutional: Negative for fever and chills.  Respiratory: Negative for cough and shortness of breath.   Cardiovascular: Negative for chest pain.  Gastrointestinal: Negative for nausea, vomiting, abdominal pain and blood in stool.  Genitourinary: Positive for vaginal bleeding. Negative for dysuria and hematuria.  Musculoskeletal: Negative for back pain.  Neurological: Positive for headaches.    Vital Signs: Blood pressure 114/75, heart rate 75, temperature 98.5, respirations 18, O2 sats 100% room air LMP   Physical Exam  Constitutional: She is oriented to person, place, and time. She appears  well-developed and well-nourished.  Cardiovascular: Normal rate and regular rhythm.   Pulmonary/Chest: Effort normal and breath sounds normal.  Abdominal: Soft. Bowel sounds are normal. There is no tenderness.  Musculoskeletal: Normal range of motion. She exhibits no edema.  Neurological: She is oriented to person, place, and time.    Mallampati Score:     Imaging: Nm Pet Image Initial (pi) Skull Base To Thigh  09/17/2015  CLINICAL DATA:  Initial treatment strategy for newly diagnosed invasive poorly differentiated squamous cell carcinoma of the cervix on 09/03/2015 biopsy. EXAM: NUCLEAR MEDICINE PET SKULL BASE TO THIGH TECHNIQUE: 10.5 mCi F-18 FDG was injected intravenously. Full-ring PET imaging was performed from the skull base to thigh after the radiotracer. CT data was obtained and used for attenuation correction and anatomic localization. FASTING BLOOD GLUCOSE:  Value: 96 mg/dl COMPARISON:  No prior PET-CT.  04/25/2008 CT abdomen/pelvis. FINDINGS: NECK No hypermetabolic lymph nodes in the neck. Relatively symmetric hypermetabolism in the pharyngeal tonsils is probably physiologic. Asymmetric hypermetabolism in the left thyroid lobe without discrete thyroid nodule on the CT images with max SUV 10.2. CHEST Relatively symmetric brown fat hypermetabolism in the bilateral axilla and posterior far lower cervical triangles. Brown fat hypermetabolism in the left superior mediastinum between the left common carotid and left subclavian arteries. Brown fat hypermetabolism throughout the paraspinal intercostal spaces in the chest. No hypermetabolic axillary, mediastinal or hilar nodes. No pleural effusions. No acute consolidative airspace disease or significant pulmonary nodules. ABDOMEN/PELVIS No abnormal hypermetabolic activity within the liver, pancreas, adrenal glands, or spleen. Intensely hypermetabolic 5.2 x 3.3 cm mass in the cervix with max SUV 42.3 (series 4/image 173). There is mild hypermetabolism  throughout the endometrium with max SUV 6.2. There is asymmetric vague mild hypermetabolism in the high left adnexa (just superior to the normal appearing non hypermetabolic left ovary) without associated discrete left adnexal mass with max SUV 4.7. Mildly enlarged 1.1 cm right external iliac node (series 4/image 150) with low level hypermetabolism (max SUV 3.6). Top-normal size 0.8 cm left external iliac node (series 4/image 162) with low level hypermetabolism (max SUV 3.3). No additional hypermetabolic nodes in the abdomen or pelvis. SKELETON No focal hypermetabolic activity to suggest skeletal metastasis. IMPRESSION: 1. Intensely hypermetabolic 5.2 x 3.3 cm cervical mass, consistent with primary cervix malignancy. 2. Mild hypermetabolism throughout the endometrium. Asymmetric mild high left adnexal hypermetabolism without discrete left adnexal mass. In a premenopausal patient, these are nonspecific findings that could be due to physiologic uptake, with local tumor spread into the uterus and left tubal tissues not excluded. 3. Low level hypermetabolism within mildly enlarged bilateral external iliac lymph nodes, suspicious for pelvic nodal metastases. 4. No hypermetabolic metastatic disease in the abdomen, chest or neck. 5. Asymmetric hypermetabolism in the left thyroid lobe without discrete thyroid nodule on the CT images. Differential includes occult hypermetabolic left thyroid lobe nodule versus asymmetric thyroiditis. Recommend correlation with serum thyroid function tests and  thyroid ultrasound. Electronically Signed   By: Ilona Sorrel M.D.   On: 09/17/2015 12:35    Labs:  CBC:  Recent Labs  09/21/15 1455 10/04/15 0823 10/07/15 1142 10/11/15 0800  WBC 6.0 7.6 7.1 4.7  HGB 12.3 13.3 13.5 12.6  HCT 37.7 40.9 41.4 38.4  PLT 356 341 317 317    COAGS:  Recent Labs  10/11/15 0800  INR 1.09  APTT 25    BMP:  Recent Labs  11/19/14 1944 08/27/15 1245 09/21/15 1455 10/04/15 0823  10/07/15 1142 10/11/15 0800  NA 136 139 141 138 139 136  K 3.9 4.2 3.6 3.7 3.6 4.5  CL 102 105  --   --   --  104  CO2 26 25 26 24 27 22   GLUCOSE 101* 96 94 93 87 96  BUN 16 10 12.7 11.5 16.1 14  CALCIUM 8.7* 9.4 9.1 9.4 9.3 9.0  CREATININE 0.80 0.83 0.8 0.9 0.8 0.81  GFRNONAA >60 >60  --   --   --  >60  GFRAA >60 >60  --   --   --  >60    LIVER FUNCTION TESTS:  Recent Labs  09/21/15 1455 10/04/15 0823 10/07/15 1142  BILITOT 0.60 0.44 0.65  AST 20 20 21   ALT 20 20 29   ALKPHOS 61 65 58  PROT 7.3 7.7 7.3  ALBUMIN 3.8 3.9 3.7    TUMOR MARKERS: No results for input(s): AFPTM, CEA, CA199, CHROMGRNA in the last 8760 hours.  Assessment and Plan: 45 y.o. 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 All of the patient's questions were answered, patient is agreeable to proceed. Consent signed and in chart.     Thank you for this interesting consult.  I greatly enjoyed meeting Lindsey Hughes and look forward to participating in their care.  A copy of this report was sent to the requesting provider on this date.  Electronically Signed: D. Rowe Robert 10/11/2015, 8:48 AM   I spent a total of 20 minutes in face to face in clinical consultation, greater than 50% of which was counseling/coordinating care for Port-A-Cath placement

## 2015-10-11 NOTE — Discharge Instructions (Signed)
Implanted Port Home Guide °An implanted port is a type of central line that is placed under the skin. Central lines are used to provide IV access when treatment or nutrition needs to be given through a person's veins. Implanted ports are used for long-term IV access. An implanted port may be placed because:  °· You need IV medicine that would be irritating to the small veins in your hands or arms.   °· You need long-term IV medicines, such as antibiotics.   °· You need IV nutrition for a long period.   °· You need frequent blood draws for lab tests.   °· You need dialysis.   °Implanted ports are usually placed in the chest area, but they can also be placed in the upper arm, the abdomen, or the leg. An implanted port has two main parts:  °· Reservoir. The reservoir is round and will appear as a small, raised area under your skin. The reservoir is the part where a needle is inserted to give medicines or draw blood.   °· Catheter. The catheter is a thin, flexible tube that extends from the reservoir. The catheter is placed into a large vein. Medicine that is inserted into the reservoir goes into the catheter and then into the vein.   °HOW WILL I CARE FOR MY INCISION SITE? °Do not get the incision site wet. Bathe or shower as directed by your health care provider.  °HOW IS MY PORT ACCESSED? °Special steps must be taken to access the port:  °· Before the port is accessed, a numbing cream can be placed on the skin. This helps numb the skin over the port site.   °· Your health care provider uses a sterile technique to access the port. °· Your health care provider must put on a mask and sterile gloves. °· The skin over your port is cleaned carefully with an antiseptic and allowed to dry. °· The port is gently pinched between sterile gloves, and a needle is inserted into the port. °· Only "non-coring" port needles should be used to access the port. Once the port is accessed, a blood return should be checked. This helps  ensure that the port is in the vein and is not clogged.   °· If your port needs to remain accessed for a constant infusion, a clear (transparent) bandage will be placed over the needle site. The bandage and needle will need to be changed every week, or as directed by your health care provider.   °· Keep the bandage covering the needle clean and dry. Do not get it wet. Follow your health care provider's instructions on how to take a shower or bath while the port is accessed.   °· If your port does not need to stay accessed, no bandage is needed over the port.   °WHAT IS FLUSHING? °Flushing helps keep the port from getting clogged. Follow your health care provider's instructions on how and when to flush the port. Ports are usually flushed with saline solution or a medicine called heparin. The need for flushing will depend on how the port is used.  °· If the port is used for intermittent medicines or blood draws, the port will need to be flushed:   °· After medicines have been given.   °· After blood has been drawn.   °· As part of routine maintenance.   °· If a constant infusion is running, the port may not need to be flushed.   °HOW LONG WILL MY PORT STAY IMPLANTED? °The port can stay in for as long as your health care   provider thinks it is needed. When it is time for the port to come out, surgery will be done to remove it. The procedure is similar to the one performed when the port was put in.  °WHEN SHOULD I SEEK IMMEDIATE MEDICAL CARE? °When you have an implanted port, you should seek immediate medical care if:  °· You notice a bad smell coming from the incision site.   °· You have swelling, redness, or drainage at the incision site.   °· You have more swelling or pain at the port site or the surrounding area.   °· You have a fever that is not controlled with medicine. °  °This information is not intended to replace advice given to you by your health care provider. Make sure you discuss any questions you have with  your health care provider. °  °Document Released: 04/10/2005 Document Revised: 01/29/2013 Document Reviewed: 12/16/2012 °Elsevier Interactive Patient Education ©2016 [employer] °Implanted Port Insertion, Care After °Refer to this sheet in the next few weeks. These instructions provide you with information on caring for yourself after your procedure. Your health care provider may also give you more specific instructions. Your treatment has been planned according to current medical practices, but problems sometimes occur. Call your health care provider if you have any problems or questions after your procedure. °WHAT TO EXPECT AFTER THE PROCEDURE °After your procedure, it is typical to have the following:  °· Discomfort at the port insertion site. Ice packs to the area will help. °· Bruising on the skin over the port. This will subside in 3-4 days. °HOME CARE INSTRUCTIONS °· After your port is placed, you will get a manufacturer's information card. The card has information about your port. Keep this card with you at all times.   °· Know what kind of port you have. There are many types of ports available.   °· Wear a medical alert bracelet in case of an emergency. This can help alert health care workers that you have a port.   °· The port can stay in for as long as your health care provider believes it is necessary.   °· A home health care nurse may give medicines and take care of the port.   °· You or a family member can get special training and directions for giving medicine and taking care of the port at home.   °SEEK MEDICAL CARE IF:  °· Your port does not flush or you are unable to get a blood return.   °· You have a fever or chills. °SEEK IMMEDIATE MEDICAL CARE IF: °· You have new fluid or pus coming from your incision.   °· You notice a bad smell coming from your incision site.   °· You have swelling, pain, or more redness at the incision or port site.   °· You have chest pain or shortness of breath. °  °This  information is not intended to replace advice given to you by your health care provider. Make sure you discuss any questions you have with your health care provider. °  °Document Released: 01/29/2013 Document Revised: 04/15/2013 Document Reviewed: 01/29/2013 °Elsevier Interactive Patient Education ©2016 [employer] °Moderate Conscious Sedation, Adult °Sedation is the use of medicines to promote relaxation and relieve discomfort and anxiety. Moderate conscious sedation is a type of sedation. Under moderate conscious sedation you are less alert than normal but are still able to respond to instructions or stimulation. Moderate conscious sedation is used during short medical and dental procedures. It is milder than deep sedation or general anesthesia and   allows you to return to your regular activities sooner. LET [facility] CARE PROVIDER KNOW ABOUT:   Any allergies you have.  All medicines you are taking, including vitamins, herbs, eye drops, creams, and over-the-counter medicines.  Use of steroids (by mouth or creams).  Previous problems you or members of your family have had with the use of anesthetics.  Any blood disorders you have.  Previous surgeries you have had.  Medical conditions you have.  Possibility of pregnancy, if this applies.  Use of cigarettes, alcohol, or illegal drugs. RISKS AND COMPLICATIONS Generally, this is a safe procedure. However, as with any procedure, problems can occur. Possible problems include:  Oversedation.  Trouble breathing on your own. You may need to have a breathing tube until you are awake and breathing on your own.  Allergic reaction to any of the medicines used for the procedure. BEFORE THE PROCEDURE  You may have blood tests done. These tests can help show how well your kidneys and liver are working. They can also show how well your blood clots.  A physical exam will be done.  Only take medicines as directed by your health care provider.  You may need to stop taking medicines (such as blood thinners, aspirin, or nonsteroidal anti-inflammatory drugs) before the procedure.   Do not eat or drink at least 6 hours before the procedure or as directed by your health care provider.  Arrange for a responsible adult, family member, or friend to take you home after the procedure. He or she should stay with you for at least 24 hours after the procedure, until the medicine has worn off. PROCEDURE   An intravenous (IV) catheter will be inserted into one of your veins. Medicine will be able to flow directly into your body through this catheter. You may be given medicine through this tube to help prevent pain and help you relax.  The medical or dental procedure will be done. AFTER THE PROCEDURE  You will stay in a recovery area until the medicine has worn off. Your blood pressure and pulse will be checked.   Depending on the procedure you had, you may be allowed to go home when you can tolerate liquids and your pain is under control.   This information is not intended to replace advice given to you by your health care provider. Make sure you discuss any questions you have with your health care provider.   Document Released: 01/03/2001 Document Revised: 05/01/2014 Document Reviewed: 12/16/2012 Elsevier Interactive Patient Education 2016 [place] [employer]

## 2015-10-11 NOTE — Procedures (Signed)
Placement of right jugular portacath.  Tip at SVC/RA junction.  Minimal blood loss and no immediate complication.   

## 2015-10-12 ENCOUNTER — Other Ambulatory Visit: Payer: Self-pay

## 2015-10-12 ENCOUNTER — Encounter: Payer: Self-pay | Admitting: Radiation Oncology

## 2015-10-12 ENCOUNTER — Ambulatory Visit
Admission: RE | Admit: 2015-10-12 | Discharge: 2015-10-12 | Disposition: A | Discharge status: Home / Self Care | Payer: BLUE CROSS/BLUE SHIELD | Source: Ambulatory Visit | Attending: Radiation Oncology | Admitting: Radiation Oncology

## 2015-10-12 VITALS — BP 126/83 | HR 81 | Temp 97.7°F | Ht 62.0 in | Wt 207.9 lb

## 2015-10-12 DIAGNOSIS — C531 Malignant neoplasm of exocervix: Secondary | ICD-10-CM

## 2015-10-12 DIAGNOSIS — Z510 Encounter for antineoplastic radiation therapy: Secondary | ICD-10-CM | POA: Diagnosis not present

## 2015-10-12 MED ORDER — LIDOCAINE-PRILOCAINE 2.5-2.5 % EX CREA
TOPICAL_CREAM | CUTANEOUS | Status: DC
Start: 2015-10-12 — End: 2015-12-23

## 2015-10-12 MED ORDER — OXYCODONE-ACETAMINOPHEN 5-325 MG PO TABS: 1.0000 | ORAL_TABLET | ORAL | Status: DC | PRN

## 2015-10-12 NOTE — Progress Notes (Addendum)
Loria Lewallen has completed 7 fractions to her pelvis.  She reports having pain in her right neck from having her port put in yesterday.  She is asking for a refill oxycodone/acetaminophen.  She reports the nausea is much better.  She denies having any bladder issues.  She continues to have vaginal bleeding but does report having clots after she urinates.  She denies having diarrhea.  She denies having fatigue or skin irritation.  She will have chemotherapy tomorrow.  BP 126/83 mmHg  Pulse 81  Temp(Src) 97.7 F (36.5 C) (Oral)  Ht 5\' 2"  (1.575 m)  Wt 207 lb 14.4 oz (94.303 kg)  BMI 38.02 kg/m2  SpO2 100%   Wt Readings from Last 3 Encounters:  10/12/15 207 lb 14.4 oz (94.303 kg)  10/11/15 204 lb (92.534 kg)  10/07/15 210 lb 4.8 oz (95.391 kg)

## 2015-10-12 NOTE — Progress Notes (Signed)
  Radiation Oncology         [phone] ________________________________  Name: Lindsey Hughes MRN: [id number]  Date: 10/12/2015  DOB: 14-Dec-1970  Weekly Radiation Therapy Management    ICD-9-CM ICD-10-CM   1. 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 . . . . . . . The patient presents for routine under treatment assessment.                                    Lindsey Hughes has completed 7 fractions to her pelvis. She reports having pain in her right neck from having her port put in yesterday. She is asking for a refill oxycodone/acetaminophen. She reports the nausea is much better. She denies having any bladder issues. She continues to have vaginal bleeding but does report having clots after she urinates. She denies having diarrhea. She denies having fatigue or skin irritation. She will have chemotherapy tomorrow.                                   Set-up films were reviewed.                                 The chart was checked. Physical Findings. . Danley Danker Vitals:   10/12/15 0820  BP: 126/83  Pulse: 81  Temp: 97.7 F (36.5 C)   Wt Readings from Last 3 Encounters:  10/12/15 207 lb 14.4 oz (94.303 kg)  10/11/15 204 lb (92.534 kg)  10/07/15 210 lb 4.8 oz (95.391 kg)    The lungs are clear. The heart has a regular rhythm and rate. Some swelling at Port-a-Cath site, bandaged.   Impression . . . . . . . The patient is having  side effects with treatment as above, but better this week. Medications and fluids to address these issues as above.  Plan . . . . . . . . . . . . Continue treatment as planned. Percocet refilled today.  ________________________________   Blair Promise, PhD, MD  This document serves as a record of services personally performed by Gery Pray, MD. It was created on his behalf by Derek Mound, a trained medical scribe. The creation of this record is based on the scribe's personal  observations and the provider's statements to them. This document has been checked and approved by the attending provider.

## 2015-10-13 ENCOUNTER — Ambulatory Visit: Payer: BLUE CROSS/BLUE SHIELD

## 2015-10-13 ENCOUNTER — Ambulatory Visit
Admission: RE | Admit: 2015-10-13 | Discharge: 2015-10-13 | Disposition: A | Discharge status: Home / Self Care | Payer: BLUE CROSS/BLUE SHIELD | Source: Ambulatory Visit | Attending: Radiation Oncology | Admitting: Radiation Oncology

## 2015-10-13 ENCOUNTER — Other Ambulatory Visit (HOSPITAL_BASED_OUTPATIENT_CLINIC_OR_DEPARTMENT_OTHER): Payer: BLUE CROSS/BLUE SHIELD

## 2015-10-13 ENCOUNTER — Ambulatory Visit (HOSPITAL_BASED_OUTPATIENT_CLINIC_OR_DEPARTMENT_OTHER): Payer: BLUE CROSS/BLUE SHIELD

## 2015-10-13 VITALS — BP 121/83 | HR 90 | Temp 98.5°F | Resp 18

## 2015-10-13 DIAGNOSIS — Z5111 Encounter for antineoplastic chemotherapy: Secondary | ICD-10-CM | POA: Diagnosis not present

## 2015-10-13 DIAGNOSIS — C531 Malignant neoplasm of exocervix: Secondary | ICD-10-CM

## 2015-10-13 DIAGNOSIS — Z510 Encounter for antineoplastic radiation therapy: Secondary | ICD-10-CM | POA: Diagnosis not present

## 2015-10-13 LAB — CBC WITH DIFFERENTIAL/PLATELET
BASO%: 0.6 % (ref 0.0–2.0)
BASOS ABS: 0 10*3/uL (ref 0.0–0.1)
EOS ABS: 0.1 10*3/uL (ref 0.0–0.5)
EOS%: 1.9 % (ref 0.0–7.0)
HCT: 37.8 % (ref 34.8–46.6)
HGB: 12.4 g/dL (ref 11.6–15.9)
LYMPH%: 14.6 % (ref 14.0–49.7)
MCH: 27.8 pg (ref 25.1–34.0)
MCHC: 32.8 g/dL (ref 31.5–36.0)
MCV: 84.6 fL (ref 79.5–101.0)
MONO#: 0.3 10*3/uL (ref 0.1–0.9)
MONO%: 8.3 % (ref 0.0–14.0)
NEUT#: 3 10*3/uL (ref 1.5–6.5)
NEUT%: 74.6 % (ref 38.4–76.8)
Platelets: 248 10*3/uL (ref 145–400)
RBC: 4.46 10*6/uL (ref 3.70–5.45)
RDW: 14.7 % — ABNORMAL HIGH (ref 11.2–14.5)
WBC: 4 10*3/uL (ref 3.9–10.3)
lymph#: 0.6 10*3/uL — ABNORMAL LOW (ref 0.9–3.3)

## 2015-10-13 LAB — COMPREHENSIVE METABOLIC PANEL
ALK PHOS: 67 U/L (ref 40–150)
ALT: 32 U/L (ref 0–55)
AST: 20 U/L (ref 5–34)
Albumin: 3.6 g/dL (ref 3.5–5.0)
Anion Gap: 8 mEq/L (ref 3–11)
BUN: 11.1 mg/dL (ref 7.0–26.0)
CHLORIDE: 103 meq/L (ref 98–109)
CO2: 28 meq/L (ref 22–29)
Calcium: 9.2 mg/dL (ref 8.4–10.4)
Creatinine: 0.9 mg/dL (ref 0.6–1.1)
EGFR: 82 mL/min/{1.73_m2} — AB (ref >90)
GLUCOSE: 112 mg/dL (ref 70–140)
POTASSIUM: 4 meq/L (ref 3.5–5.1)
SODIUM: 139 meq/L (ref 136–145)
Total Bilirubin: 0.31 mg/dL (ref 0.20–1.20)
Total Protein: 7 g/dL (ref 6.4–8.3)

## 2015-10-13 LAB — MAGNESIUM: Magnesium: 2.4 mg/dl (ref 1.5–2.5)

## 2015-10-13 MED ORDER — ONDANSETRON HCL 40 MG/20ML IJ SOLN
Freq: Once | INTRAMUSCULAR | Status: AC
  Administered 2015-10-13: 13:00:00 via INTRAVENOUS
  Filled 2015-10-13: qty 4

## 2015-10-13 MED ORDER — LORAZEPAM 2 MG/ML IJ SOLN
1.0000 mg | Freq: Once | INTRAMUSCULAR | Status: AC | PRN
  Administered 2015-10-13: 1 mg via INTRAVENOUS

## 2015-10-13 MED ORDER — SODIUM CHLORIDE 0.9 % IV SOLN
Freq: Once | INTRAVENOUS | Status: AC
  Administered 2015-10-13: 11:00:00 via INTRAVENOUS

## 2015-10-13 MED ORDER — SODIUM CHLORIDE 0.9 % IV SOLN
Freq: Once | INTRAVENOUS | Status: AC
  Administered 2015-10-13: 13:00:00 via INTRAVENOUS
  Filled 2015-10-13: qty 5

## 2015-10-13 MED ORDER — POTASSIUM CHLORIDE 2 MEQ/ML IV SOLN
Freq: Once | INTRAVENOUS | Status: AC
  Administered 2015-10-13: 11:00:00 via INTRAVENOUS
  Filled 2015-10-13: qty 10

## 2015-10-13 MED ORDER — SODIUM CHLORIDE 0.9 % IV SOLN
40.0000 mg/m2 | Freq: Once | INTRAVENOUS | Status: AC
  Administered 2015-10-13: 81 mg via INTRAVENOUS
  Filled 2015-10-13: qty 81

## 2015-10-13 MED ORDER — LORAZEPAM 2 MG/ML IJ SOLN
INTRAMUSCULAR | Status: AC
  Filled 2015-10-13: qty 1

## 2015-10-13 MED ORDER — HEPARIN SOD (PORK) LOCK FLUSH 100 UNIT/ML IV SOLN
500.0000 [IU] | Freq: Once | INTRAVENOUS | Status: AC | PRN
  Administered 2015-10-13: 500 [IU]
  Filled 2015-10-13: qty 5

## 2015-10-13 MED ORDER — SODIUM CHLORIDE 0.9% FLUSH
10.0000 mL | INTRAVENOUS | Status: DC | PRN
  Administered 2015-10-13: 10 mL
  Filled 2015-10-13: qty 10

## 2015-10-13 NOTE — Progress Notes (Signed)
Reviewed antiemetics and po hydration with patient.  She will be here tomorrow and Friday for RT and knows to call us if she is not doing well.

## 2015-10-13 NOTE — Progress Notes (Signed)
Pt states that she does not want labs drawn from her port a cath at this time.  States she would like to put her cream on now and wait to be accessed in the chemo room.  Pt labs drawn from her arm.

## 2015-10-13 NOTE — Patient Instructions (Addendum)
[facility] Discharge Instructions for Patients Receiving Chemotherapy  Today you received the following chemotherapy agents: Cisplatin and IV fluid hydration.  To help prevent nausea and vomiting after your treatment, we encourage you to take your nausea medication as directed.  If you develop nausea and vomiting that is not controlled by your nausea medication, call the clinic.   BELOW ARE SYMPTOMS THAT SHOULD BE REPORTED IMMEDIATELY:  *FEVER GREATER THAN 100.5 F  *CHILLS WITH OR WITHOUT FEVER  NAUSEA AND VOMITING THAT IS NOT CONTROLLED WITH YOUR NAUSEA MEDICATION  *UNUSUAL SHORTNESS OF BREATH  *UNUSUAL BRUISING OR BLEEDING  TENDERNESS IN MOUTH AND THROAT WITH OR WITHOUT PRESENCE OF ULCERS  *URINARY PROBLEMS  *BOWEL PROBLEMS  UNUSUAL RASH Items with * indicate a potential emergency and should be followed up as soon as possible.  Feel free to call the clinic you have any questions or concerns. The clinic phone number is [phone].  Please show the [facility] at check-in to the Emergency Department and triage nurse.  Oral Mucositis Oral mucositis is a mouth condition that may develop from treatments for cancer. With this condition, sores may appear on your lips, gums, tongue, throat, and the top (roof) or bottom (floor) of your mouth. CAUSES Oral mucositis can happen to anyone who is being treated with cancer therapies, including:  Cancer medicines (chemotherapy).  Radiation therapy.  Bone marrow transplants and stem cell transplants. Cancer treatments can damage the lining of the mouth, and that causes this condition. Oral mucositis is not caused by infection. However, the sores can become infected after they form. Infection can make oral mucositis worse. RISK FACTORS This condition is more likely to develop in people:  With poor oral hygiene.  With dental problems or oral diseases.  Who use any tobacco product, including cigarettes,  chewing tobacco, and electronic cigarettes.  Who drink alcohol.  Who have other medical conditions, such as diabetes, HIV, AIDS, or kidney disease.  Who do not drink enough clear fluids.  Who wear dentures that do not fit correctly.  Who have cancers that primarily affect the blood.  Who are female. SYMPTOMS Symptoms can vary from mild to severe. Symptoms are usually seen 7-10 days after cancer treatment has started. Symptoms include:  Mouth sores. These sores may bleed.  Color changes inside the mouth. Red, shiny areas may appear.  White patches or pus in the mouth.  Pain in the mouth and throat. This can make it painful to speak.  Dryness and a burning feeling in the mouth.  Saliva that is dry and thick.  Trouble eating, drinking, and swallowing. This can lead to weight loss. DIAGNOSIS This condition can be diagnosed with a physical exam. TREATMENT Treatment depends on the severity of the condition. Oral mucositis often heals on its own. Sometimes, changes in the cancer treatment can help. Treatment may include medicines, such as:  An antibiotic medicine to fight infection, if present.  Medicine to help the cells in your mouth heal more quickly. Medicine may also be given to help control pain. This may include:  Pain relievers that are swished around in the mouth. These make the mouth numb to ease the pain (topical anesthetics).   Mouth rinses.  Prescribed, medicated gels. The gel coats the mouth. This protects nerve endings and lessens pain.  Narcotic pain medicines. HOME CARE INSTRUCTIONS Medicines  If you were prescribed an antibiotic medicine, finish all of it even if you start to feel better.  Take or  apply medicines only as directed by your health care provider. Lifestyle  Keeping your mouth clean and germ-free is important. To maintain good oral hygiene:  Brush your teeth carefully with a soft, nylon-bristled toothbrush at least two times each day.  Use a gentle toothpaste. Ask your health care provider for a toothpaste recommendation.  Floss your teeth every day.  Have your teeth cleaned regularly as recommended by your dentist.  Rinse your mouth after every meal or as directed by your health care provider. Do not use mouthwash that contains alcohol. Ask your health care provider for a mouthwash recommendation.  Do not use any tobacco products, including cigarettes, chewing tobacco, and electronic cigarettes. If you need help quitting, ask your health care provider.  Avoid eating:  Hot and spicy foods.  Citrus.  Foods that have sharp edges, such as chips.  Sugary foods, such as candy.  Do not drink alcohol. General Instructions  Clean the sores as directed by your health care provider.  If your lips are dry or cracked, apply a water-based moisturizer to your lips as needed.  Try sucking on ice chips or sugar-free frozen pops. This may help with pain. This also keeps your mouth moist.  Drink enough fluid to keep your urine clear or pale yellow.  If you are losing weight, talk with your health care provider.  If you have dentures, take them out often as directed by your health care provider.  Keep all follow-up visits as directed by your health care provider. This is important. SEEK MEDICAL CARE IF:  You have mouth pain or throat pain.  You are having more trouble swallowing.  Your symptoms get worse.  You have new symptoms.  Your pain is not controlled with medicine. SEEK IMMEDIATE MEDICAL CARE IF:  You have a lot of bleeding in your mouth.  You have trouble speaking.  You develop new, open, or draining sores in your mouth.  You cannot swallow solid food or liquids.  You have a fever.   This information is not intended to replace advice given to you by your health care provider. Make sure you discuss any questions you have with your health care provider.   Document Released: 11/25/2010 Document  Revised: 08/25/2014 Document Reviewed: 04/06/2014 Elsevier Interactive Patient Education [employer].

## 2015-10-14 ENCOUNTER — Ambulatory Visit
Admission: RE | Admit: 2015-10-14 | Discharge: 2015-10-14 | Disposition: A | Discharge status: Home / Self Care | Payer: BLUE CROSS/BLUE SHIELD | Source: Ambulatory Visit | Attending: Radiation Oncology | Admitting: Radiation Oncology

## 2015-10-14 DIAGNOSIS — Z510 Encounter for antineoplastic radiation therapy: Secondary | ICD-10-CM | POA: Diagnosis not present

## 2015-10-15 ENCOUNTER — Ambulatory Visit
Admission: RE | Admit: 2015-10-15 | Discharge: 2015-10-15 | Disposition: A | Discharge status: Home / Self Care | Payer: BLUE CROSS/BLUE SHIELD | Source: Ambulatory Visit | Attending: Radiation Oncology | Admitting: Radiation Oncology

## 2015-10-15 DIAGNOSIS — Z510 Encounter for antineoplastic radiation therapy: Secondary | ICD-10-CM | POA: Diagnosis not present

## 2015-10-17 ENCOUNTER — Other Ambulatory Visit: Payer: Self-pay | Admitting: Oncology

## 2015-10-18 ENCOUNTER — Other Ambulatory Visit (HOSPITAL_BASED_OUTPATIENT_CLINIC_OR_DEPARTMENT_OTHER): Payer: BLUE CROSS/BLUE SHIELD

## 2015-10-18 ENCOUNTER — Ambulatory Visit
Admission: RE | Admit: 2015-10-18 | Discharge: 2015-10-18 | Disposition: A | Discharge status: Home / Self Care | Payer: BLUE CROSS/BLUE SHIELD | Source: Ambulatory Visit | Attending: Radiation Oncology | Admitting: Radiation Oncology

## 2015-10-18 ENCOUNTER — Other Ambulatory Visit (HOSPITAL_COMMUNITY)
Admission: RE | Admit: 2015-10-18 | Discharge: 2015-10-18 | Disposition: A | Discharge status: Home / Self Care | Payer: BLUE CROSS/BLUE SHIELD | Source: Ambulatory Visit | Attending: Oncology | Admitting: Oncology

## 2015-10-18 ENCOUNTER — Ambulatory Visit (HOSPITAL_BASED_OUTPATIENT_CLINIC_OR_DEPARTMENT_OTHER): Payer: BLUE CROSS/BLUE SHIELD

## 2015-10-18 ENCOUNTER — Encounter: Payer: Self-pay | Admitting: Radiation Oncology

## 2015-10-18 VITALS — BP 107/71 | HR 86 | Temp 97.9°F | Resp 18

## 2015-10-18 DIAGNOSIS — C531 Malignant neoplasm of exocervix: Secondary | ICD-10-CM

## 2015-10-18 DIAGNOSIS — Z5111 Encounter for antineoplastic chemotherapy: Secondary | ICD-10-CM

## 2015-10-18 DIAGNOSIS — R197 Diarrhea, unspecified: Secondary | ICD-10-CM

## 2015-10-18 DIAGNOSIS — Z510 Encounter for antineoplastic radiation therapy: Secondary | ICD-10-CM | POA: Diagnosis not present

## 2015-10-18 LAB — CBC WITH DIFFERENTIAL/PLATELET
BASO%: 0.4 % (ref 0.0–2.0)
Basophils Absolute: 0 10*3/uL (ref 0.0–0.1)
EOS%: 4.3 % (ref 0.0–7.0)
Eosinophils Absolute: 0.2 10*3/uL (ref 0.0–0.5)
HEMATOCRIT: 36.2 % (ref 34.8–46.6)
HEMOGLOBIN: 12.1 g/dL (ref 11.6–15.9)
LYMPH#: 0.5 10*3/uL — AB (ref 0.9–3.3)
LYMPH%: 9.8 % — ABNORMAL LOW (ref 14.0–49.7)
MCH: 28 pg (ref 25.1–34.0)
MCHC: 33.4 g/dL (ref 31.5–36.0)
MCV: 84 fL (ref 79.5–101.0)
MONO#: 0.4 10*3/uL (ref 0.1–0.9)
MONO%: 8.3 % (ref 0.0–14.0)
NEUT%: 77.2 % — ABNORMAL HIGH (ref 38.4–76.8)
NEUTROS ABS: 3.9 10*3/uL (ref 1.5–6.5)
PLATELETS: 165 10*3/uL (ref 145–400)
RBC: 4.31 10*6/uL (ref 3.70–5.45)
RDW: 14.4 % (ref 11.2–14.5)
WBC: 5 10*3/uL (ref 3.9–10.3)

## 2015-10-18 LAB — COMPREHENSIVE METABOLIC PANEL
ALT: 34 U/L (ref 0–55)
ANION GAP: 9 meq/L (ref 3–11)
AST: 20 U/L (ref 5–34)
Albumin: 3.3 g/dL — ABNORMAL LOW (ref 3.5–5.0)
Alkaline Phosphatase: 61 U/L (ref 40–150)
BUN: 15.9 mg/dL (ref 7.0–26.0)
CHLORIDE: 103 meq/L (ref 98–109)
CO2: 26 meq/L (ref 22–29)
CREATININE: 1 mg/dL (ref 0.6–1.1)
Calcium: 9.1 mg/dL (ref 8.4–10.4)
EGFR: 72 mL/min/{1.73_m2} — ABNORMAL LOW (ref >90)
Glucose: 107 mg/dl (ref 70–140)
Potassium: 4 mEq/L (ref 3.5–5.1)
Sodium: 139 mEq/L (ref 136–145)
Total Bilirubin: <0.3 mg/dL (ref 0.20–1.20)
Total Protein: 6.7 g/dL (ref 6.4–8.3)

## 2015-10-18 LAB — TECHNOLOGIST REVIEW

## 2015-10-18 LAB — MAGNESIUM: Magnesium: 2 mg/dl (ref 1.5–2.5)

## 2015-10-18 MED ORDER — SODIUM CHLORIDE 0.9 % IV SOLN
Freq: Once | INTRAVENOUS | Status: AC
  Administered 2015-10-18: 11:00:00 via INTRAVENOUS

## 2015-10-18 MED ORDER — SODIUM CHLORIDE 0.9 % IV SOLN
40.0000 mg/m2 | Freq: Once | INTRAVENOUS | Status: AC
  Administered 2015-10-18: 81 mg via INTRAVENOUS
  Filled 2015-10-18: qty 81

## 2015-10-18 MED ORDER — SODIUM CHLORIDE 0.9 % IV SOLN
Freq: Once | INTRAVENOUS | Status: AC
  Administered 2015-10-18: 13:00:00 via INTRAVENOUS
  Filled 2015-10-18: qty 4

## 2015-10-18 MED ORDER — LORAZEPAM 2 MG/ML IJ SOLN
1.0000 mg | Freq: Once | INTRAMUSCULAR | Status: AC | PRN
  Administered 2015-10-18: 1 mg via INTRAVENOUS

## 2015-10-18 MED ORDER — LORAZEPAM 2 MG/ML IJ SOLN
INTRAMUSCULAR | Status: AC
  Filled 2015-10-18: qty 1

## 2015-10-18 MED ORDER — SODIUM CHLORIDE 0.9 % IV SOLN
Freq: Once | INTRAVENOUS | Status: AC
  Administered 2015-10-18: 13:00:00 via INTRAVENOUS
  Filled 2015-10-18: qty 5

## 2015-10-18 MED ORDER — SODIUM CHLORIDE 0.9% FLUSH
10.0000 mL | INTRAVENOUS | Status: DC | PRN
  Administered 2015-10-18: 10 mL
  Filled 2015-10-18: qty 10

## 2015-10-18 MED ORDER — POTASSIUM CHLORIDE 2 MEQ/ML IV SOLN
Freq: Once | INTRAVENOUS | Status: AC
  Administered 2015-10-18: 11:00:00 via INTRAVENOUS
  Filled 2015-10-18: qty 10

## 2015-10-18 MED ORDER — HEPARIN SOD (PORK) LOCK FLUSH 100 UNIT/ML IV SOLN
500.0000 [IU] | Freq: Once | INTRAVENOUS | Status: AC | PRN
Start: 2015-10-18 — End: 2015-10-18
  Administered 2015-10-18: 500 [IU]
  Filled 2015-10-18: qty 5

## 2015-10-18 NOTE — Patient Instructions (Signed)
[place] Discharge Instructions for Patients Receiving Chemotherapy  Today you received the following chemotherapy agents:  Cisplatin.  To help prevent nausea and vomiting after your treatment, we encourage you to take your nausea medication as prescribed.  If you develop nausea and vomiting that is not controlled by your nausea medication, call the clinic.   BELOW ARE SYMPTOMS THAT SHOULD BE REPORTED IMMEDIATELY:  *FEVER GREATER THAN 100.5 F  *CHILLS WITH OR WITHOUT FEVER  NAUSEA AND VOMITING THAT IS NOT CONTROLLED WITH YOUR NAUSEA MEDICATION  *UNUSUAL SHORTNESS OF BREATH  *UNUSUAL BRUISING OR BLEEDING  TENDERNESS IN MOUTH AND THROAT WITH OR WITHOUT PRESENCE OF ULCERS  *URINARY PROBLEMS  *BOWEL PROBLEMS  UNUSUAL RASH Items with * indicate a potential emergency and should be followed up as soon as possible.  Feel free to call the clinic you have any questions or concerns. The clinic phone number is [phone].  Please show the [facility] at check-in to the Emergency Department and triage nurse.    Diarrhea Diarrhea is frequent loose and watery bowel movements. It can cause you to feel weak and dehydrated. Dehydration can cause you to become tired and thirsty, have a dry mouth, and have decreased urination that often is dark yellow. Diarrhea is a sign of another problem, most often an infection that will not last long. In most cases, diarrhea typically lasts 2-3 days. However, it can last longer if it is a sign of something more serious. It is important to treat your diarrhea as directed by your caregiver to lessen or prevent future episodes of diarrhea. CAUSES  Some common causes include:  Gastrointestinal infections caused by viruses, bacteria, or parasites.  Food poisoning or food allergies.  Certain medicines, such as antibiotics, chemotherapy, and laxatives.  Artificial sweeteners and fructose.  Digestive disorders. HOME CARE  INSTRUCTIONS  Ensure adequate fluid intake (hydration): Have 1 cup (8 oz) of fluid for each diarrhea episode. Avoid fluids that contain simple sugars or sports drinks, fruit juices, whole milk products, and sodas. Your urine should be clear or pale yellow if you are drinking enough fluids. Hydrate with an oral rehydration solution that you can purchase at pharmacies, retail stores, and online. You can prepare an oral rehydration solution at home by mixing the following ingredients together:   - tsp table salt.   tsp baking soda.   tsp salt substitute containing potassium chloride.  1  tablespoons sugar.  1 L (34 oz) of water.  Certain foods and beverages may increase the speed at which food moves through the gastrointestinal (GI) tract. These foods and beverages should be avoided and include:  Caffeinated and alcoholic beverages.  High-fiber foods, such as raw fruits and vegetables, nuts, seeds, and whole grain breads and cereals.  Foods and beverages sweetened with sugar alcohols, such as xylitol, sorbitol, and mannitol.  Some foods may be well tolerated and may help thicken stool including:  Starchy foods, such as rice, toast, pasta, low-sugar cereal, oatmeal, grits, baked potatoes, crackers, and bagels.  Bananas.  Applesauce.  Add probiotic-rich foods to help increase healthy bacteria in the GI tract, such as yogurt and fermented milk products.  Wash your hands well after each diarrhea episode.  Only take over-the-counter or prescription medicines as directed by your caregiver.  Take a warm bath to relieve any burning or pain from frequent diarrhea episodes. SEEK IMMEDIATE MEDICAL CARE IF:   You are unable to keep fluids down.  You have persistent vomiting.  You have blood in your stool, or your stools are black and tarry.  You do not urinate in 6-8 hours, or there is only a small amount of very dark urine.  You have abdominal pain that increases or  localizes.  You have weakness, dizziness, confusion, or light-headedness.  You have a severe headache.  Your diarrhea gets worse or does not get better.  You have a fever or persistent symptoms for more than 2-3 days.  You have a fever and your symptoms suddenly get worse. MAKE SURE YOU:   Understand these instructions.  Will watch your condition.  Will get help right away if you are not doing well or get worse.   This information is not intended to replace advice given to you by your health care provider. Make sure you discuss any questions you have with your health care provider.   Document Released: 03/31/2002 Document Revised: 05/01/2014 Document Reviewed: 12/17/2011 Elsevier Interactive Patient Education 2016 Elsevier Inc.   Dehydration, Adult Dehydration means your body does not have as much fluid or water as it needs. It happens when you take in less fluid than you lose. Your kidneys, brain, and heart will not work properly without the right amount of fluids.  Dehydration can range from mild to severe. It should be treated right away to help prevent it from becoming severe. HOME CARE  Drink enough fluid to keep your pee (urine) clear or pale yellow.  Drink water or fluid slowly by taking small sips. You can also try sucking on ice cubes.  Have food or drinks that contain electrolytes. Examples include bananas and sports drinks.  Take over-the-counter and prescription medicines only as told by your doctor.  Prepare oral rehydration solution (ORS) according to the instructions that came with it. Take sips of ORS every 5 minutes until your pee returns to normal.  If you are throwing up (vomiting) or have watery poop (diarrhea), keep trying to drink water, ORS, or both.  If you have watery poop, avoid:  Drinks with caffeine.  Fruit juice.  Milk.  Carbonated soft drinks.  Do not take salt tablets. This can lead to having too much sodium in your body  (hypernatremia). GET HELP IF:  You cannot eat or drink without throwing up.  You have had mild watery poop for longer than 24 hours.  You have a fever. GET HELP RIGHT AWAY IF:   You have very strong thirst.  You have very bad watery poop.  You have not peed in 6-8 hours, or you have peed only a small amount of very dark pee.  You have shriveled skin.  You are dizzy, confused, or both.   This information is not intended to replace advice given to you by your health care provider. Make sure you discuss any questions you have with your health care provider.   Document Released: 02/04/2009 Document Revised: 12/30/2014 Document Reviewed: 08/26/2014 Elsevier Interactive Patient Education 2016 Elsevier Inc.   Cisplatin injection What is this medicine? CISPLATIN (SIS pla tin) is a chemotherapy drug. It targets fast dividing cells, like cancer cells, and causes these cells to die. This medicine is used to treat many types of cancer like bladder, ovarian, and testicular cancers. This medicine may be used for other purposes; ask your health care provider or pharmacist if you have questions. What should I tell my health care provider before I take this medicine? They need to know if you have any of these conditions: -blood disorders -hearing problems -  kidney disease -recent or ongoing radiation therapy -an unusual or allergic reaction to cisplatin, carboplatin, other chemotherapy, other medicines, foods, dyes, or preservatives -pregnant or trying to get pregnant -breast-feeding How should I use this medicine? This drug is given as an infusion into a vein. It is administered in a hospital or clinic by a specially trained health care professional. Talk to your pediatrician regarding the use of this medicine in children. Special care may be needed. Overdosage: If you think you have taken too much of this medicine contact a poison control center or emergency room at once. NOTE: This  medicine is only for you. Do not share this medicine with others. What if I miss a dose? It is important not to miss a dose. Call your doctor or health care professional if you are unable to keep an appointment. What may interact with this medicine? -dofetilide -foscarnet -medicines for seizures -medicines to increase blood counts like filgrastim, pegfilgrastim, sargramostim -probenecid -pyridoxine used with altretamine -rituximab -some antibiotics like amikacin, gentamicin, neomycin, polymyxin B, streptomycin, tobramycin -sulfinpyrazone -vaccines -zalcitabine Talk to your doctor or health care professional before taking any of these medicines: -acetaminophen -aspirin -ibuprofen -ketoprofen -naproxen This list may not describe all possible interactions. Give your health care provider a list of all the medicines, herbs, non-prescription drugs, or dietary supplements you use. Also tell them if you smoke, drink alcohol, or use illegal drugs. Some items may interact with your medicine. What should I watch for while using this medicine? Your condition will be monitored carefully while you are receiving this medicine. You will need important blood work done while you are taking this medicine. This drug may make you feel generally unwell. This is not uncommon, as chemotherapy can affect healthy cells as well as cancer cells. Report any side effects. Continue your course of treatment even though you feel ill unless your doctor tells you to stop. In some cases, you may be given additional medicines to help with side effects. Follow all directions for their use. Call your doctor or health care professional for advice if you get a fever, chills or sore throat, or other symptoms of a cold or flu. Do not treat yourself. This drug decreases your body's ability to fight infections. Try to avoid being around people who are sick. This medicine may increase your risk to bruise or bleed. Call your doctor or  health care professional if you notice any unusual bleeding. Be careful brushing and flossing your teeth or using a toothpick because you may get an infection or bleed more easily. If you have any dental work done, tell your dentist you are receiving this medicine. Avoid taking products that contain aspirin, acetaminophen, ibuprofen, naproxen, or ketoprofen unless instructed by your doctor. These medicines may hide a fever. Do not become pregnant while taking this medicine. Women should inform their doctor if they wish to become pregnant or think they might be pregnant. There is a potential for serious side effects to an unborn child. Talk to your health care professional or pharmacist for more information. Do not breast-feed an infant while taking this medicine. Drink fluids as directed while you are taking this medicine. This will help protect your kidneys. Call your doctor or health care professional if you get diarrhea. Do not treat yourself. What side effects may I notice from receiving this medicine? Side effects that you should report to your doctor or health care professional as soon as possible: -allergic reactions like skin rash, itching or hives, swelling  of the face, lips, or tongue -signs of infection - fever or chills, cough, sore throat, pain or difficulty passing urine -signs of decreased platelets or bleeding - bruising, pinpoint red spots on the skin, black, tarry stools, nosebleeds -signs of decreased red blood cells - unusually weak or tired, fainting spells, lightheadedness -breathing problems -changes in hearing -gout pain -low blood counts - This drug may decrease the number of white blood cells, red blood cells and platelets. You may be at increased risk for infections and bleeding. -nausea and vomiting -pain, swelling, redness or irritation at the injection site -pain, tingling, numbness in the hands or feet -problems with balance, movement -trouble passing urine or  change in the amount of urine Side effects that usually do not require medical attention (report to your doctor or health care professional if they continue or are bothersome): -changes in vision -loss of appetite -metallic taste in the mouth or changes in taste This list may not describe all possible side effects. Call your doctor for medical advice about side effects. You may report side effects to FDA at 1-800-FDA-1088. Where should I keep my medicine? This drug is given in a hospital or clinic and will not be stored at home. NOTE: This sheet is a summary. It may not cover all possible information. If you have questions about this medicine, talk to your doctor, pharmacist, or health care provider.    2016, Elsevier/Gold Standard. (2007-07-16 14:40:54)

## 2015-10-19 ENCOUNTER — Ambulatory Visit
Admission: RE | Admit: 2015-10-19 | Discharge: 2015-10-19 | Disposition: A | Discharge status: Home / Self Care | Payer: BLUE CROSS/BLUE SHIELD | Source: Ambulatory Visit | Attending: Radiation Oncology | Admitting: Radiation Oncology

## 2015-10-19 ENCOUNTER — Other Ambulatory Visit: Payer: Self-pay

## 2015-10-19 ENCOUNTER — Encounter: Payer: Self-pay | Admitting: Radiation Oncology

## 2015-10-19 ENCOUNTER — Telehealth: Payer: Self-pay | Admitting: *Deleted

## 2015-10-19 ENCOUNTER — Other Ambulatory Visit: Payer: Self-pay | Admitting: Oncology

## 2015-10-19 VITALS — BP 121/91 | HR 81 | Temp 97.9°F | Ht 62.0 in | Wt 209.8 lb

## 2015-10-19 DIAGNOSIS — R350 Frequency of micturition: Secondary | ICD-10-CM

## 2015-10-19 DIAGNOSIS — C531 Malignant neoplasm of exocervix: Secondary | ICD-10-CM

## 2015-10-19 DIAGNOSIS — N390 Urinary tract infection, site not specified: Secondary | ICD-10-CM

## 2015-10-19 DIAGNOSIS — Z510 Encounter for antineoplastic radiation therapy: Secondary | ICD-10-CM | POA: Diagnosis not present

## 2015-10-19 DIAGNOSIS — E860 Dehydration: Secondary | ICD-10-CM

## 2015-10-19 MED ORDER — CIPROFLOXACIN HCL 250 MG PO TABS: 250.0000 mg | ORAL_TABLET | Freq: Two times a day (BID) | ORAL | Status: DC

## 2015-10-19 NOTE — Progress Notes (Signed)
  Radiation Oncology         [phone] ________________________________  Name: Lindsey Hughes MRN: [id number]  Date: 10/19/2015  DOB: 04-Apr-1971  Weekly Radiation Therapy Management    ICD-9-CM ICD-10-CM   1. 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 . . . . . . . The patient presents for routine under treatment assessment.  Lindsey Hughes has completed 12 fractions to her pelvis. She reports having pain due to a headache today which she is rating as a 6/10. She is taking oxycodone/acetaminophen which helps. She reports having diarrhea about 7 times per day over the weekend. She had 2 episodes this morning. She is taking liquid Imodium twice a day. She denies having any bladder issues or vaginal bleeding. She reports a little bit of rectal bleeding on Sunday. She had chemotherapy yesterday. She reports having fatigue over the weekend. She reports having nausea this morning and took Zofran. Her pelvic pain is improving.                                  Set-up films were reviewed.                                 The chart was checked. Physical Findings. . Danley Danker Vitals:   10/19/15 0824  BP: 121/91  Pulse: 81  Temp: 97.9 F (36.6 C)   Wt Readings from Last 3 Encounters:  10/19/15 209 lb 12.8 oz (95.165 kg)  10/12/15 207 lb 14.4 oz (94.303 kg)  10/11/15 204 lb (92.534 kg)   Lungs are clear to auscultation bilaterally. Heart has regular rate and rhythm. No palpable cervical, supraclavicular, or axillary adenopathy. Abdomen soft, non-tender, normal bowel sounds.  Impression . . . . . . . The patient is having side effects with treatment as above.  Plan . . . . . . . . . . . . Continue treatment as planned. I advised the patient that she could take Imodium 5-6 times a day. If her diarrhea does not improve, we will prescribe Lomotil. Patient was given a sitz bath for her rectal  irritation.  ________________________________   Blair Promise, PhD, MD  This document serves as a record of services personally performed by Gery Pray, MD. It was created on his behalf by Darcus Austin, a trained medical scribe. The creation of this record is based on the scribe's personal observations and the provider's statements to them. This document has been checked and approved by the attending provider.

## 2015-10-19 NOTE — Telephone Encounter (Signed)
See collaborative nurse's note.

## 2015-10-19 NOTE — Telephone Encounter (Signed)
Pt is appreciative of call back. Per Dr Edwyna Shell we will order cipro BID to be started tonight. We will get u/a c&s tomorrow with XRT appt. Pt requesting a liter of fluids. Per Dr Edwyna Shell OK for fluiids. Appt set up at [facility] 10/20/15 at 0900 and pt informed. Instructions to pt that if bladder gets painful or tightness over pubic bone develops, if pt gets fever or n/v, if pt does not void at all, she needs to go to the ER.

## 2015-10-19 NOTE — Progress Notes (Signed)
Lindsey Hughes has completed 12 fractions to her pelvis.  She reports having pain due to a headache today which she is rating at a 6/10.  She is taking oxycodone/acetaminophen which helps.  She reports having diarrhea about 7 times per day over the weekend.  She had 2 episodes this morning.  She is taking liquid Imodium twice a day.  She denies having any bladder issues.  She also denies having vaginal bleeding.  She had chemotherapy yesterday.  She reports having fatigue over the weekend.  She reports having nausea this morning and has taken Zofran.  BP 121/91 mmHg  Pulse 81  Temp(Src) 97.9 F (36.6 C) (Oral)  Ht 5\' 2"  (1.575 m)  Wt 209 lb 12.8 oz (95.165 kg)  BMI 38.36 kg/m2  SpO2 99%   Wt Readings from Last 3 Encounters:  10/19/15 209 lb 12.8 oz (95.165 kg)  10/12/15 207 lb 14.4 oz (94.303 kg)  10/11/15 204 lb (92.534 kg)

## 2015-10-19 NOTE — Telephone Encounter (Signed)
Needs UA/CS

## 2015-10-19 NOTE — Telephone Encounter (Signed)
"  Yesterday evening after I received chemotherapy (CDDP), I began having to go to the bathroom every 5 mins.  Urge is real intense but only a few drops come out.  The urine is darker yellow. No increase in temperature yet.  I am drinking lots of fluids and eating.  Take imodium for diarrhea and today only went twice.  From Saturday till today was going six to ten times a day.  Next scheduled F/U is 10-21-2015 but can't wait until then."

## 2015-10-19 NOTE — Progress Notes (Signed)
Patient was given a sitz bath and was instructed on how to use it for skin irritation as needed.

## 2015-10-20 ENCOUNTER — Ambulatory Visit
Admission: RE | Admit: 2015-10-20 | Discharge: 2015-10-20 | Disposition: A | Discharge status: Home / Self Care | Payer: BLUE CROSS/BLUE SHIELD | Source: Ambulatory Visit | Attending: Radiation Oncology | Admitting: Radiation Oncology

## 2015-10-20 ENCOUNTER — Ambulatory Visit (HOSPITAL_COMMUNITY)
Admission: RE | Admit: 2015-10-20 | Discharge: 2015-10-20 | Disposition: A | Discharge status: Home / Self Care | Payer: BLUE CROSS/BLUE SHIELD | Source: Ambulatory Visit | Attending: Physician Assistant | Admitting: Physician Assistant

## 2015-10-20 ENCOUNTER — Other Ambulatory Visit (HOSPITAL_BASED_OUTPATIENT_CLINIC_OR_DEPARTMENT_OTHER): Payer: BLUE CROSS/BLUE SHIELD

## 2015-10-20 DIAGNOSIS — C531 Malignant neoplasm of exocervix: Secondary | ICD-10-CM

## 2015-10-20 DIAGNOSIS — Z510 Encounter for antineoplastic radiation therapy: Secondary | ICD-10-CM | POA: Diagnosis not present

## 2015-10-20 DIAGNOSIS — N390 Urinary tract infection, site not specified: Secondary | ICD-10-CM

## 2015-10-20 DIAGNOSIS — E860 Dehydration: Secondary | ICD-10-CM | POA: Insufficient documentation

## 2015-10-20 LAB — URINALYSIS, MICROSCOPIC - CHCC
BILIRUBIN (URINE): NEGATIVE
Glucose: NEGATIVE mg/dL
Ketones: NEGATIVE mg/dL
Nitrite: NEGATIVE
PH: 6 (ref 4.6–8.0)
SPECIFIC GRAVITY, URINE: 1.03 (ref 1.003–1.035)
UROBILINOGEN UR: 0.2 mg/dL (ref 0.2–1)

## 2015-10-20 MED ORDER — SODIUM CHLORIDE 0.9 % IJ SOLN
10.0000 mL | Freq: Once | INTRAMUSCULAR | Status: AC
  Administered 2015-10-20: 10 mL via INTRAVENOUS

## 2015-10-20 MED ORDER — SODIUM CHLORIDE 0.9 % IV SOLN: 1000.0000 mL | Freq: Once | INTRAVENOUS | Status: AC

## 2015-10-20 MED ORDER — SODIUM CHLORIDE 0.9 % IV SOLN
INTRAVENOUS | Status: AC
  Administered 2015-10-20: 09:00:00 via INTRAVENOUS

## 2015-10-20 MED ORDER — HEPARIN SOD (PORK) LOCK FLUSH 100 UNIT/ML IV SOLN
500.0000 [IU] | Freq: Once | INTRAVENOUS | Status: AC
  Administered 2015-10-20: 500 [IU] via INTRAVENOUS
  Filled 2015-10-20: qty 5

## 2015-10-20 NOTE — Discharge Instructions (Signed)
Pt received 1 liter of normal saline today. °

## 2015-10-20 NOTE — Progress Notes (Signed)
Diagnosis Association: Dehydration (276.51  Provider: Dr. Marko Plume  Procedure: Pt's porta cath was accessed and 1 liter of normal saline infused.  Pt tolerated the procedure well.  Post procedure: Pt alert, oriented and ambulatory at wheelchair.

## 2015-10-20 NOTE — Telephone Encounter (Addendum)
S/w pt about urinary symptoms. She says the urgency is better and the volume has increased. She states she never had much pain with urination but that is better too. She is presently at [facility] getting fluids.

## 2015-10-21 ENCOUNTER — Other Ambulatory Visit (HOSPITAL_COMMUNITY)
Admission: RE | Admit: 2015-10-21 | Discharge: 2015-10-21 | Disposition: A | Discharge status: Home / Self Care | Payer: BLUE CROSS/BLUE SHIELD | Source: Ambulatory Visit | Attending: Oncology | Admitting: Oncology

## 2015-10-21 ENCOUNTER — Ambulatory Visit (HOSPITAL_BASED_OUTPATIENT_CLINIC_OR_DEPARTMENT_OTHER): Payer: BLUE CROSS/BLUE SHIELD | Admitting: Oncology

## 2015-10-21 ENCOUNTER — Other Ambulatory Visit: Payer: Self-pay

## 2015-10-21 ENCOUNTER — Encounter: Payer: Self-pay | Admitting: Oncology

## 2015-10-21 ENCOUNTER — Encounter: Payer: Self-pay | Admitting: *Deleted

## 2015-10-21 ENCOUNTER — Telehealth: Payer: Self-pay | Admitting: *Deleted

## 2015-10-21 ENCOUNTER — Telehealth: Payer: Self-pay | Admitting: Oncology

## 2015-10-21 ENCOUNTER — Ambulatory Visit
Admission: RE | Admit: 2015-10-21 | Discharge: 2015-10-21 | Disposition: A | Discharge status: Home / Self Care | Payer: BLUE CROSS/BLUE SHIELD | Source: Ambulatory Visit | Attending: Radiation Oncology | Admitting: Radiation Oncology

## 2015-10-21 VITALS — BP 113/71 | HR 85 | Temp 98.4°F | Resp 18 | Ht 62.0 in | Wt 210.5 lb

## 2015-10-21 DIAGNOSIS — C530 Malignant neoplasm of endocervix: Secondary | ICD-10-CM | POA: Diagnosis not present

## 2015-10-21 DIAGNOSIS — C531 Malignant neoplasm of exocervix: Secondary | ICD-10-CM | POA: Insufficient documentation

## 2015-10-21 DIAGNOSIS — R300 Dysuria: Secondary | ICD-10-CM | POA: Diagnosis not present

## 2015-10-21 DIAGNOSIS — Z510 Encounter for antineoplastic radiation therapy: Secondary | ICD-10-CM | POA: Diagnosis not present

## 2015-10-21 DIAGNOSIS — Z95828 Presence of other vascular implants and grafts: Secondary | ICD-10-CM

## 2015-10-21 DIAGNOSIS — E039 Hypothyroidism, unspecified: Secondary | ICD-10-CM | POA: Diagnosis not present

## 2015-10-21 LAB — C DIFFICILE QUICK SCREEN W PCR REFLEX
C Diff antigen: NEGATIVE
C Diff interpretation: NEGATIVE
C Diff toxin: NEGATIVE

## 2015-10-21 MED ORDER — PROCHLORPERAZINE MALEATE 10 MG PO TABS: 10.0000 mg | ORAL_TABLET | Freq: Four times a day (QID) | ORAL | Status: DC | PRN

## 2015-10-21 MED ORDER — PANTOPRAZOLE SODIUM 40 MG PO TBEC: 40.0000 mg | DELAYED_RELEASE_TABLET | Freq: Every day | ORAL | Status: DC

## 2015-10-21 MED ORDER — LORAZEPAM 1 MG PO TABS: ORAL_TABLET | ORAL | Status: DC

## 2015-10-21 MED ORDER — OXYCODONE-ACETAMINOPHEN 5-325 MG PO TABS: 1.0000 | ORAL_TABLET | ORAL | Status: DC | PRN

## 2015-10-21 NOTE — Progress Notes (Signed)
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 She had #3 CDDP on 10-18-15 with IVF on 10-20-15.  Patient has been very anxious about treatments and side effects, tho overall is doing adequately from treatment standpoint. She reported difficulty voiding and dysuria on 6-28, that information late in day such that we were not able to get urine specimen and felt necessary to begin cipro 250 mg bid x 3 days empirically. She is not having those symptoms now, no fever, no hematuria and urine specimen today not obviously infected after 2 doses of antibiotic, culture pending.  Patient complains of HA, possibly related to zofran, which she is using frequently without full resolution of low grade nausea. She has not used ativan other than at hs. She has some GERD, not on medication for that. Oral ulcer 6-16 resolved. She continues at least 4 stools daily with some abdominal cramping, tho note baseline for bowel movements x years is also in this range. She has not started sitz baths as instructed by rad onc, encouraged her to do this. She stopped thyroid medication ~ 3 weeks ago "Could not tell it was helping", this by PCP and probably best to continue at least for now.  She has not tried xanax. She reports not sleeping more than 2-3 hours at night "I have too much on my mind".  No vaginal bleeding in at least 3 days. No other bleeding. Is eating and drinking some.  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  Remainder of 10 point Review of Systems negative / unchanged.  PAC placed by IR 10-11-15  Young step daughter is to meed with transplant team at [facility] within the week (newly diagnosed end stage renal disease, 46 yo).   ONCOLOGIC  HISTORY  Patient has history of abnormal PAPs ~ 5-7 years ago, not followed up. She presented to PCP with menorrhagia x 3-4 months, which progressed to continuous bleeding, Evaluation by Dr Pamala Hurry was complicated by cervical stenosis, and bleeding did not improve with hormonal intervention. [place] 07-15-15 showed normal right ovary, probably normal left ovary and uterine fibroid. Endometrial biopsy was accomplished on 08-12-15, pathology "weakly proliferative endometrium with glandular and stromal breakdown and abundant blood, no endometrial hyperplasia identified". She was taken for planned robotic hysterectomy by Dr Pamala Hurry on 09-03-15, however exam under anesthesia revealed abnormalities in posterior lip of cervix and hysterectomy not done. Biopsy [id number] invasive poorly differentiated squamous cell carcinoma with involvement of endocervix and focal vascular invasion. 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 She was seen in consultation by Dr Denman George on 09-16-15, with posterior lip of cervix replaced by 6 cm tumor, with tumor beginning to infiltrate into bilateral parametria. Recommendation is for radiation with sensitizing chemotherapy. PET 09-17-15 showed intensely hypermetabolic cervical mass 5.2 x 3.3 cm (SUV 42), mild hypermetabolic uptake thru endometrium (SUV 6.2) and in high left adnexa without CT correlate, bilateral external iliac nodes 0.8 - 1.1 cm with low level uptake.Recommendation is for sensitizing chemotherapy with radiation. First weekly CDDP given 10-04-15.   Objective:  Vital signs in last 24 hours:  BP 113/71 mmHg  Pulse 85  Temp(Src) 98.4 F (36.9 C) (Oral)  Resp 18  Ht 5\' 2"  (1.575 m)  Wt 210 lb 8 oz (95.482 kg)  BMI  38.49 kg/m2  SpO2 100% Weight up 2 lbs Alert, oriented and appropriate. Ambulatory without difficulty, easily able to get on and off exam table No alopecia  HEENT:PERRL, sclerae not icteric. Oral mucosa moist without lesions, posterior pharynx clear.  Neck  supple. No JVD.  Lymphatics:no cervical,supraclavicular or inguinal adenopathy Resp: clear to auscultation bilaterally and normal percussion bilaterally Cardio: regular rate and rhythm. No gallop. GI: soft, nontender in epigastrium or elsewhere, not distended, no mass or organomegaly. Normally active bowel sounds. Surgical incision not remarkable. Musculoskeletal/ Extremities: without pitting edema, cords, tenderness Neuro: no peripheral neuropathy. Otherwise nonfocal. PSYCH tearful at times, seems more relaxed by completion of visit Skin without rash, ecchymosis, petechiae Portacath-without erythema or tenderness  Lab Results:  Results for orders placed or performed in visit on 10/20/15  Urine Culture  Result Value Ref Range   Urine Culture, Routine Final report (A)    Urine Culture result 1 Comment (A)   Urinalysis with microscopic  Result Value Ref Range   Glucose Negative Negative mg/dL   Bilirubin (Urine) Negative Negative   Ketones Negative Negative mg/dL   Specific Gravity, Urine 1.030 1.003 - 1.035   Blood Trace Negative   pH 6.0 4.6 - 8.0   Protein < 30 Negative- <30 mg/dL   Urobilinogen, UR 0.2 0.2 - 1 mg/dL   Nitrite Negative Negative   Leukocyte Esterase Trace Negative   RBC / HPF 3-6 0 - 2   WBC, UA 3-6 0 - 2   Bacteria, UA Few Negative- Trace   Epithelial Cells Moderate Negative- Few    CBC and CMET Mg from 10-18-15 noted  Studies/Results:  No results found.  Medications: I have reviewed the patient's current medications. Will try compazine instead of zofran due to HA and ongoing nausea. Add protonix also with the nausea and GERD.  Recommended she take xanax at hs tonight/ would not use ativan with the xanax She is taking iron on empty stomach  DISCUSSION We are encouraged that vaginal bleeding has stopped. I have explained that renal function and hydration status looks good now. Medications as above, instructions reviewed carefully Complete cipro another  1.5 days. If recurrent symptoms will need cx prior to start of antibiotics Sitz baths after each BM. Reminded patient that her baseline is at least 4 BMs daily.   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 Weekly sensitizing CDDP with radiation started 10-04-15. Additional IVF now, may need with subsequent treatments if really not hydrating adequately. She will have treatments on 7-3, 7-10, 7-17 as long as ANC >=1.5 and plt >=100k and renal function ok. I will see her on 10-25-15.  2.hypothyroid: she should resume medication as instructed by PCP: PET shows some uptake in left lobe thyroid without discreeet nodule on CT imaging. Will need thyroid US at least after completion of this treatment if not already done by PCP (per patient's history, probably has not had thyroid US) 3.post bilateral carpal tunnel surgery and BTL 4.social stress with 34 yo step daughter recently diagnosed with renal failure, apparently congenital renal problems not previously known. On wait list for renal transplant. In and out of [facility] support staff aware 5.multiple bowel movements daily as baseline. Discussed possible increase/ diarrhea related to acute radiation effects. No c diff, so ok to use imodium per rad onc 6.vaginal bleeding stopped in last few days, likely showing radiation effect: with Hgb and  iron studies ok. Can skip days with oral iron if needed. 7.PAC in 8.urine symptoms 10-20-15: better today. Will follow up urine cx results tho this done after 2 doses cipro. Complete 3 days cipro and follow 9.chemo nausea and GERD: try compazine, add protonix  All questions answered. Chemo orders confirmed. TIme spent 25 min including >50% counseling and coordination of care. Route PCP    Gordy Levan, MD   10/23/2015, 1:55 PM

## 2015-10-21 NOTE — Progress Notes (Signed)
[facility] Psychosocial Distress Screening Clinical Social Work  Clinical Social Work was referred by distress screening protocol.  The patient scored a 5 on the Psychosocial Distress Thermometer which indicates moderate distress. Clinical Social Worker attempted to contact patient to assess for distress and other psychosocial needs. CSW left voicemail requesting patient return call.  ONCBCN DISTRESS SCREENING 10/07/2015  Screening Type Initial Screening  Distress experienced in past week (1-10) 5  Practical problem type (No Data)  Family Problem type (No Data)  Emotional problem type Nervousness/Anxiety;Adjusting to illness  Spiritual/Religous concerns type (No Data)  Information Concerns Type (No Data)  Physical Problem type (No Data)  Referral to clinical social work Yes  Referral to support programs Yes    Polo Riley, MSW, LCSW, OSW-C Clinical Social Worker [place] [phone]

## 2015-10-21 NOTE — Telephone Encounter (Signed)
FYI I was there this morning.  [place] [facility] has not received orders for nausea or stomach medicines.  There number is [phone]."  Advised provider seeing patients and will send today.

## 2015-10-21 NOTE — Patient Instructions (Signed)
Try xanax at bedtime tonight to see if that lets you sleep. Don't take ativan (lorazepam) with xanax  Try the new nausea medicine Compazine (prochlorperazine) if needed, instead of zofran as the zofran may be causing more headache.  Take new medicine for stomach acid daily  Start back on your thyroid medicine  Use sitz baths after every bowel movement, then air dry well. Can use desitin after radiation each day, be sure that is off before your radiation treatments

## 2015-10-21 NOTE — Telephone Encounter (Signed)
Printed patients updated schedule and patient instructions today per LL 6/29 pof

## 2015-10-22 ENCOUNTER — Ambulatory Visit
Admission: RE | Admit: 2015-10-22 | Discharge: 2015-10-22 | Disposition: A | Discharge status: Home / Self Care | Payer: BLUE CROSS/BLUE SHIELD | Source: Ambulatory Visit | Attending: Radiation Oncology | Admitting: Radiation Oncology

## 2015-10-22 ENCOUNTER — Ambulatory Visit (HOSPITAL_COMMUNITY)
Admission: RE | Admit: 2015-10-22 | Discharge: 2015-10-22 | Disposition: A | Discharge status: Home / Self Care | Payer: BLUE CROSS/BLUE SHIELD | Source: Ambulatory Visit | Attending: Radiation Oncology | Admitting: Radiation Oncology

## 2015-10-22 ENCOUNTER — Telehealth: Payer: Self-pay

## 2015-10-22 DIAGNOSIS — R609 Edema, unspecified: Secondary | ICD-10-CM

## 2015-10-22 DIAGNOSIS — Z510 Encounter for antineoplastic radiation therapy: Secondary | ICD-10-CM | POA: Diagnosis not present

## 2015-10-22 LAB — URINE CULTURE

## 2015-10-22 MED ORDER — PROCHLORPERAZINE MALEATE 10 MG PO TABS: 10.0000 mg | ORAL_TABLET | Freq: Four times a day (QID) | ORAL | Status: DC | PRN

## 2015-10-22 MED ORDER — PANTOPRAZOLE SODIUM 40 MG PO TBEC: 40.0000 mg | DELAYED_RELEASE_TABLET | Freq: Every day | ORAL | Status: DC

## 2015-10-22 NOTE — Telephone Encounter (Signed)
-----   Message from Gordy Levan, MD sent at 10/22/2015 12:40 PM EDT ----- Labs seen and need follow up: please let her know no c difficile infection causing the diarrhea, so likely this is her usual sensitive bowels + radiation. FIne to use imodium 2 tablets after each loose stool up to 8 tablets in 24 hours. Lots of fluids po   thanks

## 2015-10-22 NOTE — Progress Notes (Signed)
Lindsey Hughes reports having swelling above her right port a cath that started 2 days ago.  She said it hurts her neck at night when she is trying to sleep.  Called Selena Lesser, NP who advised that patient should go to either IR or a Psychologist, sport and exercise, whoever put it in.  Called IR and arranged for patient to be seen this morning. Verbal order put in per Dr. Isidore Moos for IR Rad Eval and Management.  Also called and notified Dr. Mariana Kaufman nurse, [place].

## 2015-10-22 NOTE — Telephone Encounter (Signed)
S/w pt per Dr Edwyna Shell attached message.

## 2015-10-22 NOTE — Telephone Encounter (Signed)
-----   Message from Gordy Levan, MD sent at 10/21/2015  2:52 PM EDT ----- Please send to pharmacy, generics always fine  Compazine 10 mg   1 every 12 hrs prn nausea #30 1 RF Protonix 40 mg    1 daily for indigestion  #30  1 RF

## 2015-10-22 NOTE — Progress Notes (Signed)
Patient ID: Lindsey Hughes, female   DOB: March 16, 1971, 45 y.o.   MRN: [id number] Patient presented to IR department today for further evaluation of right chest wall Port-A-Cath. Port-A-Cath was placed on 10/11/15 secondary to need for chemotherapy for cervical cancer. Patient states that approximately 2 days ago she began having some swelling at the right IJ access site. It has remained slightly tender since that time. She denies any fever or shortness of breath. She does have occasional nonproductive cough and intermittent nausea. The Port-A-Cath has been utilized and functions without difficulty. The patient has no associated right upper extremity or facial swelling. There is some mild soft tissue swelling at the IJ puncture site. She was examined by Dr. Anselm Pancoast and ultrasound of the region showed no acute problems at this time. The Port-A-Cath site is clean and dry and without significant erythema. Recommend continued monitoring of site and patient will return to IR department on Monday 7/3  if problem persists.

## 2015-10-22 NOTE — Telephone Encounter (Signed)
Pt called stating rx not at pharmacy. Rx were sent to wrong pharmacy, correction made and pt informed.

## 2015-10-25 ENCOUNTER — Ambulatory Visit (HOSPITAL_COMMUNITY)
Admission: RE | Admit: 2015-10-25 | Discharge: 2015-10-25 | Disposition: A | Discharge status: Home / Self Care | Payer: BLUE CROSS/BLUE SHIELD | Source: Ambulatory Visit | Attending: Radiology | Admitting: Radiology

## 2015-10-25 ENCOUNTER — Telehealth: Payer: Self-pay | Admitting: Oncology

## 2015-10-25 ENCOUNTER — Other Ambulatory Visit: Payer: Self-pay | Admitting: Oncology

## 2015-10-25 ENCOUNTER — Telehealth: Payer: Self-pay

## 2015-10-25 ENCOUNTER — Ambulatory Visit (HOSPITAL_BASED_OUTPATIENT_CLINIC_OR_DEPARTMENT_OTHER)
Admission: RE | Admit: 2015-10-25 | Discharge: 2015-10-25 | Disposition: A | Discharge status: Home / Self Care | Payer: BLUE CROSS/BLUE SHIELD | Source: Ambulatory Visit | Attending: Interventional Radiology | Admitting: Interventional Radiology

## 2015-10-25 ENCOUNTER — Ambulatory Visit (HOSPITAL_BASED_OUTPATIENT_CLINIC_OR_DEPARTMENT_OTHER): Payer: BLUE CROSS/BLUE SHIELD

## 2015-10-25 ENCOUNTER — Other Ambulatory Visit (HOSPITAL_COMMUNITY): Payer: Self-pay | Admitting: Radiology

## 2015-10-25 ENCOUNTER — Other Ambulatory Visit (HOSPITAL_COMMUNITY): Payer: Self-pay | Admitting: Interventional Radiology

## 2015-10-25 ENCOUNTER — Ambulatory Visit
Admission: RE | Admit: 2015-10-25 | Discharge: 2015-10-25 | Disposition: A | Discharge status: Home / Self Care | Payer: BLUE CROSS/BLUE SHIELD | Source: Ambulatory Visit | Attending: Radiation Oncology | Admitting: Radiation Oncology

## 2015-10-25 ENCOUNTER — Ambulatory Visit: Payer: BLUE CROSS/BLUE SHIELD | Admitting: Oncology

## 2015-10-25 ENCOUNTER — Ambulatory Visit: Payer: BLUE CROSS/BLUE SHIELD

## 2015-10-25 VITALS — BP 135/78 | HR 88 | Temp 98.7°F | Resp 18

## 2015-10-25 DIAGNOSIS — Z5111 Encounter for antineoplastic chemotherapy: Secondary | ICD-10-CM

## 2015-10-25 DIAGNOSIS — Z95828 Presence of other vascular implants and grafts: Secondary | ICD-10-CM

## 2015-10-25 DIAGNOSIS — C531 Malignant neoplasm of exocervix: Secondary | ICD-10-CM

## 2015-10-25 DIAGNOSIS — C530 Malignant neoplasm of endocervix: Secondary | ICD-10-CM

## 2015-10-25 DIAGNOSIS — M79601 Pain in right arm: Secondary | ICD-10-CM | POA: Insufficient documentation

## 2015-10-25 DIAGNOSIS — R600 Localized edema: Secondary | ICD-10-CM | POA: Insufficient documentation

## 2015-10-25 DIAGNOSIS — R300 Dysuria: Secondary | ICD-10-CM

## 2015-10-25 DIAGNOSIS — Z510 Encounter for antineoplastic radiation therapy: Secondary | ICD-10-CM | POA: Diagnosis not present

## 2015-10-25 HISTORY — PX: IR GENERIC HISTORICAL: IMG1180011

## 2015-10-25 LAB — URINALYSIS, MICROSCOPIC - CHCC
Bilirubin (Urine): NEGATIVE
GLUCOSE UR CHCC: NEGATIVE mg/dL
Ketones: NEGATIVE mg/dL
NITRITE: NEGATIVE
PH: 6 (ref 4.6–8.0)
Specific Gravity, Urine: 1.03 (ref 1.003–1.035)
UROBILINOGEN UR: 0.2 mg/dL (ref 0.2–1)

## 2015-10-25 LAB — COMPREHENSIVE METABOLIC PANEL
ALT: 34 U/L (ref 0–55)
ANION GAP: 11 meq/L (ref 3–11)
AST: 18 U/L (ref 5–34)
Albumin: 3.8 g/dL (ref 3.5–5.0)
Alkaline Phosphatase: 75 U/L (ref 40–150)
BUN: 15.4 mg/dL (ref 7.0–26.0)
CHLORIDE: 103 meq/L (ref 98–109)
CO2: 26 meq/L (ref 22–29)
CREATININE: 1.1 mg/dL (ref 0.6–1.1)
Calcium: 9.4 mg/dL (ref 8.4–10.4)
EGFR: 61 mL/min/{1.73_m2} — AB (ref >90)
Glucose: 101 mg/dl (ref 70–140)
POTASSIUM: 3.6 meq/L (ref 3.5–5.1)
Sodium: 140 mEq/L (ref 136–145)
Total Bilirubin: <0.3 mg/dL (ref 0.20–1.20)
Total Protein: 7.4 g/dL (ref 6.4–8.3)

## 2015-10-25 LAB — CBC WITH DIFFERENTIAL/PLATELET
BASO%: 0.9 % (ref 0.0–2.0)
Basophils Absolute: 0 10*3/uL (ref 0.0–0.1)
EOS%: 2.9 % (ref 0.0–7.0)
Eosinophils Absolute: 0.1 10*3/uL (ref 0.0–0.5)
HCT: 36.5 % (ref 34.8–46.6)
HGB: 12 g/dL (ref 11.6–15.9)
LYMPH%: 9.9 % — AB (ref 14.0–49.7)
MCH: 28.2 pg (ref 25.1–34.0)
MCHC: 33 g/dL (ref 31.5–36.0)
MCV: 85.5 fL (ref 79.5–101.0)
MONO#: 0.4 10*3/uL (ref 0.1–0.9)
MONO%: 8.6 % (ref 0.0–14.0)
NEUT#: 3.5 10*3/uL (ref 1.5–6.5)
NEUT%: 77.7 % — AB (ref 38.4–76.8)
PLATELETS: 198 10*3/uL (ref 145–400)
RBC: 4.26 10*6/uL (ref 3.70–5.45)
RDW: 14.2 % (ref 11.2–14.5)
WBC: 4.5 10*3/uL (ref 3.9–10.3)
lymph#: 0.4 10*3/uL — ABNORMAL LOW (ref 0.9–3.3)

## 2015-10-25 LAB — MAGNESIUM: Magnesium: 2.2 mg/dl (ref 1.5–2.5)

## 2015-10-25 MED ORDER — SODIUM CHLORIDE 0.9 % IV SOLN
Freq: Once | INTRAVENOUS | Status: AC
  Administered 2015-10-25: 15:00:00 via INTRAVENOUS

## 2015-10-25 MED ORDER — ONDANSETRON HCL 40 MG/20ML IJ SOLN
Freq: Once | INTRAMUSCULAR | Status: AC
  Administered 2015-10-25: 15:00:00 via INTRAVENOUS
  Filled 2015-10-25: qty 4

## 2015-10-25 MED ORDER — POTASSIUM CHLORIDE 2 MEQ/ML IV SOLN
Freq: Once | INTRAVENOUS | Status: AC
  Administered 2015-10-25: 13:00:00 via INTRAVENOUS
  Filled 2015-10-25: qty 10

## 2015-10-25 MED ORDER — LORAZEPAM 2 MG/ML IJ SOLN
1.0000 mg | Freq: Once | INTRAMUSCULAR | Status: AC | PRN
  Administered 2015-10-25: 1 mg via INTRAVENOUS

## 2015-10-25 MED ORDER — LORAZEPAM 2 MG/ML IJ SOLN
INTRAMUSCULAR | Status: AC
  Filled 2015-10-25: qty 1

## 2015-10-25 MED ORDER — SODIUM CHLORIDE 0.9% FLUSH
10.0000 mL | INTRAVENOUS | Status: DC | PRN
  Filled 2015-10-25: qty 10

## 2015-10-25 MED ORDER — SODIUM CHLORIDE 0.9% FLUSH
10.0000 mL | INTRAVENOUS | Status: DC | PRN
  Administered 2015-10-25: 10 mL
  Filled 2015-10-25: qty 10

## 2015-10-25 MED ORDER — SODIUM CHLORIDE 0.9 % IV SOLN
Freq: Once | INTRAVENOUS | Status: AC
  Administered 2015-10-25: 15:00:00 via INTRAVENOUS
  Filled 2015-10-25: qty 5

## 2015-10-25 MED ORDER — HEPARIN SOD (PORK) LOCK FLUSH 100 UNIT/ML IV SOLN
500.0000 [IU] | Freq: Once | INTRAVENOUS | Status: AC | PRN
  Administered 2015-10-25: 500 [IU]
  Filled 2015-10-25: qty 5

## 2015-10-25 MED ORDER — SODIUM CHLORIDE 0.9 % IV SOLN
40.0000 mg/m2 | Freq: Once | INTRAVENOUS | Status: AC
  Administered 2015-10-25: 81 mg via INTRAVENOUS
  Filled 2015-10-25: qty 81

## 2015-10-25 NOTE — Progress Notes (Signed)
Swelling noted to right side of Patient's neck. Patient agreed to have all labs drawn by the phlebotomist today. Ann Lions, RN Clinical Supervisor in to assess Patient.

## 2015-10-25 NOTE — Patient Instructions (Addendum)
[facility] Discharge Instructions for Patients Receiving Chemotherapy  Today you received the following chemotherapy agents:  Cisplatin  To help prevent nausea and vomiting after your treatment, we encourage you to take your nausea medication as prescribed.   If you develop nausea and vomiting that is not controlled by your nausea medication, call the clinic.   BELOW ARE SYMPTOMS THAT SHOULD BE REPORTED IMMEDIATELY:  *FEVER GREATER THAN 100.5 F  *CHILLS WITH OR WITHOUT FEVER  NAUSEA AND VOMITING THAT IS NOT CONTROLLED WITH YOUR NAUSEA MEDICATION  *UNUSUAL SHORTNESS OF BREATH  *UNUSUAL BRUISING OR BLEEDING  TENDERNESS IN MOUTH AND THROAT WITH OR WITHOUT PRESENCE OF ULCERS  *URINARY PROBLEMS  *BOWEL PROBLEMS  UNUSUAL RASH Items with * indicate a potential emergency and should be followed up as soon as possible.  Feel free to call the clinic you have any questions or concerns. The clinic phone number is [phone].  Please show the [facility] at check-in to the Emergency Department and triage nurse.   FOR RIGHT ARM: Warm compresses to right arm every 30 minutes while awake.  Will instruct her to put warm soaks to area every 30 min while awake today, ok by labs to take ibuprofen or aleve bid with food and fluids today and 7-4. If increased erythema or fever would begin oral keflex.

## 2015-10-25 NOTE — Progress Notes (Signed)
Interventional Radiology Progress Note  Ms Lindsey Hughes is a 45 yo female presenting as suggested by our [place] service after a visit on Friday June 30.    She is SP right IJ port placement 10/11/2015.  The port has been operating just fine.   She noticed some neck swelling on Wednesday June 28th, which prompted her visit on Friday.  My partner Dr. Anselm Pancoast evaluated her, with no evidence of infection or inflammation.    She returns today with right elbow pain/'fever'.  She denies any SOB or DOE.  Denies chest pain.  Denies any fever, rigors, chills.   The site is healed nicely.  No ballotable fluid at the right neck.    RUE duplex was ordered today to rule out catheter associated thrombus.  It is negative for DVT, but there is a short segment of right basilic vein superficial thrombus at the elbow, near the site of her symptoms. No evidence of catheter associated thrombus.   I discussed conservative measures to ease her symptoms, such as warm/cool compress of the upper extremity, and OTC analgesics.  I discussed the findings and my exam with Dr. Marko Plume.  She agrees with conservative approach.  Ms Strauch will be following up with her oncology office, and agrees with our plan of care.    Signed,  Dulcy Fanny. Earleen Newport, DO

## 2015-10-25 NOTE — Progress Notes (Signed)
VASCULAR LAB PRELIMINARY  PRELIMINARY  PRELIMINARY  PRELIMINARY  Right upper extremity venous duplex completed.    Preliminary report:  No evidence of deep vein thrombosis involving the right upper extremity. Positive for a superficial thrombombus of the basilic vein coursing from just below the [facility] to the confluence with the brachial vein but not including.  Tarius Stangelo, [place], RVS 10/25/2015, 12:04 PM

## 2015-10-25 NOTE — Telephone Encounter (Signed)
Pt cc of urinary burning frequency.  Sent a urine for u/a and C&S.

## 2015-10-25 NOTE — Telephone Encounter (Signed)
Lindsey Hughes seen in  Lobby this am.  She states that the left medial aspect of the right elbow is sore and felt as though it was warm to touch over the weekend.  The right IJ puncture site remains swollen. Discussed with Lindaann Pascal.  Pt to go to [facility] IR to have PAC  evaluated.

## 2015-10-25 NOTE — Telephone Encounter (Signed)
Medical Oncology  Radiologist phoned to discuss evaluation there now for swelling, tenderness at right elbow. Previous US right neck in past week not remarkable. [place] now shows superficial thrombophlebitis at basilic vein, not in deep venous system. Note she had initial IV access for chemo RUE.  Patient coming back to [facility], ok for chemo if time allows today otherwise will need to reschedule. Will instruct her to put warm soaks to area every 30 min while awake today, ok by labs to take ibuprofen or aleve bid with food and fluids today and 7-4. If increased erythema or fever would begin oral keflex.   Godfrey Pick, MD

## 2015-10-27 ENCOUNTER — Telehealth: Payer: Self-pay

## 2015-10-27 ENCOUNTER — Ambulatory Visit
Admission: RE | Admit: 2015-10-27 | Discharge: 2015-10-27 | Disposition: A | Discharge status: Home / Self Care | Payer: BLUE CROSS/BLUE SHIELD | Source: Ambulatory Visit | Attending: Radiation Oncology | Admitting: Radiation Oncology

## 2015-10-27 ENCOUNTER — Other Ambulatory Visit: Payer: Self-pay | Admitting: Oncology

## 2015-10-27 DIAGNOSIS — Z510 Encounter for antineoplastic radiation therapy: Secondary | ICD-10-CM | POA: Diagnosis not present

## 2015-10-27 LAB — URINE CULTURE

## 2015-10-27 MED ORDER — AMOXICILLIN 250 MG PO CAPS: 250.0000 mg | ORAL_CAPSULE | Freq: Three times a day (TID) | ORAL | Status: DC

## 2015-10-27 NOTE — Telephone Encounter (Signed)
-----   Message from Gordy Levan, MD sent at 10/27/2015 10:09 AM EDT ----- Labs seen and need follow up: still <100k, so if not symptomatic now ok to follow. If she is still having symptoms, ok for amoxicillin 250 mg q 8 hrs x 3 days.  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   thanks

## 2015-10-27 NOTE — Telephone Encounter (Signed)
-----   Message from Gordy Levan, MD sent at 10/27/2015 11:49 AM EDT ----- Juliann Pulse please see my phone note 7-3. I cannot tell if this was communicated to patient by our office    thanks

## 2015-10-27 NOTE — Telephone Encounter (Signed)
S/w pt. 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 She still feels the urgency to urinate, very little urine comes out though, not painful as urine flows. Rx sent per Dr LL attached message.

## 2015-10-27 NOTE — Telephone Encounter (Signed)
S/w pt about her arm. Lindsey Hughes had talked with her Monday. It is about the same, maybe a little better. Denied increased redness nor heat. Discussed warm soaks for another day. Pt also started on amoxicillin for possible UTI today and this could possibly improve the arm as well.

## 2015-10-28 ENCOUNTER — Encounter: Payer: Self-pay | Admitting: Oncology

## 2015-10-28 ENCOUNTER — Ambulatory Visit (HOSPITAL_BASED_OUTPATIENT_CLINIC_OR_DEPARTMENT_OTHER): Payer: BLUE CROSS/BLUE SHIELD | Admitting: Oncology

## 2015-10-28 ENCOUNTER — Ambulatory Visit
Admission: RE | Admit: 2015-10-28 | Discharge: 2015-10-28 | Disposition: A | Discharge status: Home / Self Care | Payer: BLUE CROSS/BLUE SHIELD | Source: Ambulatory Visit | Attending: Radiation Oncology | Admitting: Radiation Oncology

## 2015-10-28 VITALS — BP 119/74 | HR 80 | Temp 98.1°F | Resp 18 | Ht 62.0 in | Wt 206.4 lb

## 2015-10-28 DIAGNOSIS — R112 Nausea with vomiting, unspecified: Secondary | ICD-10-CM

## 2015-10-28 DIAGNOSIS — Z510 Encounter for antineoplastic radiation therapy: Secondary | ICD-10-CM | POA: Diagnosis not present

## 2015-10-28 DIAGNOSIS — T451X5A Adverse effect of antineoplastic and immunosuppressive drugs, initial encounter: Secondary | ICD-10-CM

## 2015-10-28 DIAGNOSIS — I808 Phlebitis and thrombophlebitis of other sites: Secondary | ICD-10-CM

## 2015-10-28 DIAGNOSIS — C530 Malignant neoplasm of endocervix: Secondary | ICD-10-CM

## 2015-10-28 DIAGNOSIS — Z95828 Presence of other vascular implants and grafts: Secondary | ICD-10-CM

## 2015-10-28 DIAGNOSIS — E039 Hypothyroidism, unspecified: Secondary | ICD-10-CM

## 2015-10-28 DIAGNOSIS — C531 Malignant neoplasm of exocervix: Secondary | ICD-10-CM

## 2015-10-28 NOTE — Progress Notes (Signed)
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 She had cycle 4 CDDP on 10-25-15; external beam radiation is to complete on 11-15-15 per schedule in EMR now. She will have HDR after external beam completes, CDDP only with external beam. Patient tells me that one radiation treatment will need to be rescheduled, as she has to accompany 49 yo stepdaughter to renal transplant consultation at [facility].   Patient had reported dysuria on 10-25-15, urine culture with 25 - 50k B strep. With reported symptoms she was prescribed amoxicillin, which she did not start. She denies any symptoms of UTI now. She had evaluation of PAC and [place] LUE by IR on 10-25-15, with no abnormalities identified with PAC but superficial thrombophlebitis at right antecubital. She has used warm soaks with resolution of the arm symptoms. NOTE first chemo was given by peripheral vein LUE, very difficult access then.  She seems to have had HA with zofran, nausea much better now with prn compazine and daily protonix begun 10-22-15.   Patient is feeling much better overall today. Appetite is better with resolution of nausea, bowels moving several times daily but without diarrhea (close to baseline), voiding without difficulty. She has had no bleeding in over a week and no back pain now. She has had no fever. She slept some last pm, no SOB, no new or different pain, no LE swelling. No peripheral neuropathy. Remainder of 10 point Review of Systems negative.      ONCOLOGIC HISTORY Patient has history of abnormal PAPs ~ 5-7 years ago, not followed up. She presented to PCP with menorrhagia x 3-4 months, which progressed to continuous  bleeding, Evaluation by Dr Pamala Hurry was complicated by cervical stenosis, and bleeding did not improve with hormonal intervention. [place] 07-15-15 showed normal right ovary, probably normal left ovary and uterine fibroid. Endometrial biopsy was accomplished on 08-12-15, pathology "weakly proliferative endometrium with glandular and stromal breakdown and abundant blood, no endometrial hyperplasia identified". She was taken for planned robotic hysterectomy by Dr Pamala Hurry on 09-03-15, however exam under anesthesia revealed abnormalities in posterior lip of cervix and hysterectomy not done. Biopsy ([id number] invasive poorly differentiated squamous cell carcinoma with involvement of endocervix and focal vascular invasion. 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 She was seen in consultation by Dr Denman George on 09-16-15, with posterior lip of cervix replaced by 6 cm tumor, with tumor beginning to infiltrate into bilateral parametria. Recommendation is for radiation with sensitizing chemotherapy. PET 09-17-15 showed intensely hypermetabolic cervical mass 5.2 x 3.3 cm (SUV 42), mild hypermetabolic uptake thru endometrium (SUV 6.2) and in high left adnexa without CT correlate, bilateral external iliac nodes 0.8 - 1.1 cm with low level uptake.Recommendation is for sensitizing chemotherapy with radiation. First weekly CDDP given 10-04-15  Objective:  Vital signs in last 24 hours:  BP 119/74 mmHg  Pulse 80  Temp(Src) 98.1 F (36.7 C) (Oral)  Resp 18  Ht 5' 2"  (1.575 m)  Wt 206 lb 6.4 oz (93.622 kg)  BMI 37.74 kg/m2  SpO2 100% Weight down 4 lbs Alert, oriented and appropriate. Ambulatory without difficulty. Looks comfortable and more relaxed today. No alopecia  HEENT:PERRL, sclerae not icteric. Oral mucosa moist without lesions, posterior pharynx clear.  Neck supple. No JVD.  Lymphatics:no cervical,supraclavicular, axillary or inguinal adenopathy Resp: clear to auscultation bilaterally and normal percussion  bilaterally Cardio: regular rate and rhythm. No gallop. GI: abdomen obese, soft, nontender, not distended, no mass or organomegaly appreciated. Normally active bowel sounds. Surgical incision not remarkable. Musculoskeletal/ Extremities: without pitting edema, cords, tenderness. LUE area of antecubital laterally no heat ,swelling, cords, tenderness Neuro: no peripheral neuropathy. Otherwise nonfocal. PSYCH appropriate mood and affedt Skin without rash, ecchymosis, petechiae Portacath-without erythema or tenderness. I cannot appreciate swelling right supraclav compared with left supraclav  Lab Results:  Results for orders placed or performed in visit on 10/25/15  Urine Culture  Result Value Ref Range   Urine Culture, Routine Final report (A)    Urine Culture result 1 Comment (A)   CBC with Differential  Result Value Ref Range   WBC 4.5 3.9 - 10.3 10e3/uL   NEUT# 3.5 1.5 - 6.5 10e3/uL   HGB 12.0 11.6 - 15.9 g/dL   HCT 36.5 34.8 - 46.6 %   Platelets 198 145 - 400 10e3/uL   MCV 85.5 79.5 - 101.0 fL   MCH 28.2 25.1 - 34.0 pg   MCHC 33.0 31.5 - 36.0 g/dL   RBC 4.26 3.70 - 5.45 10e6/uL   RDW 14.2 11.2 - 14.5 %   lymph# 0.4 (L) 0.9 - 3.3 10e3/uL   MONO# 0.4 0.1 - 0.9 10e3/uL   Eosinophils Absolute 0.1 0.0 - 0.5 10e3/uL   Basophils Absolute 0.0 0.0 - 0.1 10e3/uL   NEUT% 77.7 (H) 38.4 - 76.8 %   LYMPH% 9.9 (L) 14.0 - 49.7 %   MONO% 8.6 0.0 - 14.0 %   EOS% 2.9 0.0 - 7.0 %   BASO% 0.9 0.0 - 2.0 %  Comprehensive metabolic panel  Result Value Ref Range   Sodium 140 136 - 145 mEq/L   Potassium 3.6 3.5 - 5.1 mEq/L   Chloride 103 98 - 109 mEq/L   CO2 26 22 - 29 mEq/L   Glucose 101 70 - 140 mg/dl   BUN 15.4 7.0 - 26.0 mg/dL   Creatinine 1.1 0.6 - 1.1 mg/dL   Total Bilirubin <0.30 0.20 - 1.20 mg/dL   Alkaline Phosphatase 75 40 - 150 U/L   AST 18 5 - 34 U/L   ALT 34 0 - 55 U/L   Total Protein 7.4 6.4 - 8.3 g/dL   Albumin 3.8 3.5 - 5.0 g/dL   Calcium 9.4 8.4 - 10.4 mg/dL   Anion Gap 11  3 - 11 mEq/L   EGFR 61 (L) >90 ml/min/1.73 m2  Magnesium  Result Value Ref Range   Magnesium 2.2 1.5 - 2.5 mg/dl  Urinalysis with microscopic  Result Value Ref Range   Glucose Negative Negative mg/dL   Bilirubin (Urine) Negative Negative   Ketones Negative Negative mg/dL   Specific Gravity, Urine 1.030 1.003 - 1.035   Blood Trace Negative   pH 6.0 4.6 - 8.0   Protein < 30 Negative- <30 mg/dL   Urobilinogen, UR 0.2 0.2 - 1 mg/dL   Nitrite Negative Negative   Leukocyte Esterase Small Negative   RBC / HPF 3-6 0 - 2   WBC, UA 11-20 0 - 2   Bacteria, UA Moderate Negative- Trace   Epithelial Cells Moderate Negative- Few   Mucus, UA Moderate Negative- Small     Studies/Results:  No results found.  Medications: I have reviewed the patient's current medications. Continue prn compazine for nausea and daily protonix for now. Hold zofran due to HA  DISCUSSION Interval history reviewed, including PAC evaluation, LUE superficial thrombophlebitis, urinary symptoms. Patient is in agreement with continuing chemo as planned thru cycle 6 on 11-08-15.   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 Weekly sensitizing CDDP with radiation started 10-04-15. Doing better with adjustments in medications, continue CDDP with cycle 5 on 11-01-15 and cycle 6 on 11-08-15. .  2.hypothyroid: she should resume medication as instructed by PCP: PET shows some uptake in left lobe thyroid without discreeet nodule on CT imaging. Will need thyroid US at least after completion of this treatment if not already done by PCP (per patient's history, probably has not had thyroid US) 3.post bilateral carpal tunnel surgery and BTL 4.social stress with 42 yo step daughter recently diagnosed with renal failure, apparently congenital renal problems not previously known. On wait list for renal transplant. In and out of  [facility] support staff aware 5.multiple bowel movements daily as baseline. No c diff, so ok to use imodium per rad onc 6.vaginal bleeding has stopped, likely showing treatment benefit : Can skip days with oral iron if needed due to GI symptoms 7.PAC in 8.< 100K B strep on last urine culture, no symptoms now, follow 9.chemo nausea and GERD: much better with compazine and protonix 10.superficial thrombophlebitis RUE where IV access was difficult for first peripheral chemo. Symptoms resolved with local measures.   All questions answered. Chemo orders confirmed. Time spent 25 min including >50% counseling and coordination of care.    LIVESAY,LENNIS P, MD   10/28/2015, 12:25 PM

## 2015-10-29 ENCOUNTER — Other Ambulatory Visit: Payer: Self-pay | Admitting: Oncology

## 2015-10-29 ENCOUNTER — Ambulatory Visit
Admission: RE | Admit: 2015-10-29 | Discharge: 2015-10-29 | Disposition: A | Discharge status: Home / Self Care | Payer: BLUE CROSS/BLUE SHIELD | Source: Ambulatory Visit | Attending: Radiation Oncology | Admitting: Radiation Oncology

## 2015-10-29 ENCOUNTER — Encounter: Payer: Self-pay | Admitting: Radiation Oncology

## 2015-10-29 VITALS — BP 126/74 | HR 95 | Temp 98.2°F | Ht 62.0 in | Wt 208.2 lb

## 2015-10-29 DIAGNOSIS — C531 Malignant neoplasm of exocervix: Secondary | ICD-10-CM

## 2015-10-29 DIAGNOSIS — Z510 Encounter for antineoplastic radiation therapy: Secondary | ICD-10-CM | POA: Diagnosis not present

## 2015-10-29 NOTE — Progress Notes (Signed)
  Radiation Oncology         [phone] ________________________________  Name: Lindsey Hughes MRN: [id number]  Date: 10/29/2015  DOB: 10-22-1970  Weekly Radiation Therapy Management  Malignant neoplasm of exocervix    ICD-9-CM ICD-10-CM   1. Malignant neoplasm of exocervix (HCC) 180.1 C53.1      Current Dose: 34.2 Gy     Planned Dose:  54+ Gy  Narrative .The patient presents for routine under treatment assessment. CBCT/MVCT images/Port film x-rays were reviewed.  The chart was checked.  Lindsey Hughes has completed 19 fractions to her pelvis. She denies having pain. She reports having some nausea and is taking compazine and Protonix which is helping. She reports her diarrhea is improving although she is still having 3 loose stools per day. She has not been taking Imodium. She reports having urinary frequency. She denies having dysuria or hematuria. She denies having vaginal/rectal bleeding or skin irritation. She reports her energy level is good. She had chemotherapy on Monday. Her right port a cath remains slightly swollen and was evaluated by IR. She also mentioned that she has a blood clot in her right arm that is being monitored. She does not have to take blood thinners. Pt reports that Med/onc MD is aware.  Patient reports that she does not have a decreased appetite. Denies skin irritation over pelvis                                    Set-up films were reviewed.                                 The chart was checked. Physical Findings. . Danley Danker Vitals:   10/29/15 0811  BP: 126/74  Pulse: 95  Temp: 98.2 F (36.8 C)   Wt Readings from Last 3 Encounters:  10/29/15 208 lb 3.2 oz (94.439 kg)  10/28/15 206 lb 6.4 oz (93.622 kg)  10/21/15 210 lb 8 oz (95.482 kg)   Patient reports mild diffuse tenderness to palpation in the upper abdomen. Ambulatory and non toxic appearing.  Impression . . . . . . . The patient is having side effects with treatment as above.  Plan . . . . . . . . .  . . . Continue treatment as planned.    -----------------------------------  Eppie Gibson, MD   This document serves as a record of services personally performed by Eppie Gibson , MD. It was created on his behalf by Truddie Hidden, a trained medical scribe. The creation of this record is based on the scribe's personal observations and the provider's statements to them. This document has been checked and approved by the attending provider.

## 2015-10-29 NOTE — Progress Notes (Addendum)
Lindsey Hughes has completed 19 fractions to her pelvis.  She denies having pain.  She reports having some nausea and is taking compazine and Protonix which is helping.  She reports her diarrhea is improving although she is still having 3 loose stools per day.  She has not been taking Imodium.  She reports having urinary frequency.  She denies having dysuria or hematuria.  She denies having vaginal/rectal bleeding or skin irritation.  She reports her energy level is good.  She had chemotherapy on Monday.  Her right port a cath remains slightly swollen and was evaluated by IR. She also mentioned that she has a blood clot in her left arm that is being monitored.  She does not have to take blood thinners.  BP 126/74 mmHg  Pulse 95  Temp(Src) 98.2 F (36.8 C) (Oral)  Ht 5\' 2"  (1.575 m)  Wt 208 lb 3.2 oz (94.439 kg)  BMI 38.07 kg/m2  SpO2 100%   Wt Readings from Last 3 Encounters:  10/29/15 208 lb 3.2 oz (94.439 kg)  10/28/15 206 lb 6.4 oz (93.622 kg)  10/21/15 210 lb 8 oz (95.482 kg)

## 2015-10-31 DIAGNOSIS — I808 Phlebitis and thrombophlebitis of other sites: Secondary | ICD-10-CM | POA: Insufficient documentation

## 2015-10-31 DIAGNOSIS — Z95828 Presence of other vascular implants and grafts: Secondary | ICD-10-CM | POA: Insufficient documentation

## 2015-11-01 ENCOUNTER — Ambulatory Visit
Admission: RE | Admit: 2015-11-01 | Discharge: 2015-11-01 | Disposition: A | Discharge status: Home / Self Care | Payer: BLUE CROSS/BLUE SHIELD | Source: Ambulatory Visit | Attending: Radiation Oncology | Admitting: Radiation Oncology

## 2015-11-01 ENCOUNTER — Ambulatory Visit: Payer: BLUE CROSS/BLUE SHIELD | Admitting: Oncology

## 2015-11-01 ENCOUNTER — Ambulatory Visit (HOSPITAL_BASED_OUTPATIENT_CLINIC_OR_DEPARTMENT_OTHER): Payer: BLUE CROSS/BLUE SHIELD

## 2015-11-01 ENCOUNTER — Other Ambulatory Visit: Payer: Self-pay | Admitting: Oncology

## 2015-11-01 VITALS — BP 130/86 | HR 77 | Temp 98.0°F | Resp 18

## 2015-11-01 DIAGNOSIS — Z5111 Encounter for antineoplastic chemotherapy: Secondary | ICD-10-CM

## 2015-11-01 DIAGNOSIS — C530 Malignant neoplasm of endocervix: Secondary | ICD-10-CM | POA: Diagnosis not present

## 2015-11-01 DIAGNOSIS — Z510 Encounter for antineoplastic radiation therapy: Secondary | ICD-10-CM | POA: Diagnosis not present

## 2015-11-01 DIAGNOSIS — C531 Malignant neoplasm of exocervix: Secondary | ICD-10-CM

## 2015-11-01 LAB — CBC WITH DIFFERENTIAL/PLATELET
BASO%: 0.8 % (ref 0.0–2.0)
BASOS ABS: 0 10*3/uL (ref 0.0–0.1)
EOS%: 1.2 % (ref 0.0–7.0)
Eosinophils Absolute: 0 10*3/uL (ref 0.0–0.5)
HEMATOCRIT: 33.1 % — AB (ref 34.8–46.6)
HEMOGLOBIN: 11.1 g/dL — AB (ref 11.6–15.9)
LYMPH#: 0.3 10*3/uL — AB (ref 0.9–3.3)
LYMPH%: 11.5 % — ABNORMAL LOW (ref 14.0–49.7)
MCH: 28.5 pg (ref 25.1–34.0)
MCHC: 33.4 g/dL (ref 31.5–36.0)
MCV: 85.6 fL (ref 79.5–101.0)
MONO#: 0.3 10*3/uL (ref 0.1–0.9)
MONO%: 10.7 % (ref 0.0–14.0)
NEUT%: 75.8 % (ref 38.4–76.8)
NEUTROS ABS: 2.3 10*3/uL (ref 1.5–6.5)
Platelets: 146 10*3/uL (ref 145–400)
RBC: 3.87 10*6/uL (ref 3.70–5.45)
RDW: 14.2 % (ref 11.2–14.5)
WBC: 3 10*3/uL — AB (ref 3.9–10.3)

## 2015-11-01 LAB — COMPREHENSIVE METABOLIC PANEL
ALBUMIN: 3.5 g/dL (ref 3.5–5.0)
ALK PHOS: 64 U/L (ref 40–150)
ALT: 25 U/L (ref 0–55)
AST: 17 U/L (ref 5–34)
Anion Gap: 10 mEq/L (ref 3–11)
BUN: 15.1 mg/dL (ref 7.0–26.0)
CALCIUM: 9.3 mg/dL (ref 8.4–10.4)
CO2: 25 mEq/L (ref 22–29)
CREATININE: 0.8 mg/dL (ref 0.6–1.1)
Chloride: 105 mEq/L (ref 98–109)
EGFR: 86 mL/min/{1.73_m2} — ABNORMAL LOW (ref >90)
GLUCOSE: 103 mg/dL (ref 70–140)
Potassium: 4.8 mEq/L (ref 3.5–5.1)
SODIUM: 140 meq/L (ref 136–145)
TOTAL PROTEIN: 6.8 g/dL (ref 6.4–8.3)
Total Bilirubin: 0.31 mg/dL (ref 0.20–1.20)

## 2015-11-01 LAB — MAGNESIUM: MAGNESIUM: 1.7 mg/dL (ref 1.5–2.5)

## 2015-11-01 MED ORDER — HEPARIN SOD (PORK) LOCK FLUSH 100 UNIT/ML IV SOLN
500.0000 [IU] | Freq: Once | INTRAVENOUS | Status: AC | PRN
  Administered 2015-11-01: 500 [IU]
  Filled 2015-11-01: qty 5

## 2015-11-01 MED ORDER — SODIUM CHLORIDE 0.9% FLUSH
10.0000 mL | INTRAVENOUS | Status: DC | PRN
  Administered 2015-11-01: 10 mL
  Filled 2015-11-01: qty 10

## 2015-11-01 MED ORDER — LORAZEPAM 2 MG/ML IJ SOLN
1.0000 mg | Freq: Once | INTRAMUSCULAR | Status: AC | PRN
  Administered 2015-11-01: 1 mg via INTRAVENOUS

## 2015-11-01 MED ORDER — POTASSIUM CHLORIDE 2 MEQ/ML IV SOLN
Freq: Once | INTRAVENOUS | Status: AC
  Administered 2015-11-01: 09:00:00 via INTRAVENOUS
  Filled 2015-11-01: qty 10

## 2015-11-01 MED ORDER — PROCHLORPERAZINE EDISYLATE 5 MG/ML IJ SOLN
10.0000 mg | Freq: Once | INTRAMUSCULAR | Status: AC | PRN
  Administered 2015-11-01: 10 mg via INTRAVENOUS

## 2015-11-01 MED ORDER — SODIUM CHLORIDE 0.9 % IV SOLN
Freq: Once | INTRAVENOUS | Status: AC
  Administered 2015-11-01: 11:00:00 via INTRAVENOUS
  Filled 2015-11-01: qty 5

## 2015-11-01 MED ORDER — SODIUM CHLORIDE 0.9 % IV SOLN
Freq: Once | INTRAVENOUS | Status: AC
  Administered 2015-11-01: 09:00:00 via INTRAVENOUS

## 2015-11-01 MED ORDER — SODIUM CHLORIDE 0.9 % IV SOLN
40.0000 mg/m2 | Freq: Once | INTRAVENOUS | Status: AC
  Administered 2015-11-01: 81 mg via INTRAVENOUS
  Filled 2015-11-01: qty 81

## 2015-11-01 MED ORDER — LORAZEPAM 2 MG/ML IJ SOLN
INTRAMUSCULAR | Status: AC
  Filled 2015-11-01: qty 1

## 2015-11-01 MED ORDER — PROCHLORPERAZINE EDISYLATE 5 MG/ML IJ SOLN
INTRAMUSCULAR | Status: AC
  Filled 2015-11-01: qty 2

## 2015-11-01 NOTE — Patient Instructions (Signed)
[facility] Discharge Instructions for Patients Receiving Chemotherapy  Today you received the following chemotherapy agents Cisplatin  To help prevent nausea and vomiting after your treatment, we encourage you to take your nausea medication   If you develop nausea and vomiting that is not controlled by your nausea medication, call the clinic.   BELOW ARE SYMPTOMS THAT SHOULD BE REPORTED IMMEDIATELY:  *FEVER GREATER THAN 100.5 F  *CHILLS WITH OR WITHOUT FEVER  NAUSEA AND VOMITING THAT IS NOT CONTROLLED WITH YOUR NAUSEA MEDICATION  *UNUSUAL SHORTNESS OF BREATH  *UNUSUAL BRUISING OR BLEEDING  TENDERNESS IN MOUTH AND THROAT WITH OR WITHOUT PRESENCE OF ULCERS  *URINARY PROBLEMS  *BOWEL PROBLEMS  UNUSUAL RASH Items with * indicate a potential emergency and should be followed up as soon as possible.  Feel free to call the clinic you have any questions or concerns. The clinic phone number is [phone].  Please show the CHEMO ALERT CARD at check-in to the Emergency Department and triage nurse.   

## 2015-11-02 ENCOUNTER — Encounter: Payer: Self-pay | Admitting: Radiation Oncology

## 2015-11-02 ENCOUNTER — Ambulatory Visit
Admission: RE | Admit: 2015-11-02 | Discharge: 2015-11-02 | Disposition: A | Discharge status: Home / Self Care | Payer: BLUE CROSS/BLUE SHIELD | Source: Ambulatory Visit | Attending: Radiation Oncology | Admitting: Radiation Oncology

## 2015-11-02 ENCOUNTER — Encounter (HOSPITAL_BASED_OUTPATIENT_CLINIC_OR_DEPARTMENT_OTHER): Payer: Self-pay | Admitting: *Deleted

## 2015-11-02 VITALS — BP 139/95 | HR 78 | Temp 97.9°F | Ht 62.0 in | Wt 207.0 lb

## 2015-11-02 DIAGNOSIS — C531 Malignant neoplasm of exocervix: Secondary | ICD-10-CM

## 2015-11-02 DIAGNOSIS — Z510 Encounter for antineoplastic radiation therapy: Secondary | ICD-10-CM | POA: Diagnosis not present

## 2015-11-02 NOTE — Progress Notes (Signed)
NPO AFTER MN.  ARRIVE AT [id number].  CURRENT LAB RESULTS IN CHART AND EPIC.  MAY TAKE PRN MEDS IF NEEDED FOR PAIN/ NAUSEA AM DOS W/ SIPS OF WATER.  PER PT DOES NOT WANT BP OR IV ACCESS DONE RIGHT ARM DUE TO PREVIOUS SUPERFICIAL THROMBUS.  ALTHOUGH THIS ISSUE IS RESOLVED PER DR LIVESAY NOTE IN EPIC (10-28-2015).

## 2015-11-02 NOTE — Progress Notes (Signed)
  Radiation Oncology         [phone] ________________________________  Name: Lindsey Hughes MRN: [id number]  Date: 11/02/2015  DOB: 13-Feb-1971    Weekly Radiation Therapy Management    ICD-9-CM ICD-10-CM   1. Malignant neoplasm of exocervix (HCC) 180.1 C53.1      Current Dose: 37.8 Gy     Planned Dose:  54+ Gy  Narrative . . . . . . . . The patient presents for routine under treatment assessment.                                   Janna Welles has completed 21 fractions to her pelvis. She denies having pain today. She reports having nausea/vomiting and diarrhea x 2 after chemotherapy yesterday. She reports having diarrhea about 6 times per day and is taking lomotil 4 times daily. She denies having any dysuria. She reports having some vaginal spotting on Saturday. She denies having skin irritation or fatigue. Her pain is well controlled. She took one pain pill after leaving the hospital yesterday because her head was hurting.                                  Set-up films were reviewed.                                 The chart was checked. Physical Findings. . .  height is 5\' 2"  (1.575 m) and weight is 207 lb (93.895 kg). Her oral temperature is 97.9 F (36.6 C). Her blood pressure is 139/95 and her pulse is 78. Her oxygen saturation is 100%. . Weight essentially stable.  No significant changes. Alert and in no acute distress. The lungs are clear. The heart has a regular rhythm and rate your the abdomen is soft and nontender with normal bowel sounds Impression . . . . . . . The patient is tolerating radiation. Plan . . . . . . . . . . . . Continue treatment as planned. Patient is scheduled for her first brachytherapy procedure July 18 along with Dr. Denman George.  ________________________________   Blair Promise, PhD, MD   This document serves as a record of services personally performed by Gery Pray, MD. It was created on his behalf by Arlyce Harman, a trained medical scribe. The  creation of this record is based on the scribe's personal observations and the provider's statements to them. This document has been checked and approved by the attending provider.

## 2015-11-02 NOTE — Progress Notes (Signed)
Lindsey Hughes has completed 21 fractions to her pelvis.  She denies having pain today.  She reports having nausea/vomiting and diarrhea x 2 after chemotherapy yesterday.  She reports having diarrhea about 6 times per day and is taking Imodium 4 times daily.  She denies having any dysuria.  She reports having some vaginal spotting on Saturday.  She denies having skin irritation or fatigue.  BP 139/95 mmHg  Pulse 78  Temp(Src) 97.9 F (36.6 C) (Oral)  Ht 5\' 2"  (1.575 m)  Wt 207 lb (93.895 kg)  BMI 37.85 kg/m2  SpO2 100%   Wt Readings from Last 3 Encounters:  11/02/15 207 lb (93.895 kg)  10/29/15 208 lb 3.2 oz (94.439 kg)  10/28/15 206 lb 6.4 oz (93.622 kg)

## 2015-11-03 ENCOUNTER — Ambulatory Visit
Admission: RE | Admit: 2015-11-03 | Discharge: 2015-11-03 | Disposition: A | Discharge status: Home / Self Care | Payer: BLUE CROSS/BLUE SHIELD | Source: Ambulatory Visit | Attending: Radiation Oncology | Admitting: Radiation Oncology

## 2015-11-03 DIAGNOSIS — Z510 Encounter for antineoplastic radiation therapy: Secondary | ICD-10-CM | POA: Diagnosis not present

## 2015-11-04 ENCOUNTER — Ambulatory Visit
Admission: RE | Admit: 2015-11-04 | Discharge: 2015-11-04 | Disposition: A | Discharge status: Home / Self Care | Payer: BLUE CROSS/BLUE SHIELD | Source: Ambulatory Visit | Attending: Radiation Oncology | Admitting: Radiation Oncology

## 2015-11-04 DIAGNOSIS — Z510 Encounter for antineoplastic radiation therapy: Secondary | ICD-10-CM | POA: Diagnosis not present

## 2015-11-05 ENCOUNTER — Telehealth: Payer: Self-pay | Admitting: *Deleted

## 2015-11-05 ENCOUNTER — Ambulatory Visit
Admission: RE | Admit: 2015-11-05 | Discharge: 2015-11-05 | Disposition: A | Discharge status: Home / Self Care | Payer: BLUE CROSS/BLUE SHIELD | Source: Ambulatory Visit | Attending: Radiation Oncology | Admitting: Radiation Oncology

## 2015-11-05 DIAGNOSIS — Z510 Encounter for antineoplastic radiation therapy: Secondary | ICD-10-CM | POA: Diagnosis not present

## 2015-11-05 DIAGNOSIS — R300 Dysuria: Secondary | ICD-10-CM

## 2015-11-05 DIAGNOSIS — C531 Malignant neoplasm of exocervix: Secondary | ICD-10-CM

## 2015-11-05 NOTE — Telephone Encounter (Addendum)
Told Lindsey Hughes that Dr. Marko Plume recommended trying Azo for urinary discomfort.  The urine culture done 10-25-14 was negative for infection.   She needs to push fluids 8 oz !hour while awake.  Do sitz baths.  She needs to call if she develops a temperature of 101.5 or higher. Will  Obtain a urine specimen on 11-08-15 with other labs for U/A C&S. Lindsey. [name] verbalized understanding.

## 2015-11-05 NOTE — Addendum Note (Signed)
Addended by: Baruch Merl on: 11/05/2015 03:35 PM   Modules accepted: Orders

## 2015-11-05 NOTE — Telephone Encounter (Signed)
Pt called and left message requesting medication for her voiding problem.  Spoke with pt and was informed that pt experienced burning with urination for past 2 days.  Denied fever; stated yellow urine color with foul odor.   Instructed pt to come in for urine sample checked and possibly to see Jenny Reichmann, NP symptom management .  Pt stated there was no way she could come in today.  Stated Dr. Marko Plume had called in Amoxicillin for past 2 times when pt had the same problem.  Informed pt that message will be relayed to Dr. Marko Plume and Barbaraann Share, desk nurse.   Pt understood that she will be contacted with further instructions from md. Pt's   Phone    [phone].

## 2015-11-08 ENCOUNTER — Encounter: Payer: Self-pay | Admitting: Oncology

## 2015-11-08 ENCOUNTER — Other Ambulatory Visit: Payer: Self-pay | Admitting: Oncology

## 2015-11-08 ENCOUNTER — Ambulatory Visit: Payer: BLUE CROSS/BLUE SHIELD

## 2015-11-08 ENCOUNTER — Ambulatory Visit (HOSPITAL_BASED_OUTPATIENT_CLINIC_OR_DEPARTMENT_OTHER): Payer: BLUE CROSS/BLUE SHIELD

## 2015-11-08 ENCOUNTER — Other Ambulatory Visit (HOSPITAL_BASED_OUTPATIENT_CLINIC_OR_DEPARTMENT_OTHER): Payer: BLUE CROSS/BLUE SHIELD

## 2015-11-08 ENCOUNTER — Ambulatory Visit
Admission: RE | Admit: 2015-11-08 | Discharge: 2015-11-08 | Disposition: A | Discharge status: Home / Self Care | Payer: BLUE CROSS/BLUE SHIELD | Source: Ambulatory Visit | Attending: Radiation Oncology | Admitting: Radiation Oncology

## 2015-11-08 ENCOUNTER — Telehealth: Payer: Self-pay | Admitting: Oncology

## 2015-11-08 VITALS — BP 119/81 | HR 86 | Temp 97.8°F | Resp 19

## 2015-11-08 DIAGNOSIS — R300 Dysuria: Secondary | ICD-10-CM

## 2015-11-08 DIAGNOSIS — Z452 Encounter for adjustment and management of vascular access device: Secondary | ICD-10-CM | POA: Diagnosis not present

## 2015-11-08 DIAGNOSIS — C531 Malignant neoplasm of exocervix: Secondary | ICD-10-CM

## 2015-11-08 DIAGNOSIS — Z95828 Presence of other vascular implants and grafts: Secondary | ICD-10-CM

## 2015-11-08 DIAGNOSIS — C530 Malignant neoplasm of endocervix: Secondary | ICD-10-CM

## 2015-11-08 DIAGNOSIS — Z510 Encounter for antineoplastic radiation therapy: Secondary | ICD-10-CM | POA: Diagnosis not present

## 2015-11-08 LAB — URINALYSIS, MICROSCOPIC - CHCC
BILIRUBIN (URINE): NEGATIVE
GLUCOSE UR CHCC: NEGATIVE mg/dL
KETONES: NEGATIVE mg/dL
Leukocyte Esterase: NEGATIVE
Nitrite: NEGATIVE
Protein: 30 mg/dL
Specific Gravity, Urine: 1.03 (ref 1.003–1.035)
Urobilinogen, UR: 0.2 mg/dL (ref 0.2–1)
pH: 6.5 (ref 4.6–8.0)

## 2015-11-08 LAB — CBC WITH DIFFERENTIAL/PLATELET
BASO%: 0.5 % (ref 0.0–2.0)
Basophils Absolute: 0 10*3/uL (ref 0.0–0.1)
EOS ABS: 0.1 10*3/uL (ref 0.0–0.5)
EOS%: 2.5 % (ref 0.0–7.0)
HCT: 29.5 % — ABNORMAL LOW (ref 34.8–46.6)
HGB: 10.1 g/dL — ABNORMAL LOW (ref 11.6–15.9)
LYMPH%: 13.3 % — AB (ref 14.0–49.7)
MCH: 28.9 pg (ref 25.1–34.0)
MCHC: 34.2 g/dL (ref 31.5–36.0)
MCV: 84.5 fL (ref 79.5–101.0)
MONO#: 0.3 10*3/uL (ref 0.1–0.9)
MONO%: 14.3 % — AB (ref 0.0–14.0)
NEUT#: 1.4 10*3/uL — ABNORMAL LOW (ref 1.5–6.5)
NEUT%: 69.4 % (ref 38.4–76.8)
PLATELETS: 100 10*3/uL — AB (ref 145–400)
RBC: 3.49 10*6/uL — AB (ref 3.70–5.45)
RDW: 14.7 % — ABNORMAL HIGH (ref 11.2–14.5)
WBC: 2 10*3/uL — AB (ref 3.9–10.3)
lymph#: 0.3 10*3/uL — ABNORMAL LOW (ref 0.9–3.3)

## 2015-11-08 LAB — COMPREHENSIVE METABOLIC PANEL
ALT: 26 U/L (ref 0–55)
ANION GAP: 11 meq/L (ref 3–11)
AST: 18 U/L (ref 5–34)
Albumin: 3.3 g/dL — ABNORMAL LOW (ref 3.5–5.0)
Alkaline Phosphatase: 66 U/L (ref 40–150)
BILIRUBIN TOTAL: 0.3 mg/dL (ref 0.20–1.20)
BUN: 13.3 mg/dL (ref 7.0–26.0)
CHLORIDE: 103 meq/L (ref 98–109)
CO2: 25 meq/L (ref 22–29)
CREATININE: 0.8 mg/dL (ref 0.6–1.1)
Calcium: 9 mg/dL (ref 8.4–10.4)
EGFR: 83 mL/min/{1.73_m2} — AB (ref >90)
GLUCOSE: 113 mg/dL (ref 70–140)
POTASSIUM: 3.6 meq/L (ref 3.5–5.1)
SODIUM: 139 meq/L (ref 136–145)
Total Protein: 6.6 g/dL (ref 6.4–8.3)

## 2015-11-08 LAB — MAGNESIUM: Magnesium: 1.7 mg/dl (ref 1.5–2.5)

## 2015-11-08 MED ORDER — SODIUM CHLORIDE 0.9 % IJ SOLN
10.0000 mL | Freq: Once | INTRAMUSCULAR | Status: AC
  Administered 2015-11-08: 10 mL via INTRAVENOUS
  Filled 2015-11-08: qty 10

## 2015-11-08 MED ORDER — SODIUM CHLORIDE 0.9 % IJ SOLN
10.0000 mL | INTRAMUSCULAR | Status: DC | PRN
  Filled 2015-11-08: qty 10

## 2015-11-08 MED ORDER — HEPARIN SOD (PORK) LOCK FLUSH 100 UNIT/ML IV SOLN
500.0000 [IU] | Freq: Once | INTRAVENOUS | Status: AC
  Administered 2015-11-08: 500 [IU] via INTRAVENOUS
  Filled 2015-11-08: qty 5

## 2015-11-08 NOTE — Progress Notes (Signed)
Judiann stopped by the clinic and said she has been having dysuria.  She said it is not from skin irritation and feels like it is "coming from inside."  She took AZO over the weekend without relief.  Advised her to try urinating in a sitz bath to see if that helps.  Also advised her that Dr. Sondra Come will be notified this afternoon to see if there are any other options to help with the burning.  She had a urinalysis today.  She said she did not have chemotherapy today due to her white count.

## 2015-11-08 NOTE — H&P (Signed)
Radiation Oncology         [phone] ________________________________  History and physical examination  Name: Lindsey Hughes MRN: [id number]  Date: 10/06/2015  DOB: [birth year]  [id number], Anderson Malta L, PA-C  No ref. 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   HISTORY OF PRESENT ILLNESS::Lindsey Hughes is a 45 y.o. 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 Patient is currently undergoing external beam radiation therapy along with radiosensitizing chemotherapy. Her pelvic and low back pain has improved with her treatments as well as her vaginal bleeding. Patient is now taken to the operating room for her first brachytherapy procedure using iridium 192 with the tandem ring system Dr. Denman George will be present for exam under anesthesia and  placement of cervical sleeve for multiple tandem ring insertions.   PAST MEDICAL HISTORY:  has a past medical history of Hypothyroidism; Anemia; Depression; Headache; Radiation; GERD (gastroesophageal reflux disease); Chemotherapy-induced nausea; Chemotherapy induced diarrhea; Port-a-cath in place; History of thrombophlebitis (RESOLVED-- but per pt prefer no bp or  puncture done in right arm); and Cervical cancer [facility]) (oncologist-  dr livesay/  dr Jaceion Aday/  dr Denman George).    PAST SURGICAL HISTORY: Past Surgical History  Procedure Laterality Date  . Cesarean section  1991   . Carpal tunnel release Bilateral 2002 and 2009  . Lasik Left 2015  . Dilation and curettage of uterus N/A 09/03/2015    Procedure: EXAM UNDER ANESTHESIA  WITH FROZEN SECTION CERVICAL BIOPSY & ENDOCERVICAL CURRETTINGS;  Surgeon: Aloha Gell, MD;  Location: [facility];  Service: Gynecology;  Laterality: N/A;  . Tubal ligation  1996  . Portacath placement  10-11-2015  . Echo stress test  12-10-2014    normal w/ no evidence for  stress-induced ischemia/  normal LV function and wall motion , ef 65-70%    FAMILY HISTORY: family history includes Brain cancer in her father; CAD in her brother and mother; Lung cancer in her father and mother.  SOCIAL HISTORY:  reports that she has never smoked. She has never used smokeless tobacco. She reports that she does not drink alcohol or use illicit drugs.  ALLERGIES: Zofran  MEDICATIONS:  No current facility-administered medications for this encounter.   Current Outpatient Prescriptions  Medication Sig Dispense Refill  . acetaminophen (TYLENOL) 500 MG tablet Take 500 mg by mouth every 6 (six) hours as needed. Reported on 11/02/2015    . ALPRAZolam (XANAX) 0.25 MG tablet Take 0.25 mg by mouth as needed for anxiety. Reported on 10/21/2015    . ibuprofen (ADVIL,MOTRIN) 800 MG tablet Take 800 mg by mouth every 8 (eight) hours as needed for mild pain or moderate pain. Reported on 11/02/2015  4  . lidocaine-prilocaine (EMLA) cream Apply  1-2 hrs prior to Portacath access prn. 30 g 1  . loperamide (IMODIUM) 1 MG/5ML solution Take by mouth as needed for diarrhea or loose stools. Reported on 10/29/2015    . LORazepam (ATIVAN) 1 MG tablet Place 1/2 -1 tabled under the tongue or swallow every 6 hrs as needed for nausea.  Will make drowsy. 20 tablet 0  . magic mouthwash SOLN Take 5-10 mLs by mouth every 4 (four) hours as needed for mouth pain. 1:1 ratio 2% viscous lidocaine;benadryl, and nystatin 480 mL 1  . oxyCODONE-acetaminophen (ROXICET) 5-325 MG tablet Take 1 tablet by mouth every 4 (four) hours as needed for severe pain. Oxycodone/percocet/roxicet,  30 tablets, no refills 12 tablet 0  . pantoprazole (PROTONIX) 40 MG tablet Take 1 tablet (40 mg total) by mouth daily. (Patient taking differently: Take 40 mg by mouth every morning. ) 30 tablet 2  . prochlorperazine (COMPAZINE) 10 MG tablet Take 1 tablet (10 mg total) by mouth every 6 (six) hours as needed for nausea or vomiting. 30 tablet 1  .  triamcinolone (KENALOG) 0.1 % paste Apply to mouth ulers twice a day. (Patient taking differently: as needed. Apply to mouth ulers twice a day.) 5 g 1  . levothyroxine (SYNTHROID, LEVOTHROID) 50 MCG tablet Take 50 mcg by mouth daily before breakfast. Reported on 11/02/2015--  Pt per last dose 09-24-2015  approx.     Facility-Administered Medications Ordered in Other Encounters  Medication Dose Route Frequency Provider Last Rate Last Dose  . famotidine (PEPCID) IVPB 20 mg premix  20 mg Intravenous Q12H Lennis P Livesay, MD   20 mg at 10/07/15 1328    REVIEW OF SYSTEMS:  A 15 point review of systems is documented in the electronic medical record. This was obtained by the nursing staff. However, I reviewed this with the patient to discuss relevant findings and make appropriate changes. Patient has had some nausea with her Radiation and chemotherapy. She has also had some diarrhea with her treatment. Dysuria is also one of her side effects    PHYSICAL EXAM:  height is 5\' 2"  (1.575 m) and weight is 207 lb 6.4 oz (94.076 kg). Her oral temperature is 98.2 F (36.8 C). Her blood pressure is 126/84 and her pulse is 81. Her respiration is 16 and oxygen saturation is 100%.  General: Alert and oriented, in no acute distress HEENT: Head is normocephalic. Extraocular movements are intact. Oropharynx is clear. Teeth in good repair Neck: Neck is supple, no palpable cervical or supraclavicular lymphadenopathy. Heart: Regular in rate and rhythm with no murmurs, rubs, or gallops. Chest: Clear to auscultation bilaterally, with no rhonchi, wheezes, or rales. Abdomen: Soft, nontender, nondistended, with no rigidity or guarding. Extremities: No cyanosis or edema. Lymphatics: see Neck Exam Skin: No concerning lesions. Musculoskeletal: symmetric strength and muscle tone throughout. Neurologic: Cranial nerves II through XII are grossly intact. No obvious focalities. Speech is fluent. Coordination is  intact. Psychiatric: Judgment and insight are intact. Affect is appropriate. Pelvic exam deferred until operating room procedure   LABORATORY DATA:  Lab Results  Component Value Date   WBC 2.0* 11/08/2015   HGB 10.1* 11/08/2015   HCT 29.5* 11/08/2015   MCV 84.5 11/08/2015   PLT 100* 11/08/2015   NEUTROABS 1.4* 11/08/2015   Lab Results  Component Value Date   NA 139 11/08/2015   K 3.6 11/08/2015   CL 104 10/11/2015   CO2 25 11/08/2015   GLUCOSE 113 11/08/2015   CREATININE 0.8 11/08/2015   CALCIUM 9.0 11/08/2015      RADIOGRAPHY: Ir Fluoro Guide Cv Line Right  10/11/2015  INDICATION: 45 year old with cervical cancer. Port-A-Cath needed for chemotherapy. EXAM: FLUOROSCOPIC AND ULTRASOUND GUIDED PLACEMENT OF A SUBCUTANEOUS PORT COMPARISON:  None. MEDICATIONS: Ancef 2 g; The antibiotic was administered within an appropriate time interval prior to skin puncture. ANESTHESIA/SEDATION: Versed 3.0 mg IV; Fentanyl 150 mcg IV; Moderate Sedation Time:  35 minutes The patient was continuously monitored during the procedure by the interventional radiology nurse under my direct supervision. FLUOROSCOPY TIME:  24 seconds (4 mGy) COMPLICATIONS: None immediate. PROCEDURE: The procedure, risks, benefits, and alternatives were explained to the patient. Questions regarding the procedure  were encouraged and answered. The patient understands and consents to the procedure. Patient was placed supine on the interventional table. Ultrasound confirmed a patent right internal jugular vein. The right chest and neck were cleaned with a skin antiseptic and a sterile drape was placed. Maximal barrier sterile technique was utilized including caps, mask, sterile gowns, sterile gloves, sterile drape, hand hygiene and skin antiseptic. The right neck was anesthetized with 1% lidocaine. Small incision was made in the right neck with a blade. Micropuncture set was placed in the right internal jugular vein with ultrasound  guidance. The micropuncture wire was used for measurement purposes. The right chest was anesthetized with 1% lidocaine with epinephrine. #15 blade was used to make an incision and a subcutaneous port pocket was formed. [place] was assembled. Subcutaneous tunnel was formed with a stiff tunneling device. The port catheter was brought through the subcutaneous tunnel. The port was placed in the subcutaneous pocket. The micropuncture set was exchanged for a peel-away sheath. The catheter was placed through the peel-away sheath and the tip was positioned at the superior cavoatrial junction. Catheter placement was confirmed with fluoroscopy. The port was accessed and flushed with heparinized saline. The port pocket was closed using two layers of absorbable sutures and Dermabond. The vein skin site was closed using a single layer of absorbable suture and Dermabond. Sterile dressings were applied. Patient tolerated the procedure well without an immediate complication. Ultrasound and fluoroscopic images were taken and saved for this procedure. IMPRESSION: Placement of a subcutaneous port device. Catheter tip at the superior cavoatrial junction. Electronically Signed   By: Markus Daft M.D.   On: 10/11/2015 14:52   Ir US Guide Vasc Access Right  10/11/2015  INDICATION: 45 year old with cervical cancer. Port-A-Cath needed for chemotherapy. EXAM: FLUOROSCOPIC AND ULTRASOUND GUIDED PLACEMENT OF A SUBCUTANEOUS PORT COMPARISON:  None. MEDICATIONS: Ancef 2 g; The antibiotic was administered within an appropriate time interval prior to skin puncture. ANESTHESIA/SEDATION: Versed 3.0 mg IV; Fentanyl 150 mcg IV; Moderate Sedation Time:  35 minutes The patient was continuously monitored during the procedure by the interventional radiology nurse under my direct supervision. FLUOROSCOPY TIME:  24 seconds (4 mGy) COMPLICATIONS: None immediate. PROCEDURE: The procedure, risks, benefits, and alternatives were explained to the  patient. Questions regarding the procedure were encouraged and answered. The patient understands and consents to the procedure. Patient was placed supine on the interventional table. Ultrasound confirmed a patent right internal jugular vein. The right chest and neck were cleaned with a skin antiseptic and a sterile drape was placed. Maximal barrier sterile technique was utilized including caps, mask, sterile gowns, sterile gloves, sterile drape, hand hygiene and skin antiseptic. The right neck was anesthetized with 1% lidocaine. Small incision was made in the right neck with a blade. Micropuncture set was placed in the right internal jugular vein with ultrasound guidance. The micropuncture wire was used for measurement purposes. The right chest was anesthetized with 1% lidocaine with epinephrine. #15 blade was used to make an incision and a subcutaneous port pocket was formed. [place] was assembled. Subcutaneous tunnel was formed with a stiff tunneling device. The port catheter was brought through the subcutaneous tunnel. The port was placed in the subcutaneous pocket. The micropuncture set was exchanged for a peel-away sheath. The catheter was placed through the peel-away sheath and the tip was positioned at the superior cavoatrial junction. Catheter placement was confirmed with fluoroscopy. The port was accessed and flushed with heparinized saline. The port  pocket was closed using two layers of absorbable sutures and Dermabond. The vein skin site was closed using a single layer of absorbable suture and Dermabond. Sterile dressings were applied. Patient tolerated the procedure well without an immediate complication. Ultrasound and fluoroscopic images were taken and saved for this procedure. IMPRESSION: Placement of a subcutaneous port device. Catheter tip at the superior cavoatrial junction. Electronically Signed   By: Markus Daft M.D.   On: 10/11/2015 14:52      IMPRESSION: Stage II-B squamous cell  carcinoma cervix. The patient is now ready to proceed with brachytherapy as part of her overall management.  PLAN: Patient will proceed to the operating room for scheduled case on July 18 at 7:15 AM.    ------------------------------------------------  Blair Promise, PhD, MD

## 2015-11-08 NOTE — Patient Instructions (Signed)

## 2015-11-08 NOTE — Progress Notes (Signed)
Per Dr. Marko Plume, hold today's treatment. Reschedule for 11/15/15. POF sent to scheduling. Patient verbalized understanding.

## 2015-11-08 NOTE — Telephone Encounter (Signed)
spoke w/ pt confirmed 7/24 apts times

## 2015-11-08 NOTE — Anesthesia Preprocedure Evaluation (Addendum)
Anesthesia Evaluation  Patient identified by MRN, date of birth, ID band Patient awake    Reviewed: Allergy & Precautions, NPO status , Patient's Chart, lab work & pertinent test results  History of Anesthesia Complications Negative for: history of anesthetic complications  Airway Mallampati: III  TM Distance: >3 FB Neck ROM: Full    Dental  (+) Teeth Intact, Dental Advisory Given,  Permanent bridge top left:   Pulmonary neg pulmonary ROS,    Pulmonary exam normal breath sounds clear to auscultation       Cardiovascular Normal cardiovascular exam Rhythm:Regular Rate:Normal  Stress Echo 2016: - LV size was normal. - LV global systolic function was normal. The estimated LV ejection fraction was 55-60%. - Normal wall motion; no LV regional wall motion abnormalities.   Neuro/Psych  Headaches, PSYCHIATRIC DISORDERS Depression    GI/Hepatic Neg liver ROS, GERD  Medicated and Controlled,  Endo/Other  Hypothyroidism Obesity   Renal/GU negative Renal ROS     Musculoskeletal negative musculoskeletal ROS (+)   Abdominal   Peds  Hematology  (+) Blood dyscrasia, anemia ,   Anesthesia Other Findings Day of surgery medications reviewed with the patient.  Cervical cancer-- radiation/chemo  Reproductive/Obstetrics                           Anesthesia Physical Anesthesia Plan  ASA: II  Anesthesia Plan: General   Post-op Pain Management:    Induction: Intravenous  Airway Management Planned: LMA  Additional Equipment:   Intra-op Plan:   Post-operative Plan: Extubation in OR  Informed Consent: I have reviewed the patients History and Physical, chart, labs and discussed the procedure including the risks, benefits and alternatives for the proposed anesthesia with the patient or authorized representative who has indicated his/her understanding and acceptance.   Dental advisory given  Plan  Discussed with: CRNA  Anesthesia Plan Comments: (Risks/benefits of general anesthesia discussed with patient including risk of damage to teeth, lips, gum, and tongue, nausea/vomiting, allergic reactions to medications, and the possibility of heart attack, stroke and death.  All patient questions answered.  Patient wishes to proceed.  )      Anesthesia Quick Evaluation

## 2015-11-09 ENCOUNTER — Ambulatory Visit
Admission: RE | Admit: 2015-11-09 | Discharge: 2015-11-09 | Disposition: A | Discharge status: Home / Self Care | Payer: BLUE CROSS/BLUE SHIELD | Source: Ambulatory Visit | Attending: Radiation Oncology | Admitting: Radiation Oncology

## 2015-11-09 ENCOUNTER — Encounter (HOSPITAL_BASED_OUTPATIENT_CLINIC_OR_DEPARTMENT_OTHER): Payer: Self-pay | Admitting: *Deleted

## 2015-11-09 ENCOUNTER — Ambulatory Visit (HOSPITAL_BASED_OUTPATIENT_CLINIC_OR_DEPARTMENT_OTHER): Payer: BLUE CROSS/BLUE SHIELD | Admitting: Anesthesiology

## 2015-11-09 ENCOUNTER — Encounter (HOSPITAL_BASED_OUTPATIENT_CLINIC_OR_DEPARTMENT_OTHER)
Admission: RE | Disposition: A | Discharge status: Home / Self Care | Payer: Self-pay | Source: Ambulatory Visit | Attending: Radiation Oncology

## 2015-11-09 ENCOUNTER — Ambulatory Visit (HOSPITAL_COMMUNITY)
Admission: RE | Admit: 2015-11-09 | Discharge: 2015-11-09 | Disposition: A | Discharge status: Home / Self Care | Payer: BLUE CROSS/BLUE SHIELD | Source: Ambulatory Visit | Attending: Radiation Oncology | Admitting: Radiation Oncology

## 2015-11-09 ENCOUNTER — Ambulatory Visit: Payer: BLUE CROSS/BLUE SHIELD

## 2015-11-09 ENCOUNTER — Encounter: Payer: Self-pay | Admitting: *Deleted

## 2015-11-09 ENCOUNTER — Ambulatory Visit (HOSPITAL_BASED_OUTPATIENT_CLINIC_OR_DEPARTMENT_OTHER)
Admission: RE | Admit: 2015-11-09 | Discharge: 2015-11-09 | Disposition: A | Discharge status: Home / Self Care | Payer: BLUE CROSS/BLUE SHIELD | Source: Ambulatory Visit | Attending: Radiation Oncology | Admitting: Radiation Oncology

## 2015-11-09 VITALS — BP 117/78 | HR 84 | Temp 97.7°F

## 2015-11-09 DIAGNOSIS — C539 Malignant neoplasm of cervix uteri, unspecified: Secondary | ICD-10-CM | POA: Insufficient documentation

## 2015-11-09 DIAGNOSIS — Z808 Family history of malignant neoplasm of other organs or systems: Secondary | ICD-10-CM | POA: Diagnosis not present

## 2015-11-09 DIAGNOSIS — K219 Gastro-esophageal reflux disease without esophagitis: Secondary | ICD-10-CM | POA: Insufficient documentation

## 2015-11-09 DIAGNOSIS — C531 Malignant neoplasm of exocervix: Secondary | ICD-10-CM

## 2015-11-09 DIAGNOSIS — Z801 Family history of malignant neoplasm of trachea, bronchus and lung: Secondary | ICD-10-CM | POA: Insufficient documentation

## 2015-11-09 DIAGNOSIS — E039 Hypothyroidism, unspecified: Secondary | ICD-10-CM | POA: Diagnosis not present

## 2015-11-09 DIAGNOSIS — E669 Obesity, unspecified: Secondary | ICD-10-CM | POA: Insufficient documentation

## 2015-11-09 DIAGNOSIS — Z6837 Body mass index (BMI) 37.0-37.9, adult: Secondary | ICD-10-CM | POA: Insufficient documentation

## 2015-11-09 HISTORY — DX: Nausea: R11.0

## 2015-11-09 HISTORY — DX: Nausea: T45.1X5A

## 2015-11-09 HISTORY — PX: TANDEM RING INSERTION: SHX6199

## 2015-11-09 HISTORY — DX: Presence of other vascular implants and grafts: Z95.828

## 2015-11-09 HISTORY — DX: Malignant neoplasm of cervix uteri, unspecified: C53.9

## 2015-11-09 HISTORY — DX: Personal history of thrombophlebitis: Z86.72

## 2015-11-09 HISTORY — DX: Gastro-esophageal reflux disease without esophagitis: K21.9

## 2015-11-09 HISTORY — DX: Reserved for concepts with insufficient information to code with codable children: IMO0002

## 2015-11-09 HISTORY — DX: Toxic gastroenteritis and colitis: K52.1

## 2015-11-09 HISTORY — DX: Reserved for inherently not codable concepts without codable children: IMO0001

## 2015-11-09 SURGERY — INSERTION, UTERINE TANDEM AND RING OR CYLINDER, FOR BRACHYTHERAPY
Anesthesia: General | Site: Cervix

## 2015-11-09 MED ORDER — KETOROLAC TROMETHAMINE 30 MG/ML IJ SOLN
INTRAMUSCULAR | Status: AC
  Filled 2015-11-09: qty 1

## 2015-11-09 MED ORDER — KETOROLAC TROMETHAMINE 30 MG/ML IJ SOLN
INTRAMUSCULAR | Status: DC | PRN
  Administered 2015-11-09: 30 mg via INTRAVENOUS

## 2015-11-09 MED ORDER — HYDROMORPHONE HCL 1 MG/ML IJ SOLN
0.5000 mg | Freq: Once | INTRAMUSCULAR | Status: AC
  Administered 2015-11-09: 0.5 mg via INTRAVENOUS
  Filled 2015-11-09: qty 1

## 2015-11-09 MED ORDER — OXYCODONE-ACETAMINOPHEN 5-325 MG PO TABS: 1.0000 | ORAL_TABLET | ORAL | Status: DC | PRN

## 2015-11-09 MED ORDER — HYDROMORPHONE HCL 4 MG/ML IJ SOLN
INTRAMUSCULAR | Status: AC
  Filled 2015-11-09: qty 1

## 2015-11-09 MED ORDER — MIDAZOLAM HCL 2 MG/2ML IJ SOLN
INTRAMUSCULAR | Status: AC
  Filled 2015-11-09: qty 2

## 2015-11-09 MED ORDER — LIDOCAINE 2% (20 MG/ML) 5 ML SYRINGE
INTRAMUSCULAR | Status: DC | PRN
  Administered 2015-11-09: 60 mg via INTRAVENOUS

## 2015-11-09 MED ORDER — HYDROMORPHONE HCL 1 MG/ML IJ SOLN
INTRAMUSCULAR | Status: DC | PRN
  Administered 2015-11-09 (×2): 1 mg via INTRAVENOUS

## 2015-11-09 MED ORDER — DEXAMETHASONE SODIUM PHOSPHATE 10 MG/ML IJ SOLN
INTRAMUSCULAR | Status: AC
  Filled 2015-11-09: qty 1

## 2015-11-09 MED ORDER — MIDAZOLAM HCL 5 MG/5ML IJ SOLN
INTRAMUSCULAR | Status: DC | PRN
  Administered 2015-11-09: 2 mg via INTRAVENOUS

## 2015-11-09 MED ORDER — WATER FOR IRRIGATION, STERILE IR SOLN
Status: DC | PRN
  Administered 2015-11-09: 3000 mL via INTRAVESICAL

## 2015-11-09 MED ORDER — DEXAMETHASONE SODIUM PHOSPHATE 4 MG/ML IJ SOLN
INTRAMUSCULAR | Status: DC | PRN
  Administered 2015-11-09: 10 mg via INTRAVENOUS

## 2015-11-09 MED ORDER — CEFAZOLIN SODIUM-DEXTROSE 2-4 GM/100ML-% IV SOLN
2.0000 g | Freq: Once | INTRAVENOUS | Status: AC
  Administered 2015-11-09: 2 g via INTRAVENOUS
  Filled 2015-11-09: qty 100

## 2015-11-09 MED ORDER — CEFAZOLIN SODIUM-DEXTROSE 2-4 GM/100ML-% IV SOLN
INTRAVENOUS | Status: AC
  Filled 2015-11-09: qty 100

## 2015-11-09 MED ORDER — FENTANYL CITRATE (PF) 100 MCG/2ML IJ SOLN
INTRAMUSCULAR | Status: AC
  Filled 2015-11-09: qty 2

## 2015-11-09 MED ORDER — PROMETHAZINE HCL 25 MG/ML IJ SOLN
6.2500 mg | INTRAMUSCULAR | Status: DC | PRN
  Filled 2015-11-09: qty 1

## 2015-11-09 MED ORDER — FENTANYL CITRATE (PF) 100 MCG/2ML IJ SOLN
25.0000 ug | INTRAMUSCULAR | Status: DC | PRN
  Administered 2015-11-09: 50 ug via INTRAVENOUS
  Filled 2015-11-09: qty 1

## 2015-11-09 MED ORDER — PROPOFOL 10 MG/ML IV BOLUS
INTRAVENOUS | Status: DC | PRN
Start: 2015-11-09 — End: 2015-11-09
  Administered 2015-11-09: 180 mg via INTRAVENOUS

## 2015-11-09 MED ORDER — PROPOFOL 10 MG/ML IV BOLUS
INTRAVENOUS | Status: AC
  Filled 2015-11-09: qty 20

## 2015-11-09 MED ORDER — LACTATED RINGERS IV SOLN
INTRAVENOUS | Status: DC
  Administered 2015-11-09 (×2): via INTRAVENOUS
  Filled 2015-11-09: qty 1000

## 2015-11-09 MED ORDER — WHITE PETROLATUM GEL
Status: AC
  Filled 2015-11-09: qty 5

## 2015-11-09 MED ORDER — FENTANYL CITRATE (PF) 100 MCG/2ML IJ SOLN
INTRAMUSCULAR | Status: DC | PRN
  Administered 2015-11-09: 50 ug via INTRAVENOUS
  Administered 2015-11-09 (×2): 25 ug via INTRAVENOUS
  Administered 2015-11-09: 50 ug via INTRAVENOUS
  Administered 2015-11-09 (×2): 25 ug via INTRAVENOUS

## 2015-11-09 SURGICAL SUPPLY — 41 items
BAG URINE DRAINAGE (UROLOGICAL SUPPLIES) ×3 IMPLANT
BNDG CONFORM 2 STRL LF (GAUZE/BANDAGES/DRESSINGS) IMPLANT
CATH FOLEY 2WAY SLVR  5CC 16FR (CATHETERS) ×2
CATH FOLEY 2WAY SLVR  5CC 18FR (CATHETERS) ×2
CATH FOLEY 2WAY SLVR  5CC 20FR (CATHETERS) ×2
CATH FOLEY 2WAY SLVR 5CC 16FR (CATHETERS) ×1 IMPLANT
CATH FOLEY 2WAY SLVR 5CC 18FR (CATHETERS) ×1 IMPLANT
CATH FOLEY 2WAY SLVR 5CC 20FR (CATHETERS) ×1 IMPLANT
COVER BACK TABLE 60X90IN (DRAPES) ×3 IMPLANT
DRAPE LG THREE QUARTER DISP (DRAPES) ×3 IMPLANT
DRAPE UNDERBUTTOCKS STRL (DRAPE) ×3 IMPLANT
DRSG PAD ABDOMINAL 8X10 ST (GAUZE/BANDAGES/DRESSINGS) ×3 IMPLANT
GLOVE BIO SURGEON STRL SZ7.5 (GLOVE) ×6 IMPLANT
GOWN STRL REUS W/ TWL LRG LVL3 (GOWN DISPOSABLE) ×2 IMPLANT
GOWN STRL REUS W/ TWL XL LVL3 (GOWN DISPOSABLE) ×1 IMPLANT
GOWN STRL REUS W/TWL LRG LVL3 (GOWN DISPOSABLE) ×4
GOWN STRL REUS W/TWL XL LVL3 (GOWN DISPOSABLE) ×2
HOLDER FOLEY CATH W/STRAP (MISCELLANEOUS) ×3 IMPLANT
KIT ROOM TURNOVER WOR (KITS) ×3 IMPLANT
LEGGING LITHOTOMY PAIR STRL (DRAPES) ×3 IMPLANT
MANIFOLD NEPTUNE II (INSTRUMENTS) IMPLANT
NEEDLE SPNL 22GX3.5 QUINCKE BK (NEEDLE) IMPLANT
PACK BASIN DAY SURGERY FS (CUSTOM PROCEDURE TRAY) ×3 IMPLANT
PACKING VAGINAL (PACKING) IMPLANT
PAD ABD 8X10 STRL (GAUZE/BANDAGES/DRESSINGS) ×3 IMPLANT
PAD OB MATERNITY 4.3X12.25 (PERSONAL CARE ITEMS) IMPLANT
PAD PREP 24X48 CUFFED NSTRL (MISCELLANEOUS) ×3 IMPLANT
PLUG CATH AND CAP STER (CATHETERS) IMPLANT
SET IRRIG Y TYPE TUR BLADDER L (SET/KITS/TRAYS/PACK) ×3 IMPLANT
SUT PROLENE 0 SH 30 (SUTURE) ×6 IMPLANT
SUT SILK 2 0 30  PSL (SUTURE)
SUT SILK 2 0 30 PSL (SUTURE) IMPLANT
SYR BULB IRRIGATION 50ML (SYRINGE) IMPLANT
SYR CONTROL 10ML LL (SYRINGE) IMPLANT
SYRINGE 10CC LL (SYRINGE) ×3 IMPLANT
TOWEL OR 17X24 6PK STRL BLUE (TOWEL DISPOSABLE) ×6 IMPLANT
TRAY DSU PREP LF (CUSTOM PROCEDURE TRAY) ×3 IMPLANT
TUBE CONNECTING 12'X1/4 (SUCTIONS) ×1
TUBE CONNECTING 12X1/4 (SUCTIONS) ×2 IMPLANT
WATER STERILE IRR 3000ML UROMA (IV SOLUTION) ×3 IMPLANT
WATER STERILE IRR 500ML POUR (IV SOLUTION) ×3 IMPLANT

## 2015-11-09 NOTE — Progress Notes (Signed)
Patient given 0.5 mg dilaudid IV before tandem equipment removal.

## 2015-11-09 NOTE — Interval H&P Note (Signed)
History and Physical Interval Note:  11/09/2015 7:23 AM  Lindsey Hughes  has presented today for surgery, with the diagnosis of EXOCERVIX   The various methods of treatment have been discussed with the patient and family. After consideration of risks, benefits and other options for treatment, the patient has consented to  Procedure(s): TANDEM RING INSERTION (N/A) as a surgical intervention .  The patient's history has been reviewed, patient examined, no change in status, stable for surgery.  I have reviewed the patient's chart and labs.  Questions were answered to the patient's satisfaction.     Gery Pray

## 2015-11-09 NOTE — Brief Op Note (Signed)
11/09/2015  8:53 AM  PATIENT:  Lindsey Hughes  45 y.o. female  PRE-OPERATIVE DIAGNOSIS:   Stage II-B poorly differentiated squamous cell carcinoma cervix  POST-OPERATIVE DIAGNOSIS:  same  PROCEDURE:  Procedure(s): TANDEM RING INSERTION (N/A)  SURGEON:  Surgeon(s) and Role:    * Gery Pray, MD - Primary    * Everitt Amber, MD - Assisting  PHYSICIAN ASSISTANT:   ASSISTANTS: none   ANESTHESIA:   general  EBL:  Total I/O In: 600 [I.V.:600] Out: 605 [Urine:600; Blood:5]  BLOOD ADMINISTERED:none  DRAINS: Urinary Catheter (Foley)   LOCAL MEDICATIONS USED:  NONE  SPECIMEN:  No Specimen  DISPOSITION OF SPECIMEN:  N/A  COUNTS:  YES  TOURNIQUET:  * No tourniquets in log *  DICTATION: The patient was taken to outpatient OR #1. Timeout was performed before the procedure was initiated. Patient had 2 g of Ancef infused by IV in the light of the patient's neutropenia The perineum and vaginal cavity was prepped by Dr. Denman George. A 16 French Foley catheter was initially placed however there was noted to be urine leakage around the catheter and therefore patient eventually had a 20 French Foley catheter placed during the procedure. Approximately 20 mL of sterile water was placed in the Foley balloon. On exam under anesthesia the patient was noted to have excellent response to her external beam and radiosensitizing chemotherapy. The cervical mass was actually noted to be along the anterior lip of the cervix and had decreased in size from approximately 6 cm down to 2 cm on clinical exam. There is no further parametrial extension noted. Patient had a weighted speculum placed. This was held in place by a 2. 0 Vicryl suture placed along the perineum by Dr. Denman George. Patient then had initiation of intraoperative ultrasound. Approximately 200 mL of sterile water was placed within the bladder for imaging purposes The patient proceeded to undergo sounding of the uterus. The uterus sounded to approximately 7 cm  and was noted to be in an anteverted position. Patient proceeded to undergo serial dilation of the cervical os. She then had a 60 mm cervical sleeve placed within the cervix and endometrial cavity. This was sewn in place with two 2.0 Prolene sutures. The initial suture was placed along the anterior lip of the cervix at the 12:00 position. The second suture was placed along the upper posterior vaginal wall. Patient then had placement of a 60 mm, 45 tandem within the cervical and endometrial cavity within the cervical sleeve. Excellent placement was noted on intraoperative ultrasound. Patient then had placement of a 45 ring with large shielding In place. Patient then had placement of a rectal paddle to shield the anterior rectum from high-dose radiation. Intraoperative ultrasound confirmed good placement of tandem and ring. Patient was subsequently transported to the recovery room in stable condition. Later in the day the patient will undergo planning and her first high-dose-rate treatment with iridium 192 as the high-dose-rate source. The patient will receive approximately 5.5 Gy to the clinical target volume.  PLAN OF CARE: Transferred to radiation oncology for planning and treatment  PATIENT DISPOSITION:  PACU - hemodynamically stable.   Delay start of Pharmacological VTE agent (>24hrs) due to surgical blood loss or risk of bleeding: not applicable

## 2015-11-09 NOTE — Progress Notes (Signed)
Received report from [place], [place] in PACU.  Patient has LR infusing to gravity through her left hand.  She has a foley catheter draining yellow urine.  Transported with Nicki Reaper, Transporter via stretcher to [facility] room.

## 2015-11-09 NOTE — Progress Notes (Signed)
  Radiation Oncology         [phone] ________________________________  Name: Lindsey Hughes MRN: [id number]  Date: 11/09/2015  DOB: [birth year]  CC: Antonieta Iba, MD  HDR BRACHYTHERAPY NOTE  DIAGNOSIS:    ICD-9-CM  ICD-10-CM    1.  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 Identity was confirmed. All relevant records and images related to the planned course of therapy were reviewed. The patient freely provided informed written consent to proceed with treatment after reviewing the details related to the planned course of therapy. The consent form was witnessed and verified by the simulation staff. Then, the patient was set-up in a stable reproducible supine position for radiation therapy. The tandem ring system was accessed and fiducial markers were placed within the tandem and ring.   Simple treatment device note: On the operating room the patient had construction of her custom tandem ring system. She will be treated with a 45 tandem/ring system. The patient had placement of a 60 mm tandem. A cervical ring with a large shielding was used for her treatment. A rectal paddle was also part of her custom set up device.  Verification simulation note: An AP and lateral film was obtained through the pelvis area. This was compared to the patient's planning films documenting accurate position of the tandem/ring system for treatment.  High-dose-rate brachytherapy treatment note:  The remote afterloading device was accessed through catheter system and attached to the tandem ring system. Patient then proceeded to undergo her first high-dose-rate treatment directed at the proximal vagina. The patient was prescribed a dose of 5.5 gray to be delivered to the mucosal surface.. Patient was treated with 2 channels using 18 dwell positions. Treatment time was 350.4 seconds. The patient tolerated the procedure  well. After completion of her therapy, a radiation survey was performed documenting return of the iridium source into the [employer] safe. The patient was then transferred to the nursing suite. She then had removal of the rectal paddle followed by the tandem and ring system. The patient tolerated the removal well.  PLAN: The patient will return for her second brachytherapy treatment on 11/16/2015.  ________________________________   Blair Promise, PhD, MD    This document serves as a record of services personally performed by Gery Pray, MD. It was created on his behalf by Lendon Collar, a trained medical scribe. The creation of this record is based on the scribe's personal observations and the provider's statements to them. This document has been checked and approved by the attending provider.

## 2015-11-09 NOTE — Progress Notes (Signed)
Patient reporting her pain is now a 5/10.  Converted IV tubing to pump tubing.  LR now infusing at 125 ml/hr through a #20 left hand IV.  SCD's in place and working.  Family present in room and call light in place.  Will continue to monitor.

## 2015-11-09 NOTE — Op Note (Signed)
OPERATIVE NOTE  PATIENT: Lindsey Hughes DATE: 11/09/15   Preop Diagnosis: Stage IIB cervical cancer, s/p external beam radiation  Postoperative Diagnosis: same  Surgery: ultrasound guided dilation of cervix with placement of smitt sleeve for radiation   Surgeons:  Donaciano Eva, MD Assistant: Dr Gery Pray  Anesthesia: General   Estimated blood loss: <4ml  IVF:  141ml   Urine output: not recorded due to distension of bladder with saline via foley.  Complications: None   Pathology: none  Operative findings: 2cm anterior lip of cervix (bulbous, but not grossly involved by tumor). Posterior lip of cervix flush with vagina but with no visible or palpable tumor. No palpable tumor in parametria.   Procedure: The patient was identified in the preoperative holding area. Informed consent was signed on the chart. Patient was seen history was reviewed and exam was performed.   The patient was then taken to the operating room and placed in the supine position with SCD hose on. General anesthesia was then induced without difficulty. She was then placed in the dorsolithotomy position. The perineum was prepped with Betadine. The vagina was prepped with Betadine.   Timeout was performed the patient, procedure, antibiotic, allergy, and length of procedure.   The patient was then draped after the prep was dried. A foley catheter was placed sterile conditions and was backfilled with 300cc of sterile saline. The foley needed to be upsized to a 20 french catheter due to leakage around the urethra.  The weighted speculum was placed in the posterior vagina. The right angle retractor was placed anteriorally to visualize the cervix.   The uterus was sounded to 7.5 using ultrasound guidance to ensure intracavitary (uterine) location. The cervical os was progressively dilated under direct visualization.  2-0 prolene sutures x 2 were placed on the anterior and posterior lips of the  cervix. These were secured to the smitt sleeve (6cm) which was then directed into the uterine cavity with the uterine sound under direct [place] evaluation to ensure intrauterine placement. The sutures were tied down to secure the sleeve in place.  All instrument, suture, laparotomy, Ray-Tec, and needle counts were correct. The procedure continued with Dr Sondra Come placing the implant. This is Everitt Amber dictating an operative note on Marsha Cullins.  Donaciano Eva, MD

## 2015-11-09 NOTE — Anesthesia Procedure Notes (Signed)
Procedure Name: LMA Insertion Date/Time: 11/09/2015 7:23 AM Performed by: Denna Haggard D Pre-anesthesia Checklist: Patient identified, Emergency Drugs available, Suction available and Patient being monitored Patient Re-evaluated:Patient Re-evaluated prior to inductionOxygen Delivery Method: Circle system utilized Preoxygenation: Pre-oxygenation with 100% oxygen Intubation Type: IV induction Ventilation: Mask ventilation without difficulty LMA: LMA inserted LMA Size: 4.0 Number of attempts: 1 Airway Equipment and Method: Bite block Placement Confirmation: positive ETCO2 Tube secured with: Tape Dental Injury: Teeth and Oropharynx as per pre-operative assessment

## 2015-11-09 NOTE — Anesthesia Postprocedure Evaluation (Signed)
Anesthesia Post Note  Patient: Lindsey Hughes  Procedure(s) Performed: Procedure(s) (LRB): TANDEM RING INSERTION (N/A)  Patient location during evaluation: PACU Anesthesia Type: General Level of consciousness: awake and alert Pain management: pain level controlled Vital Signs Assessment: post-procedure vital signs reviewed and stable Respiratory status: spontaneous breathing, nonlabored ventilation, respiratory function stable and patient connected to nasal cannula oxygen Cardiovascular status: blood pressure returned to baseline and stable Postop Assessment: no signs of nausea or vomiting Anesthetic complications: no    Last Vitals:  Filed Vitals:   11/09/15 0845 11/09/15 0900  BP: 125/68 125/71  Pulse: 92 97  Temp:    Resp: 8 11    Last Pain:  Filed Vitals:   11/09/15 0914  PainSc: [place] [facility] Turk

## 2015-11-09 NOTE — Progress Notes (Signed)
IMMEDIATELY FOLLOWING SURGERY: Do not drive or operate machinery for the first twenty four hours after surgery. Do not make any important decisions for twenty four hours after surgery or while taking narcotic pain medications or sedatives. If you develop intractable nausea and vomiting or a severe headache please notify your doctor immediately.   FOLLOW-UP: You do not need to follow up with anesthesia unless specifically instructed to do so.   WOUND CARE INSTRUCTIONS (if applicable): Expect some mild vaginal bleeding, but if large amount of bleeding occurs please contact Dr. Sondra Come at [phone] or the Radiation On-Call physician. Call for any fever greater than 101.0 degrees or increasing vaginal//abdominal pain or trouble urinating.   QUESTIONS?: Please feel free to call your physician or the hospital operator if you have any questions, and they will be happy to assist you. Resume all medications: as listed on your after visit summary. Your next appointment is:  Future Appointments Date Time Provider [place]  11/10/2015 8:00 AM [facility] [id number] [facility] None  11/10/2015 4:00 PM [facility] [id number] [facility] None  11/11/2015 8:00 AM [facility] [id number] [facility] None  11/12/2015 8:00 AM [facility] [id number] [facility] None  11/15/2015 8:00 AM [facility] [id number] [facility] None  11/15/2015 11:45 AM [facility] LAB 4 [facility] None  11/15/2015 12:00 PM [facility] FLUSH NURSE 2 [facility] None  11/15/2015 1:00 PM [facility] E14 [facility] None  11/16/2015 6:30 AM [facility] 1 [facility] [place]  11/16/2015 9:00 AM Gery Pray, MD [facility] None  11/16/2015 1:00 PM [facility] HDR SUITE [facility] None  11/17/2015 8:00 AM [facility] [id number] [facility] None  11/18/2015 8:00 AM [facility] [id number] [facility] None  11/23/2015 6:30 AM [facility] 1 [facility] [place]  11/23/2015 9:00 AM Gery Pray, MD [facility] None  11/23/2015 1:00 PM [facility] HDR SUITE [facility] None   11/30/2015 6:30 AM [facility]-US 1 [facility]US [place]  11/30/2015 10:00 AM Gery Pray, MD [facility] None  11/30/2015 1:00 PM [facility] HDR SUITE [facility] None  12/06/2015 8:15 AM [facility] LAB 3 [facility] None  12/06/2015 8:30 AM [facility] FLUSH NURSE [facility] None  12/06/2015 9:00 AM Lennis P Livesay, MD [facility] None  12/07/2015 6:30 AM [facility] 1 [facility]-US Time  12/07/2015 9:00 AM Gery Pray, MD [facility] None  12/07/2015 1:00 PM [facility] HDR SUITE [facility] None

## 2015-11-09 NOTE — Progress Notes (Signed)
Gave patient discharge paperwork and insturctions. She left the clinic with her husband.

## 2015-11-09 NOTE — Progress Notes (Signed)
Removed foley catheter intact.  Patient tolerated well. 

## 2015-11-09 NOTE — Progress Notes (Signed)
Patient reports pain at a 6/10 in her vaginal area.  0.5 mg dialudid given IV per Dr. Sondra Come.

## 2015-11-09 NOTE — Transfer of Care (Signed)
Immediate Anesthesia Transfer of Care Note  Patient: Lindsey Hughes  Procedure(s) Performed: Procedure(s) (LRB): TANDEM RING INSERTION (N/A)  Patient Location: PACU  Anesthesia Type: General  Level of Consciousness: awake, oriented, sedated and patient cooperative  Airway & Oxygen Therapy: Patient Spontanous Breathing and Patient connected to face mask oxygen  Post-op Assessment: Report given to PACU RN and Post -op Vital signs reviewed and stable  Post vital signs: Reviewed and stable  Complications: No apparent anesthesia complications Last Vitals:  Filed Vitals:   11/09/15 0622 11/09/15 0823  BP: 115/74 136/76  Pulse: 86   Temp: 36.6 C 36.8 C  Resp: 16 15

## 2015-11-09 NOTE — Progress Notes (Signed)
  Radiation Oncology         [phone] ________________________________  Name: Lindsey Hughes MRN: [id number]  Date: 11/09/2015  DOB: 23-May-1970  SIMULATION AND TREATMENT PLANNING NOTE HDR BRACHYTHERAPY    ICD-9-CM ICD-10-CM   1. 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  NARRATIVE:  The patient was brought to the [facility].  Identity was confirmed.  All relevant records and images related to the planned course of therapy were reviewed.  The patient freely provided informed written consent to proceed with treatment after reviewing the details related to the planned course of therapy. The consent form was witnessed and verified by the simulation staff.  Then, the patient was set-up in a stable reproducible  supine position for radiation therapy.  CT images were obtained.  Surface markings were placed.  The CT images were loaded into the planning software.  Then the target and avoidance structures were contoured.  Treatment planning then occurred.  The radiation prescription was entered and confirmed.   I have requested : Brachytherapy Isodose Plan and Dosimetry Calculations to plan the radiation distribution.    PLAN:  The patient will receive 5.5 Gy in 1 fraction directed at the [place] as the high-dose-rate source.    ________________________________  Blair Promise, PhD, MD    This document serves as a record of services personally performed by Gery Pray, MD. It was created on his behalf by Lendon Collar, a trained medical scribe. The creation of this record is based on the scribe's personal observations and the provider's statements to them. This document has been checked and approved by the attending provider.

## 2015-11-10 ENCOUNTER — Encounter (HOSPITAL_BASED_OUTPATIENT_CLINIC_OR_DEPARTMENT_OTHER): Payer: Self-pay | Admitting: Radiation Oncology

## 2015-11-10 ENCOUNTER — Ambulatory Visit: Payer: BLUE CROSS/BLUE SHIELD

## 2015-11-10 ENCOUNTER — Ambulatory Visit
Admission: RE | Admit: 2015-11-10 | Discharge: 2015-11-10 | Disposition: A | Discharge status: Home / Self Care | Payer: BLUE CROSS/BLUE SHIELD | Source: Ambulatory Visit | Attending: Radiation Oncology | Admitting: Radiation Oncology

## 2015-11-10 ENCOUNTER — Telehealth: Payer: Self-pay

## 2015-11-10 ENCOUNTER — Other Ambulatory Visit: Payer: Self-pay | Admitting: Oncology

## 2015-11-10 DIAGNOSIS — R300 Dysuria: Secondary | ICD-10-CM

## 2015-11-10 DIAGNOSIS — N390 Urinary tract infection, site not specified: Secondary | ICD-10-CM

## 2015-11-10 LAB — URINE CULTURE

## 2015-11-10 MED ORDER — CIPROFLOXACIN HCL 250 MG PO TABS: 250.0000 mg | ORAL_TABLET | Freq: Two times a day (BID) | ORAL | Status: DC

## 2015-11-10 NOTE — Telephone Encounter (Signed)
-----   Message from Gordy Levan, MD sent at 11/10/2015  2:43 PM EDT ----- cipro 250 mg bid x 5 days Will recheck UA C&S on 7-26 coordinating with RT that day. RN please watch for that result. POF and order placed now

## 2015-11-10 NOTE — Telephone Encounter (Signed)
LM for Ms Giuliano regarding results of Urine culture from 11-08-15 showing infection. ATB called in as noted below. Scheduling should be in touch to have repeat urine done in lab on 11-17-15 around her radiation appointment that day.

## 2015-11-11 ENCOUNTER — Ambulatory Visit
Admission: RE | Admit: 2015-11-11 | Discharge: 2015-11-11 | Disposition: A | Discharge status: Home / Self Care | Payer: BLUE CROSS/BLUE SHIELD | Source: Ambulatory Visit | Attending: Radiation Oncology | Admitting: Radiation Oncology

## 2015-11-11 ENCOUNTER — Ambulatory Visit: Payer: BLUE CROSS/BLUE SHIELD

## 2015-11-11 ENCOUNTER — Ambulatory Visit
Admit: 2015-11-11 | Discharge: 2015-11-11 | Disposition: A | Discharge status: Home / Self Care | Payer: BLUE CROSS/BLUE SHIELD | Attending: Radiation Oncology | Admitting: Radiation Oncology

## 2015-11-11 ENCOUNTER — Encounter (HOSPITAL_BASED_OUTPATIENT_CLINIC_OR_DEPARTMENT_OTHER): Payer: Self-pay | Admitting: *Deleted

## 2015-11-11 DIAGNOSIS — Z510 Encounter for antineoplastic radiation therapy: Secondary | ICD-10-CM | POA: Diagnosis not present

## 2015-11-11 DIAGNOSIS — C531 Malignant neoplasm of exocervix: Secondary | ICD-10-CM

## 2015-11-11 NOTE — Progress Notes (Signed)
NPO AFTER MN.  ARRIVE AT 0600.  CURRENT LAB RESULTS IN CHART AND EPIC.  WILL TAKE PROTONIX AM DOS W/ SIPS OF WATER AND IF NEEDED TAKE PAIN/ NAUSEA RX.

## 2015-11-11 NOTE — Progress Notes (Signed)
  Radiation Oncology         [phone] ________________________________  Name: Lindsey Hughes MRN: [id number]  Date: 11/11/2015  DOB: 1970-09-17    Weekly Radiation Therapy Management    ICD-9-CM ICD-10-CM   1. 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 . . . . . . . The patient presents for routine under treatment assessment.                                   She has done well since her first brachytherapy treatment. She denies any  vaginal bleeding at this time. She denies any pain at this time. Her dysuria has also improved. She is scheduled for additional chemotherapy next Monday                                 Set-up films were reviewed.                                 The chart was checked. Physical Findings. . . Weight essentially stable.  No significant changes. Alert and in no acute distress. The lungs are clear. The heart has a regular rhythm and rate your the abdomen is soft and nontender with normal bowel sounds.  Impression . . . . . . . The patient is tolerating radiation. Plan . . . . . . . . . . . . Continue treatment as planned. Second brachytherapy procedure is scheduled for July 25  ________________________________   Blair Promise, PhD, MD    This document serves as a record of services personally performed by Gery Pray, MD. It was created on his behalf by Lendon Collar, a trained medical scribe. The creation of this record is based on the scribe's personal observations and the provider's statements to them. This document has been checked and approved by the attending provider.

## 2015-11-12 ENCOUNTER — Ambulatory Visit: Payer: BLUE CROSS/BLUE SHIELD

## 2015-11-15 ENCOUNTER — Ambulatory Visit
Admission: RE | Admit: 2015-11-15 | Discharge: 2015-11-15 | Disposition: A | Discharge status: Home / Self Care | Payer: BLUE CROSS/BLUE SHIELD | Source: Ambulatory Visit | Attending: Radiation Oncology | Admitting: Radiation Oncology

## 2015-11-15 ENCOUNTER — Ambulatory Visit (HOSPITAL_BASED_OUTPATIENT_CLINIC_OR_DEPARTMENT_OTHER): Payer: BLUE CROSS/BLUE SHIELD

## 2015-11-15 ENCOUNTER — Other Ambulatory Visit (HOSPITAL_BASED_OUTPATIENT_CLINIC_OR_DEPARTMENT_OTHER): Payer: BLUE CROSS/BLUE SHIELD

## 2015-11-15 ENCOUNTER — Other Ambulatory Visit: Payer: BLUE CROSS/BLUE SHIELD

## 2015-11-15 ENCOUNTER — Encounter: Payer: Self-pay | Admitting: Oncology

## 2015-11-15 ENCOUNTER — Ambulatory Visit: Payer: BLUE CROSS/BLUE SHIELD

## 2015-11-15 ENCOUNTER — Encounter: Payer: Self-pay | Admitting: Radiation Oncology

## 2015-11-15 VITALS — BP 101/84 | HR 90 | Temp 98.2°F | Ht 62.0 in | Wt 207.6 lb

## 2015-11-15 VITALS — BP 110/80 | HR 87 | Temp 98.4°F | Resp 16

## 2015-11-15 DIAGNOSIS — C531 Malignant neoplasm of exocervix: Secondary | ICD-10-CM

## 2015-11-15 DIAGNOSIS — C530 Malignant neoplasm of endocervix: Secondary | ICD-10-CM

## 2015-11-15 DIAGNOSIS — Z452 Encounter for adjustment and management of vascular access device: Secondary | ICD-10-CM | POA: Diagnosis not present

## 2015-11-15 DIAGNOSIS — Z510 Encounter for antineoplastic radiation therapy: Secondary | ICD-10-CM | POA: Diagnosis not present

## 2015-11-15 LAB — COMPREHENSIVE METABOLIC PANEL
ALBUMIN: 3.4 g/dL — AB (ref 3.5–5.0)
ALK PHOS: 67 U/L (ref 40–150)
ALT: 17 U/L (ref 0–55)
ANION GAP: 11 meq/L (ref 3–11)
AST: 14 U/L (ref 5–34)
BUN: 9.3 mg/dL (ref 7.0–26.0)
CALCIUM: 9.4 mg/dL (ref 8.4–10.4)
CHLORIDE: 103 meq/L (ref 98–109)
CO2: 26 mEq/L (ref 22–29)
CREATININE: 0.9 mg/dL (ref 0.6–1.1)
EGFR: 78 mL/min/{1.73_m2} — ABNORMAL LOW (ref >90)
Glucose: 142 mg/dl — ABNORMAL HIGH (ref 70–140)
POTASSIUM: 4.4 meq/L (ref 3.5–5.1)
Sodium: 140 mEq/L (ref 136–145)
Total Bilirubin: 0.4 mg/dL (ref 0.20–1.20)
Total Protein: 6.7 g/dL (ref 6.4–8.3)

## 2015-11-15 LAB — CBC WITH DIFFERENTIAL/PLATELET
BASO%: 0 % (ref 0.0–2.0)
BASOS ABS: 0 10*3/uL (ref 0.0–0.1)
EOS ABS: 0 10*3/uL (ref 0.0–0.5)
EOS%: 0.9 % (ref 0.0–7.0)
HEMATOCRIT: 29.6 % — AB (ref 34.8–46.6)
HGB: 10.1 g/dL — ABNORMAL LOW (ref 11.6–15.9)
LYMPH#: 0.2 10*3/uL — AB (ref 0.9–3.3)
LYMPH%: 10.7 % — ABNORMAL LOW (ref 14.0–49.7)
MCH: 29.7 pg (ref 25.1–34.0)
MCHC: 34.1 g/dL (ref 31.5–36.0)
MCV: 87.1 fL (ref 79.5–101.0)
MONO#: 0.2 10*3/uL (ref 0.1–0.9)
MONO%: 7.1 % (ref 0.0–14.0)
NEUT#: 1.8 10*3/uL (ref 1.5–6.5)
NEUT%: 81.3 % — AB (ref 38.4–76.8)
PLATELETS: 125 10*3/uL — AB (ref 145–400)
RBC: 3.4 10*6/uL — ABNORMAL LOW (ref 3.70–5.45)
RDW: 16.1 % — ABNORMAL HIGH (ref 11.2–14.5)
WBC: 2.3 10*3/uL — ABNORMAL LOW (ref 3.9–10.3)

## 2015-11-15 LAB — MAGNESIUM: MAGNESIUM: 1.5 mg/dL (ref 1.5–2.5)

## 2015-11-15 MED ORDER — SODIUM CHLORIDE 0.9 % IV SOLN
Freq: Once | INTRAVENOUS | Status: AC
  Administered 2015-11-15: 13:00:00 via INTRAVENOUS

## 2015-11-15 MED ORDER — LORAZEPAM 2 MG/ML IJ SOLN
1.0000 mg | Freq: Once | INTRAMUSCULAR | Status: AC | PRN
  Administered 2015-11-15: 1 mg via INTRAVENOUS

## 2015-11-15 MED ORDER — SODIUM CHLORIDE 0.9 % IV SOLN
Freq: Once | INTRAVENOUS | Status: AC
  Administered 2015-11-15: 13:00:00 via INTRAVENOUS
  Filled 2015-11-15: qty 5

## 2015-11-15 MED ORDER — SODIUM CHLORIDE 0.9 % IV SOLN
40.0000 mg/m2 | Freq: Once | INTRAVENOUS | Status: AC
  Administered 2015-11-15: 81 mg via INTRAVENOUS
  Filled 2015-11-15: qty 81

## 2015-11-15 MED ORDER — PROCHLORPERAZINE EDISYLATE 5 MG/ML IJ SOLN
10.0000 mg | Freq: Once | INTRAMUSCULAR | Status: AC | PRN
  Administered 2015-11-15: 10 mg via INTRAVENOUS

## 2015-11-15 MED ORDER — SODIUM CHLORIDE 0.9% FLUSH
10.0000 mL | INTRAVENOUS | Status: DC | PRN
  Administered 2015-11-15: 10 mL
  Filled 2015-11-15: qty 10

## 2015-11-15 MED ORDER — PROCHLORPERAZINE MALEATE 10 MG PO TABS
ORAL_TABLET | ORAL | Status: AC
  Filled 2015-11-15: qty 1

## 2015-11-15 MED ORDER — PROCHLORPERAZINE EDISYLATE 5 MG/ML IJ SOLN
INTRAMUSCULAR | Status: AC
  Filled 2015-11-15: qty 2

## 2015-11-15 MED ORDER — LORAZEPAM 2 MG/ML IJ SOLN
INTRAMUSCULAR | Status: AC
  Filled 2015-11-15: qty 1

## 2015-11-15 MED ORDER — DIPHENOXYLATE-ATROPINE 2.5-0.025 MG PO TABS: 2.0000 | ORAL_TABLET | Freq: Four times a day (QID) | ORAL | 0 refills | Status: DC | PRN

## 2015-11-15 MED ORDER — HEPARIN SOD (PORK) LOCK FLUSH 100 UNIT/ML IV SOLN
500.0000 [IU] | Freq: Once | INTRAVENOUS | Status: AC | PRN
  Administered 2015-11-15: 500 [IU]
  Filled 2015-11-15: qty 5

## 2015-11-15 MED ORDER — POTASSIUM CHLORIDE 2 MEQ/ML IV SOLN
Freq: Once | INTRAVENOUS | Status: AC
  Administered 2015-11-15: 11:00:00 via INTRAVENOUS
  Filled 2015-11-15: qty 10

## 2015-11-15 NOTE — Progress Notes (Signed)
  Radiation Oncology         [phone] ________________________________  Name: Lindsey Hughes MRN: [id number]  Date: 11/15/2015  DOB: 11/04/1970    Weekly Radiation Therapy Management       ICD-9-CM ICD-10-CM   1. 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 . . . . . . . The patient presents for routine under treatment assessment.                                   Lindsey Hughes has completed 27 fractions to her pelvis.  She reports pain due to dysuria at a 1/10.  She is currently taking Cipro for a UTI.  She reports having diarrhea every time she urinates.  She stopped imodium because it was not helping.  She reports she had some vaginal spotting Tuesday/Wednesday which has quit.  She reports having a good appetite and energy level.  She reports having some rectal irritation.  Recommending using Desitin as needed. She had some nausea and vomiting after treatment last week.                                 Set-up films were reviewed.                                 The chart was checked. Physical Findings. . .  Vitals:   11/15/15 0832  BP: 101/84  Pulse: 90  Temp: 98.2 F (36.8 C)   Weight essentially stable. No significant changes. Alert and in no acute distress. The heart has a regular rhythm and rate. The lungs are clear to auscultation bilaterally.  Impression . . . . . . . The patient is tolerating radiation. Plan . . . . . . . . . . . . Continue treatment as planned. Second brachytherapy procedure is scheduled for tomorrow. She is getting blood work today and is planned to receive chemotherapy. She's had some continued diarrhea and I prescribed her Lomotil.   ________________________________   Blair Promise, PhD, MD    This document serves as a record of services personally performed by Gery Pray, MD. It was created on his behalf by Lendon Collar, a trained medical scribe. The creation of  this record is based on the scribe's personal observations and the provider's statements to them. This document has been checked and approved by the attending provider.

## 2015-11-15 NOTE — Anesthesia Preprocedure Evaluation (Addendum)
Anesthesia Evaluation  Patient identified by MRN, date of birth, ID band Patient awake    Reviewed: Allergy & Precautions, NPO status , Patient's Chart, lab work & pertinent test results  Airway Mallampati: III  TM Distance: >3 FB Neck ROM: Full    Dental no notable dental hx. (+) Teeth Intact, Partial Upper   Pulmonary neg pulmonary ROS,    Pulmonary exam normal breath sounds clear to auscultation       Cardiovascular + Peripheral Vascular Disease  Normal cardiovascular exam Rhythm:Regular Rate:Normal  Indwelling portacath   Neuro/Psych  Headaches, PSYCHIATRIC DISORDERS Depression    GI/Hepatic Neg liver ROS, GERD  Medicated and Controlled,  Endo/Other  Hypothyroidism Obesity Hyperglycemia  Renal/GU negative Renal ROS  negative genitourinary   Musculoskeletal negative musculoskeletal ROS (+)   Abdominal (+) + obese,   Peds  Hematology  (+) anemia , Thrombocytopenia Leukopenia ChemoRx- yesterday   Anesthesia Other Findings   Reproductive/Obstetrics Exocervix Cervical Ca                           Anesthesia Physical Anesthesia Plan  ASA: II  Anesthesia Plan: General   Post-op Pain Management:    Induction: Intravenous  Airway Management Planned: LMA and Oral ETT  Additional Equipment:   Intra-op Plan:   Post-operative Plan: Extubation in OR  Informed Consent: I have reviewed the patients History and Physical, chart, labs and discussed the procedure including the risks, benefits and alternatives for the proposed anesthesia with the patient or authorized representative who has indicated his/her understanding and acceptance.   Dental advisory given  Plan Discussed with: Anesthesiologist, CRNA and Surgeon  Anesthesia Plan Comments:         Anesthesia Quick Evaluation

## 2015-11-15 NOTE — Patient Instructions (Signed)
[facility] Discharge Instructions for Patients Receiving Chemotherapy  Today you received the following chemotherapy agents Cisplatin To help prevent nausea and vomiting after your treatment, we encourage you to take your nausea medication as prescribed.    If you develop nausea and vomiting that is not controlled by your nausea medication, call the clinic.   BELOW ARE SYMPTOMS THAT SHOULD BE REPORTED IMMEDIATELY:  *FEVER GREATER THAN 100.5 F  *CHILLS WITH OR WITHOUT FEVER  NAUSEA AND VOMITING THAT IS NOT CONTROLLED WITH YOUR NAUSEA MEDICATION  *UNUSUAL SHORTNESS OF BREATH  *UNUSUAL BRUISING OR BLEEDING  TENDERNESS IN MOUTH AND THROAT WITH OR WITHOUT PRESENCE OF ULCERS  *URINARY PROBLEMS  *BOWEL PROBLEMS  UNUSUAL RASH Items with * indicate a potential emergency and should be followed up as soon as possible.  Feel free to call the clinic you have any questions or concerns. The clinic phone number is [phone].  Please show the CHEMO ALERT CARD at check-in to the Emergency Department and triage nurse.   

## 2015-11-15 NOTE — Progress Notes (Unsigned)
Patient came in office with financial concerns with personal bills. Introduced myself as her Estate manager/land agent and explained the [employer]. Patient will bring proof of spouse's income to determine eligibility. Patient not working and has no source of income for herself. Household of 3 and has a daughter that has become ill. Emailed Adonis Huguenin to also get patient additional grant through [employer]. Patient will bring documentation to proceed with grants. Patient has my card for any additional financial questions or concerns. Also advised patient to contact insurance company to find out where she stands as far as OOP/deductible. This will determine if she will need additional assistance such as copay assistance for treatment. Advised her that Meredith/Cindy are the advocates downstairs for Radiation related concerns.

## 2015-11-15 NOTE — Progress Notes (Signed)
Lindsey Hughes has completed 27 fractions to her pelvis.  She reports pain due to dysuria at a 1/10.  She is currently taking Cipro for a UTI.  She reports having diarrhea every time she urinates.  She stopped imodium because it was not helping.  She reports she had some vaginal spotting Tuesday/Wednesday which has quit.  She reports having a good appetite and energy level.  She reports having some rectal irritation.  Recommending using Desitin as needed.  BP 101/84 (BP Location: Left Arm, Patient Position: Sitting)   Pulse 90   Temp 98.2 F (36.8 C) (Oral)   Ht 5\' 2"  (1.575 m)   Wt 207 lb 9.6 oz (94.2 kg)   SpO2 100%   BMI 37.97 kg/m    Wt Readings from Last 3 Encounters:  11/15/15 207 lb 9.6 oz (94.2 kg)  11/09/15 205 lb (93 kg)  11/02/15 207 lb (93.9 kg)

## 2015-11-15 NOTE — Telephone Encounter (Addendum)
POF from 11-10-15 actually requests 11-18-15 for reapeat lab for urine specimen not 11-17-15 as noted in Dr. Mariana Kaufman note below.  Appointment set up for 11-18-15 at [place] after radiation treatment.

## 2015-11-16 ENCOUNTER — Ambulatory Visit: Payer: BLUE CROSS/BLUE SHIELD

## 2015-11-16 ENCOUNTER — Ambulatory Visit (HOSPITAL_COMMUNITY)
Admission: RE | Admit: 2015-11-16 | Discharge: 2015-11-16 | Disposition: A | Discharge status: Home / Self Care | Payer: BLUE CROSS/BLUE SHIELD | Source: Ambulatory Visit | Attending: Radiation Oncology | Admitting: Radiation Oncology

## 2015-11-16 ENCOUNTER — Ambulatory Visit (HOSPITAL_BASED_OUTPATIENT_CLINIC_OR_DEPARTMENT_OTHER): Payer: BLUE CROSS/BLUE SHIELD | Admitting: Anesthesiology

## 2015-11-16 ENCOUNTER — Ambulatory Visit (HOSPITAL_BASED_OUTPATIENT_CLINIC_OR_DEPARTMENT_OTHER)
Admission: RE | Admit: 2015-11-16 | Discharge: 2015-11-16 | Disposition: A | Discharge status: Home / Self Care | Payer: BLUE CROSS/BLUE SHIELD | Source: Ambulatory Visit | Attending: Radiation Oncology | Admitting: Radiation Oncology

## 2015-11-16 ENCOUNTER — Ambulatory Visit: Admission: RE | Admit: 2015-11-16 | Payer: BLUE CROSS/BLUE SHIELD | Source: Ambulatory Visit

## 2015-11-16 ENCOUNTER — Encounter (HOSPITAL_BASED_OUTPATIENT_CLINIC_OR_DEPARTMENT_OTHER): Payer: Self-pay | Admitting: *Deleted

## 2015-11-16 ENCOUNTER — Ambulatory Visit
Admission: RE | Admit: 2015-11-16 | Discharge: 2015-11-16 | Disposition: A | Discharge status: Home / Self Care | Payer: BLUE CROSS/BLUE SHIELD | Source: Ambulatory Visit | Attending: Radiation Oncology | Admitting: Radiation Oncology

## 2015-11-16 ENCOUNTER — Encounter (HOSPITAL_BASED_OUTPATIENT_CLINIC_OR_DEPARTMENT_OTHER)
Admission: RE | Disposition: A | Discharge status: Home / Self Care | Payer: Self-pay | Source: Ambulatory Visit | Attending: Radiation Oncology

## 2015-11-16 ENCOUNTER — Other Ambulatory Visit: Payer: Self-pay | Admitting: Oncology

## 2015-11-16 DIAGNOSIS — Z6837 Body mass index (BMI) 37.0-37.9, adult: Secondary | ICD-10-CM | POA: Diagnosis not present

## 2015-11-16 DIAGNOSIS — K219 Gastro-esophageal reflux disease without esophagitis: Secondary | ICD-10-CM | POA: Diagnosis not present

## 2015-11-16 DIAGNOSIS — C539 Malignant neoplasm of cervix uteri, unspecified: Secondary | ICD-10-CM | POA: Insufficient documentation

## 2015-11-16 DIAGNOSIS — E669 Obesity, unspecified: Secondary | ICD-10-CM | POA: Diagnosis not present

## 2015-11-16 DIAGNOSIS — E039 Hypothyroidism, unspecified: Secondary | ICD-10-CM | POA: Insufficient documentation

## 2015-11-16 DIAGNOSIS — I739 Peripheral vascular disease, unspecified: Secondary | ICD-10-CM | POA: Diagnosis not present

## 2015-11-16 DIAGNOSIS — C531 Malignant neoplasm of exocervix: Secondary | ICD-10-CM

## 2015-11-16 DIAGNOSIS — Z79899 Other long term (current) drug therapy: Secondary | ICD-10-CM | POA: Diagnosis not present

## 2015-11-16 HISTORY — PX: TANDEM RING INSERTION: SHX6199

## 2015-11-16 SURGERY — INSERTION, UTERINE TANDEM AND RING OR CYLINDER, FOR BRACHYTHERAPY
Anesthesia: General | Site: Vagina

## 2015-11-16 MED ORDER — LACTATED RINGERS IV SOLN
INTRAVENOUS | Status: DC
  Administered 2015-11-16: 07:00:00 via INTRAVENOUS
  Filled 2015-11-16: qty 1000

## 2015-11-16 MED ORDER — LIDOCAINE HCL (CARDIAC) 20 MG/ML IV SOLN
INTRAVENOUS | Status: AC
  Filled 2015-11-16: qty 5

## 2015-11-16 MED ORDER — KETOROLAC TROMETHAMINE 30 MG/ML IJ SOLN
INTRAMUSCULAR | Status: AC
  Filled 2015-11-16: qty 1

## 2015-11-16 MED ORDER — KETOROLAC TROMETHAMINE 30 MG/ML IJ SOLN
INTRAMUSCULAR | Status: DC | PRN
  Administered 2015-11-16: 30 mg via INTRAVENOUS

## 2015-11-16 MED ORDER — OXYCODONE HCL 5 MG PO TABS
5.0000 mg | ORAL_TABLET | Freq: Once | ORAL | Status: DC | PRN
  Filled 2015-11-16: qty 1

## 2015-11-16 MED ORDER — MIDAZOLAM HCL 5 MG/5ML IJ SOLN
INTRAMUSCULAR | Status: DC | PRN
  Administered 2015-11-16: 2 mg via INTRAVENOUS

## 2015-11-16 MED ORDER — ONDANSETRON HCL 4 MG/2ML IJ SOLN
INTRAMUSCULAR | Status: DC | PRN
  Administered 2015-11-16: 4 mg via INTRAVENOUS

## 2015-11-16 MED ORDER — OXYCODONE HCL 5 MG/5ML PO SOLN
5.0000 mg | Freq: Once | ORAL | Status: DC | PRN
Start: 2015-11-16 — End: 2015-11-16
  Filled 2015-11-16: qty 5

## 2015-11-16 MED ORDER — SCOPOLAMINE 1 MG/3DAYS TD PT72
MEDICATED_PATCH | TRANSDERMAL | Status: AC
  Filled 2015-11-16: qty 1

## 2015-11-16 MED ORDER — PROPOFOL 10 MG/ML IV BOLUS
INTRAVENOUS | Status: DC | PRN
  Administered 2015-11-16: 200 mg via INTRAVENOUS

## 2015-11-16 MED ORDER — FENTANYL CITRATE (PF) 100 MCG/2ML IJ SOLN
INTRAMUSCULAR | Status: AC
  Filled 2015-11-16: qty 2

## 2015-11-16 MED ORDER — MIDAZOLAM HCL 2 MG/2ML IJ SOLN
INTRAMUSCULAR | Status: AC
  Filled 2015-11-16: qty 2

## 2015-11-16 MED ORDER — WATER FOR IRRIGATION, STERILE IR SOLN
Status: DC | PRN
  Administered 2015-11-16: 3000 mL

## 2015-11-16 MED ORDER — ONDANSETRON HCL 4 MG/2ML IJ SOLN
INTRAMUSCULAR | Status: AC
  Filled 2015-11-16: qty 2

## 2015-11-16 MED ORDER — SCOPOLAMINE 1 MG/3DAYS TD PT72
1.0000 | MEDICATED_PATCH | TRANSDERMAL | Status: DC
  Administered 2015-11-16: 1.5 mg via TRANSDERMAL
  Filled 2015-11-16: qty 1

## 2015-11-16 MED ORDER — DEXAMETHASONE SODIUM PHOSPHATE 10 MG/ML IJ SOLN
INTRAMUSCULAR | Status: AC
  Filled 2015-11-16: qty 1

## 2015-11-16 MED ORDER — HYDROMORPHONE HCL 1 MG/ML IJ SOLN
0.5000 mg | Freq: Once | INTRAMUSCULAR | Status: AC
  Administered 2015-11-16: 0.5 mg via INTRAVENOUS
  Filled 2015-11-16: qty 1

## 2015-11-16 MED ORDER — PROMETHAZINE HCL 25 MG/ML IJ SOLN
12.5000 mg | Freq: Once | INTRAMUSCULAR | Status: DC | PRN
  Filled 2015-11-16: qty 1

## 2015-11-16 MED ORDER — FENTANYL CITRATE (PF) 100 MCG/2ML IJ SOLN
INTRAMUSCULAR | Status: DC | PRN
  Administered 2015-11-16 (×2): 50 ug via INTRAVENOUS

## 2015-11-16 MED ORDER — LIDOCAINE 2% (20 MG/ML) 5 ML SYRINGE
INTRAMUSCULAR | Status: DC | PRN
  Administered 2015-11-16: 100 mg via INTRAVENOUS

## 2015-11-16 MED ORDER — DEXAMETHASONE SODIUM PHOSPHATE 4 MG/ML IJ SOLN
INTRAMUSCULAR | Status: DC | PRN
  Administered 2015-11-16: 10 mg via INTRAVENOUS

## 2015-11-16 MED ORDER — LACTATED RINGERS IV SOLN
INTRAVENOUS | Status: DC
  Administered 2015-11-16: 10:00:00 via INTRAVENOUS
  Filled 2015-11-16 (×2): qty 250

## 2015-11-16 MED ORDER — FENTANYL CITRATE (PF) 100 MCG/2ML IJ SOLN
25.0000 ug | INTRAMUSCULAR | Status: DC | PRN
  Filled 2015-11-16: qty 1

## 2015-11-16 MED ORDER — PROPOFOL 10 MG/ML IV BOLUS
INTRAVENOUS | Status: AC
  Filled 2015-11-16: qty 40

## 2015-11-16 SURGICAL SUPPLY — 40 items
BAG URINE DRAINAGE (UROLOGICAL SUPPLIES) ×6 IMPLANT
BNDG CONFORM 2 STRL LF (GAUZE/BANDAGES/DRESSINGS) IMPLANT
CATH FOLEY 2WAY SLVR  5CC 16FR (CATHETERS)
CATH FOLEY 2WAY SLVR  5CC 20FR (CATHETERS) ×2
CATH FOLEY 2WAY SLVR 5CC 16FR (CATHETERS) IMPLANT
CATH FOLEY 2WAY SLVR 5CC 20FR (CATHETERS) ×1 IMPLANT
COVER BACK TABLE 60X90IN (DRAPES) ×3 IMPLANT
DRAPE LG THREE QUARTER DISP (DRAPES) ×3 IMPLANT
DRAPE UNDERBUTTOCKS STRL (DRAPE) ×3 IMPLANT
GLOVE BIO SURGEON STRL SZ 6.5 (GLOVE) ×2 IMPLANT
GLOVE BIO SURGEON STRL SZ7.5 (GLOVE) ×6 IMPLANT
GLOVE BIO SURGEONS STRL SZ 6.5 (GLOVE) ×1
GLOVE BIOGEL PI IND STRL 6.5 (GLOVE) ×1 IMPLANT
GLOVE BIOGEL PI INDICATOR 6.5 (GLOVE) ×2
GOWN STRL REUS W/ TWL LRG LVL3 (GOWN DISPOSABLE) ×2 IMPLANT
GOWN STRL REUS W/TWL LRG LVL3 (GOWN DISPOSABLE) ×4
HOLDER FOLEY CATH W/STRAP (MISCELLANEOUS) ×3 IMPLANT
KIT ROOM TURNOVER WOR (KITS) ×3 IMPLANT
LEGGING LITHOTOMY PAIR STRL (DRAPES) ×3 IMPLANT
MANIFOLD NEPTUNE II (INSTRUMENTS) IMPLANT
NEEDLE SPNL 22GX3.5 QUINCKE BK (NEEDLE) IMPLANT
PACK BASIN DAY SURGERY FS (CUSTOM PROCEDURE TRAY) ×3 IMPLANT
PACKING VAGINAL (PACKING) IMPLANT
PAD ABD 8X10 STRL (GAUZE/BANDAGES/DRESSINGS) ×3 IMPLANT
PAD OB MATERNITY 4.3X12.25 (PERSONAL CARE ITEMS) IMPLANT
PAD PREP 24X48 CUFFED NSTRL (MISCELLANEOUS) ×3 IMPLANT
PLUG CATH AND CAP STER (CATHETERS) IMPLANT
SET IRRIG Y TYPE TUR BLADDER L (SET/KITS/TRAYS/PACK) ×3 IMPLANT
SUT PROLENE 0 SH 30 (SUTURE) IMPLANT
SUT SILK 2 0 30  PSL (SUTURE)
SUT SILK 2 0 30 PSL (SUTURE) IMPLANT
SYR BULB IRRIGATION 50ML (SYRINGE) IMPLANT
SYR CONTROL 10ML LL (SYRINGE) IMPLANT
SYRINGE 10CC LL (SYRINGE) ×3 IMPLANT
TOWEL OR 17X24 6PK STRL BLUE (TOWEL DISPOSABLE) ×6 IMPLANT
TRAY DSU PREP LF (CUSTOM PROCEDURE TRAY) ×3 IMPLANT
TUBE CONNECTING 12'X1/4 (SUCTIONS)
TUBE CONNECTING 12X1/4 (SUCTIONS) IMPLANT
WATER STERILE IRR 3000ML UROMA (IV SOLUTION) ×6 IMPLANT
WATER STERILE IRR 500ML POUR (IV SOLUTION) ×3 IMPLANT

## 2015-11-16 NOTE — Brief Op Note (Signed)
11/16/2015  8:27 AM  PATIENT:  Lindsey Hughes  45 y.o. female  PRE-OPERATIVE DIAGNOSIS:  EXOCERVIX  POST-OPERATIVE DIAGNOSIS:  EXOCERVIX  PROCEDURE:  Procedure(s): TANDEM RING INSERTION (N/A)  SURGEON:  Surgeon(s) and Role:    * Gery Pray, MD - Primary  PHYSICIAN ASSISTANT:   ASSISTANTS: none   ANESTHESIA:   general  EBL:  Total I/O In: 450 [I.V.:450] Out: 0   BLOOD ADMINISTERED:none  DRAINS: Urinary Catheter (Foley)   LOCAL MEDICATIONS USED:  NONE  SPECIMEN:  No Specimen  DISPOSITION OF SPECIMEN:  N/A  COUNTS:  YES  TOURNIQUET:  * No tourniquets in log *  DICTATION: The patient was taken to outpatient OR #1. Timeout was performed before the procedure was initiated.  The perineum and vaginal cavity was prepped by OR staff. A 16 French Foley catheter was initially placed however there was noted to be urine leakage around the catheter and therefore patient had a 20 French Foley catheter placed during the procedure. Approximately 20 mL of sterile water was placed in the Foley balloon. On exam under anesthesia the patient was noted to have excellent response to her external beam and radiosensitizing chemotherapy. The cervical mass was actually noted to be along the anterior lip of the cervix and had decreased in size from approximately 6 cm down to 2 cm on clinical exam. There is no further parametrial extension noted. . . Patient then had initiation of intraoperative ultrasound. Approximately 200 mL of sterile water was placed within the bladder for imaging purposes The uterus was noted to be in an anteverted position. The cervical sleeve was noted to be sewn in good position along the cervical os. Patient then had placement of a 60 mm, 45 tandem within the cervical and endometrial cavity within the cervical sleeve. Excellent placement was noted on intraoperative ultrasound. Patient then had placement of a 45 ring with small shielding cap In place. Patient then had placement  of a rectal paddle to shield the anterior rectum from high-dose radiation. Intraoperative ultrasound confirmed good placement of tandem and ring. Patient was subsequently transported to the recovery room in stable condition. Later in the day the patient will undergo planning and her second high-dose-rate treatment with iridium 192 as the high-dose-rate source. The patient will receive approximately 5.5 Gy to the clinical target volume.  PLAN OF CARE: Transferred to radiation oncology for planning and treatment  PATIENT DISPOSITION:  PACU - hemodynamically stable.   Delay start of Pharmacological VTE agent (>24hrs) due to surgical blood loss or risk of bleeding: not applicable

## 2015-11-16 NOTE — Progress Notes (Signed)
  Radiation Oncology         [phone] ________________________________  Name: Lindsey Hughes MRN: [id number]  Date: 11/16/2015  DOB: [birth year]  CC: Antonieta Iba, MD  HDR BRACHYTHERAPY NOTE  DIAGNOSIS:    ICD-9-CM  ICD-10-CM    1.  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 Identity was confirmed. All relevant records and images related to the planned course of therapy were reviewed. The patient freely provided informed written consent to proceed with treatment after reviewing the details related to the planned course of therapy. The consent form was witnessed and verified by the simulation staff. Then, the patient was set-up in a stable reproducible supine position for radiation therapy. The tandem ring system was accessed and fiducial markers were placed within the tandem and ring.   Simple treatment device note: On the operating room the patient had construction of her custom tandem ring system. She will be treated with a 45 tandem/ring system. The patient had placement of a 60 mm tandem. A cervical ring with a large shielding was used for her treatment. A rectal paddle was also part of her custom set up device.  Verification simulation note: An AP and lateral film was obtained through the pelvis area. This was compared to the patient's planning films documenting accurate position of the tandem/ring system for treatment.  High-dose-rate brachytherapy treatment note:  The remote afterloading device was accessed through catheter system and attached to the tandem ring system. Patient then proceeded to undergo her second high-dose-rate treatment directed at the proximal vagina. The patient was prescribed a dose of 5.5 gray to be delivered to the mucosal surface. Patient was treated with 2 channels using 18 dwell positions. Treatment time was 367.6 seconds. The patient tolerated the procedure  well. After completion of her therapy, a radiation survey was performed documenting return of the iridium source into the [employer] safe. The patient was then transferred to the nursing suite. She then had removal of the rectal paddle followed by the tandem and ring system. The patient tolerated the removal well.  PLAN: The patient will return for her third brachytherapy treatment on 11/23/15.  ________________________________   Blair Promise, PhD, MD  This document serves as a record of services personally performed by Gery Pray, MD. It was created on his behalf by Darcus Austin, a trained medical scribe. The creation of this record is based on the scribe's personal observations and the provider's statements to them. This document has been checked and approved by the attending provider.

## 2015-11-16 NOTE — Progress Notes (Signed)
IMMEDIATELY FOLLOWING SURGERY: Do not drive or operate machinery for the first twenty four hours after surgery. Do not make any important decisions for twenty four hours after surgery or while taking narcotic pain medications or sedatives. If you develop intractable nausea and vomiting or a severe headache please notify your doctor immediately.   FOLLOW-UP: You do not need to follow up with anesthesia unless specifically instructed to do so.   WOUND CARE INSTRUCTIONS (if applicable): Expect some mild vaginal bleeding, but if large amount of bleeding occurs please contact Dr. Sondra Come at [phone] or the Radiation On-Call physician. Call for any fever greater than 101.0 degrees or increasing vaginal//abdominal pain or trouble urinating.   QUESTIONS?: Please feel free to call your physician or the hospital operator if you have any questions, and they will be happy to assist you. Resume all medications: as listed on your after visit summary. Your next appointment is:  Future Appointments Date Time Provider [place]  11/17/2015 7:45 AM [facility] [id number] [facility] None  11/18/2015 8:00 AM [facility] [id number] [facility] None  11/18/2015 8:45 AM [facility] LAB 6 [facility] None  11/19/2015 8:00 AM [facility] [id number] [facility] None  11/23/2015 6:30 AM [facility] 1 [facility]US [place]  11/23/2015 9:00 AM Gery Pray, MD [facility] None  11/23/2015 1:00 PM [facility] HDR SUITE [facility] None  11/30/2015 6:30 AM [facility]-US 1 [facility]-US [place]  11/30/2015 10:00 AM Gery Pray, MD [facility] None  11/30/2015 1:00 PM [facility] HDR SUITE [facility] None  12/06/2015 8:15 AM [facility] LAB 3 [facility] None  12/06/2015 8:30 AM [facility] FLUSH NURSE [facility] None  12/06/2015 9:00 AM Lennis P Livesay, MD [facility] None  12/07/2015 6:30 AM [facility]-US 1 WL-US [place]  12/07/2015 9:00 AM Gery Pray, MD [facility] None  12/07/2015 1:00 PM [facility] HDR SUITE [facility] None

## 2015-11-16 NOTE — Transfer of Care (Signed)
Immediate Anesthesia Transfer of Care Note  Patient: Lindsey Hughes  Procedure(s) Performed: Procedure(s): TANDEM RING INSERTION (N/A)  Patient Location: PACU  Anesthesia Type:General  Level of Consciousness: awake, alert  and oriented  Airway & Oxygen Therapy: Patient Spontanous Breathing and Patient connected to nasal cannula oxygen  Post-op Assessment: Report given to RN  Post vital signs: Reviewed and stable  Last Vitals: 128/78, 103, 24, 100% Vitals:   11/16/15 0608  BP: 120/78  Pulse: 87  Resp: 16  Temp: 36.6 C    Last Pain:  Vitals:   11/16/15 0608  TempSrc: Oral      Patients Stated Pain Goal: 7 ([id number])  Complications: No apparent anesthesia complications

## 2015-11-16 NOTE — H&P (View-Only) (Signed)
Radiation Oncology         [phone] ________________________________  History and physical examination  Name: Lindsey Hughes MRN: [id number]  Date: 10/06/2015  DOB: Feb 08, 1971  [id number], Anderson Malta L, PA-C  No ref. 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   HISTORY OF PRESENT ILLNESS::Lindsey Hughes is a 45 y.o. 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 Patient is currently undergoing external beam radiation therapy along with radiosensitizing chemotherapy. Her pelvic and low back pain has improved with her treatments as well as her vaginal bleeding. Patient is now taken to the operating room for her first brachytherapy procedure using iridium 192 with the tandem ring system Dr. Denman George will be present for exam under anesthesia and  placement of cervical sleeve for multiple tandem ring insertions.   PAST MEDICAL HISTORY:  has a past medical history of Hypothyroidism; Anemia; Depression; Headache; Radiation; GERD (gastroesophageal reflux disease); Chemotherapy-induced nausea; Chemotherapy induced diarrhea; Port-a-cath in place; History of thrombophlebitis (RESOLVED-- but per pt prefer no bp or  puncture done in right arm); and Cervical cancer [facility]) (oncologist-  dr livesay/  dr Jazmina Muhlenkamp/  dr Denman George).    PAST SURGICAL HISTORY: Past Surgical History  Procedure Laterality Date  . Cesarean section  1991   . Carpal tunnel release Bilateral 2002 and 2009  . Lasik Left 2015  . Dilation and curettage of uterus N/A 09/03/2015    Procedure: EXAM UNDER ANESTHESIA  WITH FROZEN SECTION CERVICAL BIOPSY & ENDOCERVICAL CURRETTINGS;  Surgeon: Aloha Gell, MD;  Location: [facility];  Service: Gynecology;  Laterality: N/A;  . Tubal ligation  1996  . Portacath placement  10-11-2015  . Echo stress test  12-10-2014    normal w/ no evidence for  stress-induced ischemia/  normal LV function and wall motion , ef 65-70%    FAMILY HISTORY: family history includes Brain cancer in her father; CAD in her brother and mother; Lung cancer in her father and mother.  SOCIAL HISTORY:  reports that she has never smoked. She has never used smokeless tobacco. She reports that she does not drink alcohol or use illicit drugs.  ALLERGIES: Zofran  MEDICATIONS:  No current facility-administered medications for this encounter.   Current Outpatient Prescriptions  Medication Sig Dispense Refill  . acetaminophen (TYLENOL) 500 MG tablet Take 500 mg by mouth every 6 (six) hours as needed. Reported on 11/02/2015    . ALPRAZolam (XANAX) 0.25 MG tablet Take 0.25 mg by mouth as needed for anxiety. Reported on 10/21/2015    . ibuprofen (ADVIL,MOTRIN) 800 MG tablet Take 800 mg by mouth every 8 (eight) hours as needed for mild pain or moderate pain. Reported on 11/02/2015  4  . lidocaine-prilocaine (EMLA) cream Apply  1-2 hrs prior to Portacath access prn. 30 g 1  . loperamide (IMODIUM) 1 MG/5ML solution Take by mouth as needed for diarrhea or loose stools. Reported on 10/29/2015    . LORazepam (ATIVAN) 1 MG tablet Place 1/2 -1 tabled under the tongue or swallow every 6 hrs as needed for nausea.  Will make drowsy. 20 tablet 0  . magic mouthwash SOLN Take 5-10 mLs by mouth every 4 (four) hours as needed for mouth pain. 1:1 ratio 2% viscous lidocaine;benadryl, and nystatin 480 mL 1  . oxyCODONE-acetaminophen (ROXICET) 5-325 MG tablet Take 1 tablet by mouth every 4 (four) hours as needed for severe pain. Oxycodone/percocet/roxicet,  30 tablets, no refills 12 tablet 0  . pantoprazole (PROTONIX) 40 MG tablet Take 1 tablet (40 mg total) by mouth daily. (Patient taking differently: Take 40 mg by mouth every morning. ) 30 tablet 2  . prochlorperazine (COMPAZINE) 10 MG tablet Take 1 tablet (10 mg total) by mouth every 6 (six) hours as needed for nausea or vomiting. 30 tablet 1  .  triamcinolone (KENALOG) 0.1 % paste Apply to mouth ulers twice a day. (Patient taking differently: as needed. Apply to mouth ulers twice a day.) 5 g 1  . levothyroxine (SYNTHROID, LEVOTHROID) 50 MCG tablet Take 50 mcg by mouth daily before breakfast. Reported on 11/02/2015--  Pt per last dose 09-24-2015  approx.     Facility-Administered Medications Ordered in Other Encounters  Medication Dose Route Frequency Provider Last Rate Last Dose  . famotidine (PEPCID) IVPB 20 mg premix  20 mg Intravenous Q12H Lennis P Livesay, MD   20 mg at 10/07/15 1328    REVIEW OF SYSTEMS:  A 15 point review of systems is documented in the electronic medical record. This was obtained by the nursing staff. However, I reviewed this with the patient to discuss relevant findings and make appropriate changes. Patient has had some nausea with her Radiation and chemotherapy. She has also had some diarrhea with her treatment. Dysuria is also one of her side effects    PHYSICAL EXAM:  height is 5\' 2"  (1.575 m) and weight is 207 lb 6.4 oz (94.076 kg). Her oral temperature is 98.2 F (36.8 C). Her blood pressure is 126/84 and her pulse is 81. Her respiration is 16 and oxygen saturation is 100%.  General: Alert and oriented, in no acute distress HEENT: Head is normocephalic. Extraocular movements are intact. Oropharynx is clear. Teeth in good repair Neck: Neck is supple, no palpable cervical or supraclavicular lymphadenopathy. Heart: Regular in rate and rhythm with no murmurs, rubs, or gallops. Chest: Clear to auscultation bilaterally, with no rhonchi, wheezes, or rales. Abdomen: Soft, nontender, nondistended, with no rigidity or guarding. Extremities: No cyanosis or edema. Lymphatics: see Neck Exam Skin: No concerning lesions. Musculoskeletal: symmetric strength and muscle tone throughout. Neurologic: Cranial nerves II through XII are grossly intact. No obvious focalities. Speech is fluent. Coordination is  intact. Psychiatric: Judgment and insight are intact. Affect is appropriate. Pelvic exam deferred until operating room procedure   LABORATORY DATA:  Lab Results  Component Value Date   WBC 2.0* 11/08/2015   HGB 10.1* 11/08/2015   HCT 29.5* 11/08/2015   MCV 84.5 11/08/2015   PLT 100* 11/08/2015   NEUTROABS 1.4* 11/08/2015   Lab Results  Component Value Date   NA 139 11/08/2015   K 3.6 11/08/2015   CL 104 10/11/2015   CO2 25 11/08/2015   GLUCOSE 113 11/08/2015   CREATININE 0.8 11/08/2015   CALCIUM 9.0 11/08/2015      RADIOGRAPHY: Ir Fluoro Guide Cv Line Right  10/11/2015  INDICATION: 45 year old with cervical cancer. Port-A-Cath needed for chemotherapy. EXAM: FLUOROSCOPIC AND ULTRASOUND GUIDED PLACEMENT OF A SUBCUTANEOUS PORT COMPARISON:  None. MEDICATIONS: Ancef 2 g; The antibiotic was administered within an appropriate time interval prior to skin puncture. ANESTHESIA/SEDATION: Versed 3.0 mg IV; Fentanyl 150 mcg IV; Moderate Sedation Time:  35 minutes The patient was continuously monitored during the procedure by the interventional radiology nurse under my direct supervision. FLUOROSCOPY TIME:  24 seconds (4 mGy) COMPLICATIONS: None immediate. PROCEDURE: The procedure, risks, benefits, and alternatives were explained to the patient. Questions regarding the procedure  were encouraged and answered. The patient understands and consents to the procedure. Patient was placed supine on the interventional table. Ultrasound confirmed a patent right internal jugular vein. The right chest and neck were cleaned with a skin antiseptic and a sterile drape was placed. Maximal barrier sterile technique was utilized including caps, mask, sterile gowns, sterile gloves, sterile drape, hand hygiene and skin antiseptic. The right neck was anesthetized with 1% lidocaine. Small incision was made in the right neck with a blade. Micropuncture set was placed in the right internal jugular vein with ultrasound  guidance. The micropuncture wire was used for measurement purposes. The right chest was anesthetized with 1% lidocaine with epinephrine. #15 blade was used to make an incision and a subcutaneous port pocket was formed. [place] was assembled. Subcutaneous tunnel was formed with a stiff tunneling device. The port catheter was brought through the subcutaneous tunnel. The port was placed in the subcutaneous pocket. The micropuncture set was exchanged for a peel-away sheath. The catheter was placed through the peel-away sheath and the tip was positioned at the superior cavoatrial junction. Catheter placement was confirmed with fluoroscopy. The port was accessed and flushed with heparinized saline. The port pocket was closed using two layers of absorbable sutures and Dermabond. The vein skin site was closed using a single layer of absorbable suture and Dermabond. Sterile dressings were applied. Patient tolerated the procedure well without an immediate complication. Ultrasound and fluoroscopic images were taken and saved for this procedure. IMPRESSION: Placement of a subcutaneous port device. Catheter tip at the superior cavoatrial junction. Electronically Signed   By: Markus Daft M.D.   On: 10/11/2015 14:52   Ir US Guide Vasc Access Right  10/11/2015  INDICATION: 45 year old with cervical cancer. Port-A-Cath needed for chemotherapy. EXAM: FLUOROSCOPIC AND ULTRASOUND GUIDED PLACEMENT OF A SUBCUTANEOUS PORT COMPARISON:  None. MEDICATIONS: Ancef 2 g; The antibiotic was administered within an appropriate time interval prior to skin puncture. ANESTHESIA/SEDATION: Versed 3.0 mg IV; Fentanyl 150 mcg IV; Moderate Sedation Time:  35 minutes The patient was continuously monitored during the procedure by the interventional radiology nurse under my direct supervision. FLUOROSCOPY TIME:  24 seconds (4 mGy) COMPLICATIONS: None immediate. PROCEDURE: The procedure, risks, benefits, and alternatives were explained to the  patient. Questions regarding the procedure were encouraged and answered. The patient understands and consents to the procedure. Patient was placed supine on the interventional table. Ultrasound confirmed a patent right internal jugular vein. The right chest and neck were cleaned with a skin antiseptic and a sterile drape was placed. Maximal barrier sterile technique was utilized including caps, mask, sterile gowns, sterile gloves, sterile drape, hand hygiene and skin antiseptic. The right neck was anesthetized with 1% lidocaine. Small incision was made in the right neck with a blade. Micropuncture set was placed in the right internal jugular vein with ultrasound guidance. The micropuncture wire was used for measurement purposes. The right chest was anesthetized with 1% lidocaine with epinephrine. #15 blade was used to make an incision and a subcutaneous port pocket was formed. [place] was assembled. Subcutaneous tunnel was formed with a stiff tunneling device. The port catheter was brought through the subcutaneous tunnel. The port was placed in the subcutaneous pocket. The micropuncture set was exchanged for a peel-away sheath. The catheter was placed through the peel-away sheath and the tip was positioned at the superior cavoatrial junction. Catheter placement was confirmed with fluoroscopy. The port was accessed and flushed with heparinized saline. The port  pocket was closed using two layers of absorbable sutures and Dermabond. The vein skin site was closed using a single layer of absorbable suture and Dermabond. Sterile dressings were applied. Patient tolerated the procedure well without an immediate complication. Ultrasound and fluoroscopic images were taken and saved for this procedure. IMPRESSION: Placement of a subcutaneous port device. Catheter tip at the superior cavoatrial junction. Electronically Signed   By: Markus Daft M.D.   On: 10/11/2015 14:52      IMPRESSION: Stage II-B squamous cell  carcinoma cervix. The patient is now ready to proceed with brachytherapy as part of her overall management.  PLAN: Patient will proceed to the operating room for scheduled case on July 18 at 7:15 AM.    ------------------------------------------------  Blair Promise, PhD, MD

## 2015-11-16 NOTE — Progress Notes (Signed)
Received report from [place], RN in PACU.  Patient has LR infusing to gravity through a #20 left hand IV.  She has a foley catheter draining yellow urine.  She denies having any pain.  Transported to CT SIM via stretcher with Nicki Reaper, Transporter.

## 2015-11-16 NOTE — Progress Notes (Signed)
Removed foley catheter intact.  Patient tolerated well. 

## 2015-11-16 NOTE — Progress Notes (Signed)
Patient back from CT SIM.  IV tubing converted to pump tubing.  She now has LR infusing at 125 ml/hr through her left hand IV.  She continues to deny having pain.  Call light in reach and family present in the room.  Will continue to monitor.

## 2015-11-16 NOTE — Progress Notes (Signed)
Removed IV intact.  Gave patient discharge instructions and paperwork.

## 2015-11-16 NOTE — Interval H&P Note (Signed)
History and Physical Interval Note:  11/16/2015 7:28 AM  Lindsey Hughes  has presented today for surgery, with the diagnosis of EXOCERVIX  The various methods of treatment have been discussed with the patient and family. After consideration of risks, benefits and other options for treatment, the patient has consented to  Procedure(s): TANDEM RING INSERTION (N/A) as a surgical intervention .  The patient's history has been reviewed, patient examined, no change in status, stable for surgery.  I have reviewed the patient's chart and labs.  Questions were answered to the patient's satisfaction.     Gery Pray

## 2015-11-16 NOTE — Anesthesia Procedure Notes (Signed)
Procedure Name: LMA Insertion Date/Time: 11/16/2015 7:33 AM Performed by: Bethena Roys T Pre-anesthesia Checklist: Patient identified, Emergency Drugs available, Suction available and Patient being monitored Patient Re-evaluated:Patient Re-evaluated prior to inductionOxygen Delivery Method: Circle system utilized Preoxygenation: Pre-oxygenation with 100% oxygen Intubation Type: IV induction Ventilation: Mask ventilation without difficulty LMA: LMA inserted LMA Size: 4.0 Number of attempts: 1 Airway Equipment and Method: Bite block Placement Confirmation: positive ETCO2 Tube secured with: Tape Dental Injury: Teeth and Oropharynx as per pre-operative assessment

## 2015-11-16 NOTE — Progress Notes (Signed)
  Radiation Oncology         [phone] ________________________________  Name: Emmett Goldwasser MRN: [id number]  Date: 11/16/2015  DOB: [birth year]  SIMULATION AND TREATMENT PLANNING NOTE HDR BRACHYTHERAPY  DIAGNOSIS: Stage IIB poorly differentiated squamous cell carcinoma of the cervix  NARRATIVE:  The patient was brought to the [place] suite.  Identity was confirmed.  All relevant records and images related to the planned course of therapy were reviewed.  The patient freely provided informed written consent to proceed with treatment after reviewing the details related to the planned course of therapy. The consent form was witnessed and verified by the simulation staff.  Then, the patient was set-up in a stable reproducible  supine position for radiation therapy.  CT images were obtained.  Surface markings were placed.  The CT images were loaded into the planning software.  Then the target and avoidance structures were contoured.  Treatment planning then occurred.  The radiation prescription was entered and confirmed.   I have requested : Brachytherapy Isodose Plan and Dosimetry Calculations to plan the radiation distribution.    PLAN:  The patient will receive 5.5 Gy in 1 fraction. Iridium 192 will be the high-dose-rate source.  ________________________________  Blair Promise, PhD, MD  This document serves as a record of services personally performed by Gery Pray, MD. It was created on his behalf by Darcus Austin, a trained medical scribe. The creation of this record is based on the scribe's personal observations and the provider's statements to them. This document has been checked and approved by the attending provider.

## 2015-11-16 NOTE — Anesthesia Postprocedure Evaluation (Signed)
Anesthesia Post Note  Patient: Lindsey Hughes  Procedure(s) Performed: Procedure(s) (LRB): TANDEM RING INSERTION (N/A)  Patient location during evaluation: PACU Anesthesia Type: General Level of consciousness: awake and alert and oriented Pain management: pain level controlled Vital Signs Assessment: post-procedure vital signs reviewed and stable Respiratory status: spontaneous breathing, nonlabored ventilation and respiratory function stable Cardiovascular status: blood pressure returned to baseline and stable Postop Assessment: no signs of nausea or vomiting Anesthetic complications: no    Last Vitals:  Vitals:   11/16/15 0815 11/16/15 0830  BP: 119/73 127/75  Pulse: 95 81  Resp: 17 18  Temp:      Last Pain:  Vitals:   11/16/15 0608  TempSrc: Oral                 Karesha Trzcinski A.

## 2015-11-16 NOTE — Progress Notes (Signed)
Patient given 0.5 mg dilaudid IV per Dr. Sondra Come for premedication for tandem equipment removal.  Will continue to monitor.

## 2015-11-16 NOTE — Discharge Instructions (Signed)

## 2015-11-17 ENCOUNTER — Encounter (HOSPITAL_BASED_OUTPATIENT_CLINIC_OR_DEPARTMENT_OTHER): Payer: Self-pay | Admitting: Radiation Oncology

## 2015-11-17 ENCOUNTER — Ambulatory Visit
Admission: RE | Admit: 2015-11-17 | Discharge: 2015-11-17 | Disposition: A | Discharge status: Home / Self Care | Payer: BLUE CROSS/BLUE SHIELD | Source: Ambulatory Visit | Attending: Radiation Oncology | Admitting: Radiation Oncology

## 2015-11-17 DIAGNOSIS — Z510 Encounter for antineoplastic radiation therapy: Secondary | ICD-10-CM | POA: Diagnosis not present

## 2015-11-18 ENCOUNTER — Other Ambulatory Visit: Payer: BLUE CROSS/BLUE SHIELD

## 2015-11-18 ENCOUNTER — Ambulatory Visit
Admission: RE | Admit: 2015-11-18 | Discharge: 2015-11-18 | Disposition: A | Discharge status: Home / Self Care | Payer: BLUE CROSS/BLUE SHIELD | Source: Ambulatory Visit | Attending: Radiation Oncology | Admitting: Radiation Oncology

## 2015-11-18 ENCOUNTER — Telehealth: Payer: Self-pay

## 2015-11-18 ENCOUNTER — Ambulatory Visit: Payer: BLUE CROSS/BLUE SHIELD

## 2015-11-18 DIAGNOSIS — Z510 Encounter for antineoplastic radiation therapy: Secondary | ICD-10-CM | POA: Diagnosis not present

## 2015-11-18 NOTE — Telephone Encounter (Signed)
LVM to ask pt if she has any sx of UTI. This is to f/u after 5 day round of cipro. Was supposed to have lab appt today for repeat UA after XRT which is not in her chart.

## 2015-11-18 NOTE — Telephone Encounter (Signed)
S/w [name], she is still with some burning, urgency, frequency with urination. She is taking high dose XRT. She is wanting to go ahead with repeat UA. appt made for 0830 11/19/15. Pt aware of appt time.

## 2015-11-19 ENCOUNTER — Ambulatory Visit: Payer: BLUE CROSS/BLUE SHIELD

## 2015-11-19 ENCOUNTER — Encounter (HOSPITAL_BASED_OUTPATIENT_CLINIC_OR_DEPARTMENT_OTHER): Payer: Self-pay | Admitting: *Deleted

## 2015-11-19 ENCOUNTER — Telehealth: Payer: Self-pay

## 2015-11-19 ENCOUNTER — Ambulatory Visit
Admission: RE | Admit: 2015-11-19 | Discharge: 2015-11-19 | Disposition: A | Discharge status: Home / Self Care | Payer: BLUE CROSS/BLUE SHIELD | Source: Ambulatory Visit | Attending: Radiation Oncology | Admitting: Radiation Oncology

## 2015-11-19 ENCOUNTER — Ambulatory Visit (HOSPITAL_BASED_OUTPATIENT_CLINIC_OR_DEPARTMENT_OTHER): Payer: BLUE CROSS/BLUE SHIELD

## 2015-11-19 DIAGNOSIS — N390 Urinary tract infection, site not specified: Secondary | ICD-10-CM

## 2015-11-19 DIAGNOSIS — R300 Dysuria: Secondary | ICD-10-CM

## 2015-11-19 DIAGNOSIS — C530 Malignant neoplasm of endocervix: Secondary | ICD-10-CM | POA: Diagnosis not present

## 2015-11-19 DIAGNOSIS — Z510 Encounter for antineoplastic radiation therapy: Secondary | ICD-10-CM | POA: Diagnosis not present

## 2015-11-19 LAB — URINALYSIS, MICROSCOPIC - CHCC
BILIRUBIN (URINE): NEGATIVE
Glucose: NEGATIVE mg/dL
Ketones: NEGATIVE mg/dL
Nitrite: NEGATIVE
PH: 6 (ref 4.6–8.0)
Protein: <30 mg/dL
SPECIFIC GRAVITY, URINE: 1.03 (ref 1.003–1.035)
Urobilinogen, UR: 0.2 mg/dL (ref 0.2–1)

## 2015-11-19 MED ORDER — CIPROFLOXACIN HCL 250 MG PO TABS: 250.0000 mg | ORAL_TABLET | Freq: Two times a day (BID) | ORAL | 0 refills | Status: DC

## 2015-11-19 NOTE — Progress Notes (Signed)
NPO AFTER MN.  ARRIVE AT 0600.  CURRENT LAB RESULTS IN CHART AND EPIC.  WILL TAKE PROTONIX AM DOS W/ SIPS OF WATER.

## 2015-11-19 NOTE — Telephone Encounter (Signed)
Told Lindsey Hughes that her urinalysis looks as though she still has a UTI.  Dr. Marko Plume ordered another 5 days of Cripro 250 mg bid X 5 days.  Prescription sent to her pharmacy.   Will follow up urine culture until final.

## 2015-11-21 LAB — URINE CULTURE

## 2015-11-21 NOTE — Anesthesia Preprocedure Evaluation (Addendum)
Anesthesia Evaluation  Patient identified by MRN, date of birth, ID band Patient awake    Reviewed: Allergy & Precautions, NPO status , Patient's Chart, lab work & pertinent test results  History of Anesthesia Complications (+) PONV and history of anesthetic complications  Airway Mallampati: II  TM Distance: >3 FB Neck ROM: Full    Dental no notable dental hx.    Pulmonary neg pulmonary ROS,    Pulmonary exam normal breath sounds clear to auscultation       Cardiovascular + Peripheral Vascular Disease  Normal cardiovascular exam Rhythm:Regular Rate:Normal     Neuro/Psych  Headaches, PSYCHIATRIC DISORDERS Anxiety Depression    GI/Hepatic Neg liver ROS, GERD  Medicated and Controlled,  Endo/Other  Hypothyroidism obesity  Renal/GU negative Renal ROS  negative genitourinary   Musculoskeletal negative musculoskeletal ROS (+)   Abdominal   Peds negative pediatric ROS (+)  Hematology  (+) anemia ,   Anesthesia Other Findings Cervical cancer  Reproductive/Obstetrics negative OB ROS                            Anesthesia Physical Anesthesia Plan  ASA: II  Anesthesia Plan: General   Post-op Pain Management:    Induction: Intravenous  Airway Management Planned: LMA  Additional Equipment:   Intra-op Plan:   Post-operative Plan: Extubation in OR  Informed Consent: I have reviewed the patients History and Physical, chart, labs and discussed the procedure including the risks, benefits and alternatives for the proposed anesthesia with the patient or authorized representative who has indicated his/her understanding and acceptance.   Dental advisory given  Plan Discussed with: CRNA  Anesthesia Plan Comments:         Anesthesia Quick Evaluation

## 2015-11-22 ENCOUNTER — Ambulatory Visit: Payer: BLUE CROSS/BLUE SHIELD

## 2015-11-22 ENCOUNTER — Telehealth: Payer: Self-pay | Admitting: Oncology

## 2015-11-22 ENCOUNTER — Other Ambulatory Visit: Payer: Self-pay | Admitting: Oncology

## 2015-11-22 DIAGNOSIS — A499 Bacterial infection, unspecified: Secondary | ICD-10-CM

## 2015-11-22 DIAGNOSIS — N390 Urinary tract infection, site not specified: Principal | ICD-10-CM

## 2015-11-22 MED ORDER — AMOXICILLIN 500 MG PO TABS
500.0000 mg | ORAL_TABLET | Freq: Two times a day (BID) | ORAL | 0 refills | Status: DC
Start: 2015-11-22 — End: 2015-11-30

## 2015-11-22 NOTE — Telephone Encounter (Signed)
LM in [place] cell phone voice mail [phone] today that her urine culture grew out an organism that was not sensitive to cipr. She needs to stop the cipro and begin Amoxicillin 500 mg q 12 hrs for 5 days.  Prescription sent to her pharmacy. Dr. Marko Plume wants to repeat a urinalysis and culture on 11-30-15 around the radiation appointment.  The schedulers should call to let her know time of appointment for lab.

## 2015-11-22 NOTE — Telephone Encounter (Signed)
Lindsey Hughes called and asked if there is any "numbing cream" for her rectal area.  She said she has been using Desitin cream and taking warm baths.  Advised her that is what we recommend using and that we can address it tomorrow with Dr. Sondra Come.

## 2015-11-23 ENCOUNTER — Ambulatory Visit (HOSPITAL_BASED_OUTPATIENT_CLINIC_OR_DEPARTMENT_OTHER): Payer: BLUE CROSS/BLUE SHIELD | Admitting: Anesthesiology

## 2015-11-23 ENCOUNTER — Encounter (HOSPITAL_BASED_OUTPATIENT_CLINIC_OR_DEPARTMENT_OTHER): Payer: Self-pay | Admitting: *Deleted

## 2015-11-23 ENCOUNTER — Ambulatory Visit
Admission: RE | Admit: 2015-11-23 | Discharge: 2015-11-23 | Disposition: A | Discharge status: Home / Self Care | Payer: BLUE CROSS/BLUE SHIELD | Source: Ambulatory Visit | Attending: Radiation Oncology | Admitting: Radiation Oncology

## 2015-11-23 ENCOUNTER — Ambulatory Visit (HOSPITAL_COMMUNITY)
Admission: RE | Admit: 2015-11-23 | Discharge: 2015-11-23 | Disposition: A | Discharge status: Home / Self Care | Payer: BLUE CROSS/BLUE SHIELD | Source: Ambulatory Visit | Attending: Radiation Oncology | Admitting: Radiation Oncology

## 2015-11-23 ENCOUNTER — Ambulatory Visit (HOSPITAL_BASED_OUTPATIENT_CLINIC_OR_DEPARTMENT_OTHER)
Admission: RE | Admit: 2015-11-23 | Discharge: 2015-11-23 | Disposition: A | Discharge status: Home / Self Care | Payer: BLUE CROSS/BLUE SHIELD | Source: Ambulatory Visit | Attending: Radiation Oncology | Admitting: Radiation Oncology

## 2015-11-23 ENCOUNTER — Telehealth: Payer: Self-pay | Admitting: Oncology

## 2015-11-23 ENCOUNTER — Encounter (HOSPITAL_BASED_OUTPATIENT_CLINIC_OR_DEPARTMENT_OTHER)
Admission: RE | Disposition: A | Discharge status: Home / Self Care | Payer: Self-pay | Source: Ambulatory Visit | Attending: Radiation Oncology

## 2015-11-23 VITALS — BP 114/74 | HR 70 | Temp 98.3°F

## 2015-11-23 DIAGNOSIS — K219 Gastro-esophageal reflux disease without esophagitis: Secondary | ICD-10-CM | POA: Diagnosis not present

## 2015-11-23 DIAGNOSIS — C539 Malignant neoplasm of cervix uteri, unspecified: Secondary | ICD-10-CM

## 2015-11-23 DIAGNOSIS — I739 Peripheral vascular disease, unspecified: Secondary | ICD-10-CM | POA: Insufficient documentation

## 2015-11-23 DIAGNOSIS — Z9221 Personal history of antineoplastic chemotherapy: Secondary | ICD-10-CM | POA: Diagnosis not present

## 2015-11-23 DIAGNOSIS — E669 Obesity, unspecified: Secondary | ICD-10-CM | POA: Diagnosis not present

## 2015-11-23 DIAGNOSIS — F419 Anxiety disorder, unspecified: Secondary | ICD-10-CM | POA: Insufficient documentation

## 2015-11-23 DIAGNOSIS — F329 Major depressive disorder, single episode, unspecified: Secondary | ICD-10-CM | POA: Insufficient documentation

## 2015-11-23 DIAGNOSIS — Z6836 Body mass index (BMI) 36.0-36.9, adult: Secondary | ICD-10-CM | POA: Insufficient documentation

## 2015-11-23 DIAGNOSIS — E039 Hypothyroidism, unspecified: Secondary | ICD-10-CM | POA: Diagnosis not present

## 2015-11-23 DIAGNOSIS — Z79899 Other long term (current) drug therapy: Secondary | ICD-10-CM | POA: Diagnosis not present

## 2015-11-23 DIAGNOSIS — C531 Malignant neoplasm of exocervix: Secondary | ICD-10-CM

## 2015-11-23 HISTORY — DX: Dysuria: R30.0

## 2015-11-23 HISTORY — PX: TANDEM RING INSERTION: SHX6199

## 2015-11-23 SURGERY — INSERTION, UTERINE TANDEM AND RING OR CYLINDER, FOR BRACHYTHERAPY
Anesthesia: General

## 2015-11-23 MED ORDER — DEXAMETHASONE SODIUM PHOSPHATE 10 MG/ML IJ SOLN
INTRAMUSCULAR | Status: AC
  Filled 2015-11-23: qty 1

## 2015-11-23 MED ORDER — LORAZEPAM 0.5 MG PO TABS
0.5000 mg | ORAL_TABLET | Freq: Once | ORAL | Status: AC
  Administered 2015-11-23: 0.5 mg via SUBLINGUAL
  Filled 2015-11-23: qty 1

## 2015-11-23 MED ORDER — ESTRADIOL 0.1 MG/GM VA CREA
TOPICAL_CREAM | VAGINAL | Status: DC | PRN
  Administered 2015-11-23: 1 via VAGINAL

## 2015-11-23 MED ORDER — PROMETHAZINE HCL 25 MG/ML IJ SOLN
6.2500 mg | INTRAMUSCULAR | Status: DC | PRN
  Filled 2015-11-23: qty 1

## 2015-11-23 MED ORDER — LIDOCAINE 2% (20 MG/ML) 5 ML SYRINGE
INTRAMUSCULAR | Status: DC | PRN
  Administered 2015-11-23: 60 mg via INTRAVENOUS

## 2015-11-23 MED ORDER — SONAFINE EX EMUL
1.0000 "application " | Freq: Once | CUTANEOUS | Status: AC
  Administered 2015-11-23: 1 via TOPICAL

## 2015-11-23 MED ORDER — HYDROMORPHONE HCL 1 MG/ML IJ SOLN
0.5000 mg | Freq: Once | INTRAMUSCULAR | Status: AC
  Administered 2015-11-23: 0.5 mg via INTRAVENOUS
  Filled 2015-11-23: qty 1

## 2015-11-23 MED ORDER — SCOPOLAMINE 1 MG/3DAYS TD PT72
1.0000 | MEDICATED_PATCH | TRANSDERMAL | Status: DC
  Administered 2015-11-23: 1.5 mg via TRANSDERMAL
  Filled 2015-11-23: qty 1

## 2015-11-23 MED ORDER — DEXAMETHASONE SODIUM PHOSPHATE 4 MG/ML IJ SOLN
INTRAMUSCULAR | Status: DC | PRN
  Administered 2015-11-23: 10 mg via INTRAVENOUS

## 2015-11-23 MED ORDER — FENTANYL CITRATE (PF) 100 MCG/2ML IJ SOLN
INTRAMUSCULAR | Status: AC
  Filled 2015-11-23: qty 2

## 2015-11-23 MED ORDER — FENTANYL CITRATE (PF) 100 MCG/2ML IJ SOLN
INTRAMUSCULAR | Status: DC | PRN
  Administered 2015-11-23: 50 ug via INTRAVENOUS
  Administered 2015-11-23 (×2): 25 ug via INTRAVENOUS

## 2015-11-23 MED ORDER — LIDOCAINE HCL (CARDIAC) 20 MG/ML IV SOLN
INTRAVENOUS | Status: AC
  Filled 2015-11-23: qty 5

## 2015-11-23 MED ORDER — PROPOFOL 500 MG/50ML IV EMUL
INTRAVENOUS | Status: AC
  Filled 2015-11-23: qty 50

## 2015-11-23 MED ORDER — MIDAZOLAM HCL 2 MG/2ML IJ SOLN
INTRAMUSCULAR | Status: AC
  Filled 2015-11-23: qty 2

## 2015-11-23 MED ORDER — WATER FOR IRRIGATION, STERILE IR SOLN
Status: DC | PRN
  Administered 2015-11-23: 500 mL

## 2015-11-23 MED ORDER — SODIUM CHLORIDE 0.9 % IR SOLN
Status: DC | PRN
  Administered 2015-11-23: 1000 mL

## 2015-11-23 MED ORDER — SCOPOLAMINE 1 MG/3DAYS TD PT72
MEDICATED_PATCH | TRANSDERMAL | Status: AC
  Filled 2015-11-23: qty 1

## 2015-11-23 MED ORDER — FENTANYL CITRATE (PF) 100 MCG/2ML IJ SOLN
25.0000 ug | INTRAMUSCULAR | Status: DC | PRN
  Administered 2015-11-23 (×3): 50 ug via INTRAVENOUS
  Filled 2015-11-23: qty 1

## 2015-11-23 MED ORDER — SODIUM CHLORIDE 0.9 % IV SOLN
1.0000 g | Freq: Once | INTRAVENOUS | Status: AC
  Administered 2015-11-23: 1 g via INTRAVENOUS
  Filled 2015-11-23: qty 1000

## 2015-11-23 MED ORDER — LIDOCAINE HCL (CARDIAC) 20 MG/ML IV SOLN: INTRAVENOUS | Status: DC | PRN

## 2015-11-23 MED ORDER — ONDANSETRON HCL 4 MG/2ML IJ SOLN
INTRAMUSCULAR | Status: DC | PRN
  Administered 2015-11-23: 4 mg via INTRAVENOUS

## 2015-11-23 MED ORDER — PROPOFOL 10 MG/ML IV BOLUS
INTRAVENOUS | Status: DC | PRN
  Administered 2015-11-23: 50 mg via INTRAVENOUS
  Administered 2015-11-23: 180 mg via INTRAVENOUS

## 2015-11-23 MED ORDER — MIDAZOLAM HCL 5 MG/5ML IJ SOLN
INTRAMUSCULAR | Status: DC | PRN
  Administered 2015-11-23: 2 mg via INTRAVENOUS

## 2015-11-23 MED ORDER — ONDANSETRON HCL 4 MG/2ML IJ SOLN
INTRAMUSCULAR | Status: AC
  Filled 2015-11-23: qty 2

## 2015-11-23 MED ORDER — HYDROMORPHONE HCL 1 MG/ML IJ SOLN
1.0000 mg | Freq: Once | INTRAMUSCULAR | Status: AC
  Administered 2015-11-23: 1 mg via INTRAVENOUS
  Filled 2015-11-23: qty 1

## 2015-11-23 MED ORDER — PROPOFOL 10 MG/ML IV BOLUS
INTRAVENOUS | Status: AC
  Filled 2015-11-23: qty 20

## 2015-11-23 MED ORDER — AMPICILLIN SODIUM 1 G IJ SOLR
INTRAMUSCULAR | Status: AC
  Filled 2015-11-23: qty 1000

## 2015-11-23 MED ORDER — SODIUM CHLORIDE 0.9 % IV SOLN
INTRAVENOUS | Status: AC
  Filled 2015-11-23: qty 50

## 2015-11-23 MED ORDER — LACTATED RINGERS IV SOLN
INTRAVENOUS | Status: DC
  Administered 2015-11-23: 07:00:00 via INTRAVENOUS
  Filled 2015-11-23 (×2): qty 1000

## 2015-11-23 MED ORDER — LACTATED RINGERS IV SOLN
INTRAVENOUS | Status: DC
  Administered 2015-11-23: 10:00:00 via INTRAVENOUS
  Filled 2015-11-23 (×2): qty 250

## 2015-11-23 MED FILL — HYDROCORTISONE 2.5% CREAM: 2.5 | 15 days supply | Qty: 30 | Fill #0

## 2015-11-23 MED FILL — ANUCORT-HC 25 MG SUPPOSITOR: 25 | 6 days supply | Qty: 12 | Fill #0

## 2015-11-23 SURGICAL SUPPLY — 33 items
BAG URINE DRAINAGE (UROLOGICAL SUPPLIES) ×2 IMPLANT
BNDG CONFORM 2 STRL LF (GAUZE/BANDAGES/DRESSINGS) IMPLANT
COVER BACK TABLE 60X90IN (DRAPES) ×2 IMPLANT
DRAPE LG THREE QUARTER DISP (DRAPES) ×2 IMPLANT
DRAPE UNDERBUTTOCKS STRL (DRAPE) ×2 IMPLANT
GLOVE BIO SURGEON STRL SZ7.5 (GLOVE) ×4 IMPLANT
GOWN STRL REUS W/ TWL LRG LVL3 (GOWN DISPOSABLE) ×2 IMPLANT
GOWN STRL REUS W/TWL LRG LVL3 (GOWN DISPOSABLE) ×2
HOLDER FOLEY CATH W/STRAP (MISCELLANEOUS) ×2 IMPLANT
IV NS 1000ML (IV SOLUTION) ×1
IV NS 1000ML BAXH (IV SOLUTION) ×1 IMPLANT
KIT ROOM TURNOVER WOR (KITS) ×2 IMPLANT
LEGGING LITHOTOMY PAIR STRL (DRAPES) ×2 IMPLANT
NEEDLE SPNL 22GX3.5 QUINCKE BK (NEEDLE) IMPLANT
PACK BASIN DAY SURGERY FS (CUSTOM PROCEDURE TRAY) ×2 IMPLANT
PACKING VAGINAL (PACKING) ×2 IMPLANT
PAD ABD 8X10 STRL (GAUZE/BANDAGES/DRESSINGS) ×2 IMPLANT
PAD OB MATERNITY 4.3X12.25 (PERSONAL CARE ITEMS) IMPLANT
PAD PREP 24X48 CUFFED NSTRL (MISCELLANEOUS) ×2 IMPLANT
PLUG CATH AND CAP STER (CATHETERS) IMPLANT
SET IRRIG Y TYPE TUR BLADDER L (SET/KITS/TRAYS/PACK) ×2 IMPLANT
SUT PROLENE 0 SH 30 (SUTURE) IMPLANT
SUT SILK 2 0 30  PSL (SUTURE)
SUT SILK 2 0 30 PSL (SUTURE) IMPLANT
SYR BULB IRRIGATION 50ML (SYRINGE) IMPLANT
SYR CONTROL 10ML LL (SYRINGE) ×2 IMPLANT
SYRINGE 10CC LL (SYRINGE) ×2 IMPLANT
TOWEL OR 17X24 6PK STRL BLUE (TOWEL DISPOSABLE) ×4 IMPLANT
TRAY DSU PREP LF (CUSTOM PROCEDURE TRAY) ×2 IMPLANT
TUBE CONNECTING 12X1/4 (SUCTIONS) ×2 IMPLANT
WATER STERILE IRR 3000ML UROMA (IV SOLUTION) IMPLANT
WATER STERILE IRR 500ML POUR (IV SOLUTION) ×2 IMPLANT
YANKAUER SUCT BULB TIP NO VENT (SUCTIONS) ×2 IMPLANT

## 2015-11-23 NOTE — Progress Notes (Signed)
Received report from Jackelyn Poling, [place] and Olivia Mackie, RN in PACU.  Patient has foley catheter draining clear yellow urine.  She has a #20 IV in her left hand with LR infusing to gravity.  Patient reports having burning in her vaginal area.  Transported via stretcher to CT SIM.

## 2015-11-23 NOTE — Progress Notes (Signed)
  Radiation Oncology         [phone] ________________________________  Name: Lindsey Hughes MRN: [id number]  Date: 11/23/2015  DOB: 04-17-1971  CC: Antonieta Iba, MD  HDR BRACHYTHERAPY NOTE  DIAGNOSIS:    ICD-9-CM  ICD-10-CM    1.  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 Identity was confirmed. All relevant records and images related to the planned course of therapy were reviewed. The patient freely provided informed written consent to proceed with treatment after reviewing the details related to the planned course of therapy. The consent form was witnessed and verified by the simulation staff. Then, the patient was set-up in a stable reproducible supine position for radiation therapy. The tandem ring system was accessed and fiducial markers were placed within the tandem and ring.   Simple treatment device note: On the operating room the patient had construction of her custom tandem ring system. She will be treated with a 60 tandem/ring system. The patient had placement of a 60 mm tandem. A cervical ring with a small shielding was used for her treatment. A rectal paddle was also part of her custom set up device.  Verification simulation note: An AP and lateral film was obtained through the pelvis area. This was compared to the patient's planning films documenting accurate position of the tandem/ring system for treatment.  High-dose-rate brachytherapy treatment note:  The remote afterloading device was accessed through catheter system and attached to the tandem ring system. Patient then proceeded to undergo her third high-dose-rate treatment directed at the proximal vagina. The patient was prescribed a dose of 5.5 gray to be delivered to the mucosal surface. Patient was treated with 2 channels using 27 dwell positions. Treatment time was 406.3 seconds. The patient tolerated the procedure well.  After completion of her therapy, a radiation survey was performed documenting return of the iridium source into the [employer] safe. The patient was then transferred to the nursing suite. She then had removal of the rectal paddle followed by the tandem and ring system. The patient tolerated the removal well.  PLAN: The patient will return for her fourth brachytherapy treatment on 11/30/15.  ________________________________   Blair Promise, PhD, MD  This document serves as a record of services personally performed by Gery Pray, MD. It was created on his behalf by Darcus Austin, a trained medical scribe. The creation of this record is based on the scribe's personal observations and the provider's statements to them. This document has been checked and approved by the attending provider.

## 2015-11-23 NOTE — Progress Notes (Signed)
Patient reports pain at a 4/10 now. Will continue to monitor.

## 2015-11-23 NOTE — Interval H&P Note (Signed)
History and Physical Interval Note:  11/23/2015 7:41 AM  Lindsey Hughes  has presented today for surgery, with the diagnosis of EXOCERVIX  The various methods of treatment have been discussed with the patient and family. After consideration of risks, benefits and other options for treatment, the patient has consented to  Procedure(s): TANDEM RING INSERTION (N/A) as a surgical intervention .  The patient's history has been reviewed, patient examined, no change in status, stable for surgery.  I have reviewed the patient's chart and labs.  Questions were answered to the patient's satisfaction.     Gery Pray

## 2015-11-23 NOTE — Progress Notes (Signed)
IMMEDIATELY FOLLOWING SURGERY: Do not drive or operate machinery for the first twenty four hours after surgery. Do not make any important decisions for twenty four hours after surgery or while taking narcotic pain medications or sedatives. If you develop intractable nausea and vomiting or a severe headache please notify your doctor immediately.   FOLLOW-UP: You do not need to follow up with anesthesia unless specifically instructed to do so.   WOUND CARE INSTRUCTIONS (if applicable): Expect some mild vaginal bleeding, but if large amount of bleeding occurs please contact Dr. Sondra Come at [phone] or the Radiation On-Call physician. Call for any fever greater than 101.0 degrees or increasing vaginal//abdominal pain or trouble urinating.   QUESTIONS?: Please feel free to call your physician or the hospital operator if you have any questions, and they will be happy to assist you. Resume all medications: as listed on your after visit summary. Your next appointment is:  Future Appointments Date Time Provider [place]  11/30/2015 6:30 AM [facility] 1 [facility]US [place]  11/30/2015 9:15 AM [facility] LAB 3 [facility] None  11/30/2015 10:00 AM Gery Pray, MD [facility] None  11/30/2015 1:00 PM [facility] HDR SUITE [facility] None  12/06/2015 8:15 AM [facility] LAB 3 [facility] None  12/06/2015 8:30 AM [facility] FLUSH NURSE [facility] None  12/06/2015 9:00 AM Lennis Marion Downer, MD [facility] None  12/07/2015 6:30 AM [facility] 1 [facility]US   12/07/2015 9:00 AM Gery Pray, MD [facility] None  12/07/2015 1:00 PM [facility] HDR SUITE [facility] None

## 2015-11-23 NOTE — Transfer of Care (Signed)
Immediate Anesthesia Transfer of Care Note  Patient: Lindsey Hughes  Procedure(s) Performed: Procedure(s) (LRB): TANDEM RING INSERTION (N/A)  Patient Location: PACU  Anesthesia Type: General  Level of Consciousness: awake, oriented, sedated and patient cooperative  Airway & Oxygen Therapy: Patient Spontanous Breathing and Patient connected to face mask oxygen  Post-op Assessment: Report given to PACU RN and Post -op Vital signs reviewed and stable  Post vital signs: Reviewed and stable  Complications: No apparent anesthesia complications  Last Vitals:  Vitals:   11/23/15 0601 11/23/15 0815  BP: 103/68 117/78  Pulse: 88 100  Resp: 16 16  Temp: 36.4 C 36.1 C

## 2015-11-23 NOTE — Progress Notes (Signed)
Pain denies having pain.  Foley catheter removed intact.  Patient tolerated well.

## 2015-11-23 NOTE — Progress Notes (Signed)
  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  Identity was confirmed.  All relevant records and images related to the planned course of therapy were reviewed.  The patient freely provided informed written consent to proceed with treatment after reviewing the details related to the planned course of therapy. The consent form was witnessed and verified by the simulation staff.  Then, the patient was set-up in a stable reproducible  supine position for radiation therapy.  CT images were obtained.  Surface markings were placed.  The CT images were loaded into the planning software.  Then the target and avoidance structures were contoured.  Treatment planning then occurred.  The radiation prescription was entered and confirmed.   I have requested : Brachytherapy Isodose Plan and Dosimetry Calculations to plan the radiation distribution.    PLAN:  The patient will receive 5.5 Gy in 1 fraction. Iridium 192 will be the high-dose-rate source.  ________________________________  Blair Promise, PhD, MD  This document serves as a record of services personally performed by Gery Pray, MD. It was created on his behalf by Darcus Austin, a trained medical scribe. The creation of this record is based on the scribe's personal observations and the provider's statements to them. This document has been checked and approved by the attending provider.

## 2015-11-23 NOTE — H&P (View-Only) (Signed)
Radiation Oncology         [phone] ________________________________  History and physical examination  Name: Lindsey Hughes MRN: [id number]  Date: 10/06/2015  DOB: 12-06-1970  CS:[id number], Anderson Malta L, PA-C  No ref. 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   HISTORY OF PRESENT ILLNESS::Lindsey Hughes is a 45 y.o. 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 Patient is currently undergoing external beam radiation therapy along with radiosensitizing chemotherapy. Her pelvic and low back pain has improved with her treatments as well as her vaginal bleeding. Patient is now taken to the operating room for her first brachytherapy procedure using iridium 192 with the tandem ring system Dr. Denman George will be present for exam under anesthesia and  placement of cervical sleeve for multiple tandem ring insertions.   PAST MEDICAL HISTORY:  has a past medical history of Hypothyroidism; Anemia; Depression; Headache; Radiation; GERD (gastroesophageal reflux disease); Chemotherapy-induced nausea; Chemotherapy induced diarrhea; Port-a-cath in place; History of thrombophlebitis (RESOLVED-- but per pt prefer no bp or  puncture done in right arm); and Cervical cancer [facility]) (oncologist-  dr livesay/  dr Brysyn Brandenberger/  dr Denman George).    PAST SURGICAL HISTORY: Past Surgical History  Procedure Laterality Date  . Cesarean section  1991   . Carpal tunnel release Bilateral 2002 and 2009  . Lasik Left 2015  . Dilation and curettage of uterus N/A 09/03/2015    Procedure: EXAM UNDER ANESTHESIA  WITH FROZEN SECTION CERVICAL BIOPSY & ENDOCERVICAL CURRETTINGS;  Surgeon: Aloha Gell, MD;  Location: [facility];  Service: Gynecology;  Laterality: N/A;  . Tubal ligation  1996  . Portacath placement  10-11-2015  . Echo stress test  12-10-2014    normal w/ no evidence for  stress-induced ischemia/  normal LV function and wall motion , ef 65-70%    FAMILY HISTORY: family history includes Brain cancer in her father; CAD in her brother and mother; Lung cancer in her father and mother.  SOCIAL HISTORY:  reports that she has never smoked. She has never used smokeless tobacco. She reports that she does not drink alcohol or use illicit drugs.  ALLERGIES: Zofran  MEDICATIONS:  No current facility-administered medications for this encounter.   Current Outpatient Prescriptions  Medication Sig Dispense Refill  . acetaminophen (TYLENOL) 500 MG tablet Take 500 mg by mouth every 6 (six) hours as needed. Reported on 11/02/2015    . ALPRAZolam (XANAX) 0.25 MG tablet Take 0.25 mg by mouth as needed for anxiety. Reported on 10/21/2015    . ibuprofen (ADVIL,MOTRIN) 800 MG tablet Take 800 mg by mouth every 8 (eight) hours as needed for mild pain or moderate pain. Reported on 11/02/2015  4  . lidocaine-prilocaine (EMLA) cream Apply  1-2 hrs prior to Portacath access prn. 30 g 1  . loperamide (IMODIUM) 1 MG/5ML solution Take by mouth as needed for diarrhea or loose stools. Reported on 10/29/2015    . LORazepam (ATIVAN) 1 MG tablet Place 1/2 -1 tabled under the tongue or swallow every 6 hrs as needed for nausea.  Will make drowsy. 20 tablet 0  . magic mouthwash SOLN Take 5-10 mLs by mouth every 4 (four) hours as needed for mouth pain. 1:1 ratio 2% viscous lidocaine;benadryl, and nystatin 480 mL 1  . oxyCODONE-acetaminophen (ROXICET) 5-325 MG tablet Take 1 tablet by mouth every 4 (four) hours as needed for severe pain. Oxycodone/percocet/roxicet,  30 tablets, no refills 12 tablet 0  . pantoprazole (PROTONIX) 40 MG tablet Take 1 tablet (40 mg total) by mouth daily. (Patient taking differently: Take 40 mg by mouth every morning. ) 30 tablet 2  . prochlorperazine (COMPAZINE) 10 MG tablet Take 1 tablet (10 mg total) by mouth every 6 (six) hours as needed for nausea or vomiting. 30 tablet 1  .  triamcinolone (KENALOG) 0.1 % paste Apply to mouth ulers twice a day. (Patient taking differently: as needed. Apply to mouth ulers twice a day.) 5 g 1  . levothyroxine (SYNTHROID, LEVOTHROID) 50 MCG tablet Take 50 mcg by mouth daily before breakfast. Reported on 11/02/2015--  Pt per last dose 09-24-2015  approx.     Facility-Administered Medications Ordered in Other Encounters  Medication Dose Route Frequency Provider Last Rate Last Dose  . famotidine (PEPCID) IVPB 20 mg premix  20 mg Intravenous Q12H Lennis P Livesay, MD   20 mg at 10/07/15 1328    REVIEW OF SYSTEMS:  A 15 point review of systems is documented in the electronic medical record. This was obtained by the nursing staff. However, I reviewed this with the patient to discuss relevant findings and make appropriate changes. Patient has had some nausea with her Radiation and chemotherapy. She has also had some diarrhea with her treatment. Dysuria is also one of her side effects    PHYSICAL EXAM:  height is 5\' 2"  (1.575 m) and weight is 207 lb 6.4 oz (94.076 kg). Her oral temperature is 98.2 F (36.8 C). Her blood pressure is 126/84 and her pulse is 81. Her respiration is 16 and oxygen saturation is 100%.  General: Alert and oriented, in no acute distress HEENT: Head is normocephalic. Extraocular movements are intact. Oropharynx is clear. Teeth in good repair Neck: Neck is supple, no palpable cervical or supraclavicular lymphadenopathy. Heart: Regular in rate and rhythm with no murmurs, rubs, or gallops. Chest: Clear to auscultation bilaterally, with no rhonchi, wheezes, or rales. Abdomen: Soft, nontender, nondistended, with no rigidity or guarding. Extremities: No cyanosis or edema. Lymphatics: see Neck Exam Skin: No concerning lesions. Musculoskeletal: symmetric strength and muscle tone throughout. Neurologic: Cranial nerves II through XII are grossly intact. No obvious focalities. Speech is fluent. Coordination is  intact. Psychiatric: Judgment and insight are intact. Affect is appropriate. Pelvic exam deferred until operating room procedure   LABORATORY DATA:  Lab Results  Component Value Date   WBC 2.0* 11/08/2015   HGB 10.1* 11/08/2015   HCT 29.5* 11/08/2015   MCV 84.5 11/08/2015   PLT 100* 11/08/2015   NEUTROABS 1.4* 11/08/2015   Lab Results  Component Value Date   NA 139 11/08/2015   K 3.6 11/08/2015   CL 104 10/11/2015   CO2 25 11/08/2015   GLUCOSE 113 11/08/2015   CREATININE 0.8 11/08/2015   CALCIUM 9.0 11/08/2015      RADIOGRAPHY: Ir Fluoro Guide Cv Line Right  10/11/2015  INDICATION: 45 year old with cervical cancer. Port-A-Cath needed for chemotherapy. EXAM: FLUOROSCOPIC AND ULTRASOUND GUIDED PLACEMENT OF A SUBCUTANEOUS PORT COMPARISON:  None. MEDICATIONS: Ancef 2 g; The antibiotic was administered within an appropriate time interval prior to skin puncture. ANESTHESIA/SEDATION: Versed 3.0 mg IV; Fentanyl 150 mcg IV; Moderate Sedation Time:  35 minutes The patient was continuously monitored during the procedure by the interventional radiology nurse under my direct supervision. FLUOROSCOPY TIME:  24 seconds (4 mGy) COMPLICATIONS: None immediate. PROCEDURE: The procedure, risks, benefits, and alternatives were explained to the patient. Questions regarding the procedure  were encouraged and answered. The patient understands and consents to the procedure. Patient was placed supine on the interventional table. Ultrasound confirmed a patent right internal jugular vein. The right chest and neck were cleaned with a skin antiseptic and a sterile drape was placed. Maximal barrier sterile technique was utilized including caps, mask, sterile gowns, sterile gloves, sterile drape, hand hygiene and skin antiseptic. The right neck was anesthetized with 1% lidocaine. Small incision was made in the right neck with a blade. Micropuncture set was placed in the right internal jugular vein with ultrasound  guidance. The micropuncture wire was used for measurement purposes. The right chest was anesthetized with 1% lidocaine with epinephrine. #15 blade was used to make an incision and a subcutaneous port pocket was formed. [place] was assembled. Subcutaneous tunnel was formed with a stiff tunneling device. The port catheter was brought through the subcutaneous tunnel. The port was placed in the subcutaneous pocket. The micropuncture set was exchanged for a peel-away sheath. The catheter was placed through the peel-away sheath and the tip was positioned at the superior cavoatrial junction. Catheter placement was confirmed with fluoroscopy. The port was accessed and flushed with heparinized saline. The port pocket was closed using two layers of absorbable sutures and Dermabond. The vein skin site was closed using a single layer of absorbable suture and Dermabond. Sterile dressings were applied. Patient tolerated the procedure well without an immediate complication. Ultrasound and fluoroscopic images were taken and saved for this procedure. IMPRESSION: Placement of a subcutaneous port device. Catheter tip at the superior cavoatrial junction. Electronically Signed   By: Markus Daft M.D.   On: 10/11/2015 14:52   Ir US Guide Vasc Access Right  10/11/2015  INDICATION: 45 year old with cervical cancer. Port-A-Cath needed for chemotherapy. EXAM: FLUOROSCOPIC AND ULTRASOUND GUIDED PLACEMENT OF A SUBCUTANEOUS PORT COMPARISON:  None. MEDICATIONS: Ancef 2 g; The antibiotic was administered within an appropriate time interval prior to skin puncture. ANESTHESIA/SEDATION: Versed 3.0 mg IV; Fentanyl 150 mcg IV; Moderate Sedation Time:  35 minutes The patient was continuously monitored during the procedure by the interventional radiology nurse under my direct supervision. FLUOROSCOPY TIME:  24 seconds (4 mGy) COMPLICATIONS: None immediate. PROCEDURE: The procedure, risks, benefits, and alternatives were explained to the  patient. Questions regarding the procedure were encouraged and answered. The patient understands and consents to the procedure. Patient was placed supine on the interventional table. Ultrasound confirmed a patent right internal jugular vein. The right chest and neck were cleaned with a skin antiseptic and a sterile drape was placed. Maximal barrier sterile technique was utilized including caps, mask, sterile gowns, sterile gloves, sterile drape, hand hygiene and skin antiseptic. The right neck was anesthetized with 1% lidocaine. Small incision was made in the right neck with a blade. Micropuncture set was placed in the right internal jugular vein with ultrasound guidance. The micropuncture wire was used for measurement purposes. The right chest was anesthetized with 1% lidocaine with epinephrine. #15 blade was used to make an incision and a subcutaneous port pocket was formed. [place] was assembled. Subcutaneous tunnel was formed with a stiff tunneling device. The port catheter was brought through the subcutaneous tunnel. The port was placed in the subcutaneous pocket. The micropuncture set was exchanged for a peel-away sheath. The catheter was placed through the peel-away sheath and the tip was positioned at the superior cavoatrial junction. Catheter placement was confirmed with fluoroscopy. The port was accessed and flushed with heparinized saline. The port  pocket was closed using two layers of absorbable sutures and Dermabond. The vein skin site was closed using a single layer of absorbable suture and Dermabond. Sterile dressings were applied. Patient tolerated the procedure well without an immediate complication. Ultrasound and fluoroscopic images were taken and saved for this procedure. IMPRESSION: Placement of a subcutaneous port device. Catheter tip at the superior cavoatrial junction. Electronically Signed   By: Markus Daft M.D.   On: 10/11/2015 14:52      IMPRESSION: Stage II-B squamous cell  carcinoma cervix. The patient is now ready to proceed with brachytherapy as part of her overall management.  PLAN: Patient will proceed to the operating room for scheduled case on July 18 at 7:15 AM.    ------------------------------------------------  Blair Promise, PhD, MD

## 2015-11-23 NOTE — Telephone Encounter (Signed)
Called in prescriptions for anusol suppositories and cream to [facility] per Dr. Sondra Come.

## 2015-11-23 NOTE — Telephone Encounter (Signed)
Spoke with Ms. Spadafora to confirm she received the message below about urine culture.  She is on her way to pick up ATB.

## 2015-11-23 NOTE — Brief Op Note (Signed)
11/23/2015  8:59 AM  PATIENT:  Lindsey Hughes  45 y.o. female  PRE-OPERATIVE DIAGNOSIS:  EXOCERVIX  POST-OPERATIVE DIAGNOSIS:  EXOCERVIX  PROCEDURE:  Procedure(s): TANDEM RING INSERTION (N/A)  SURGEON:  Surgeon(s) and Role:    * Gery Pray, MD - Primary  PHYSICIAN ASSISTANT:   ASSISTANTS: none   ANESTHESIA:   general  EBL:  Total I/O In: 350 [I.V.:300; IV Piggyback:50] Out: 5 [Blood:5]  BLOOD ADMINISTERED:none  DRAINS: Urinary Catheter (Foley)   LOCAL MEDICATIONS USED:  NONE  SPECIMEN:  No Specimen  DISPOSITION OF SPECIMEN:  N/A  COUNTS:  YES  TOURNIQUET:  * No tourniquets in log *  DICTATION:  The patient was taken to outpatient OR #1. Timeout was performed before the procedure was initiated.  The perineum and vaginal cavity was prepped by OR staff. A 16 French Foley catheter was initially placed however there was noted to be urine leakage around the catheter and therefore patient had a 20 French Foley catheter placed during the procedure. Approximately 20 mL of sterile water was placed in the Foley balloon. Patient has been found to have a urinary tract infection. Ampicillin has been recommended for coverage of this bacteria and the patient was given 1 g of ampicillin at the initiation of her procedure. Patient had not started her outpatient antibiotics yesterday. On exam under anesthesia the patient was noted to have excellent response to her external beam and radiosensitizing chemotherapy. The cervical mass was actually noted to be along the anterior lip of the cervix and had decreased in size from approximately 6 cm down to 2 cm on clinical exam. There is no further parametrial extension noted. . . Patient then had initiation of intraoperative ultrasound. Approximately 250 mL of sterile water was placed within the bladder for imaging purposes. Urine leakage was noted around the Foley catheter despite the large balloon and 20 French catheter. The uterus was noted to be in  an anteverted position. The cervical sleeve was noted to be sewn in good position along the cervical os. Patient then had placement of a 60 mm, 60tandem within the cervical and endometrial cavity within the cervical sleeve. A 60 tandem was chosen this time to potentially lower dose to the bladder. Excellent placement was noted on intraoperative ultrasound. Patient then had placement of a 60ring with small shielding cap In place. Patient then had placement of a rectal paddle to shield the anterior rectum from high-dose radiation. Estrace cream was placed within the vaginal vault and perineum to aid in implant removal and for comfort issues. Intraoperative ultrasound confirmed good placement of tandem and ring. Patient was subsequently transported to the recovery room in stable condition. Later in the day the patient will undergo planning and her second high-dose-rate treatment with iridium 192 as the high-dose-rate source. The patient will receive approximately 5.5 Gy to the clinical target volume.  PLAN OF CARE: Transferred to radiation oncology for planning and treatment  PATIENT DISPOSITION:  PACU - hemodynamically stable.   Delay start of Pharmacological VTE agent (>24hrs) due to surgical blood loss or risk of bleeding: not applicable

## 2015-11-23 NOTE — Anesthesia Procedure Notes (Signed)
Procedure Name: LMA Insertion Date/Time: 11/23/2015 7:36 AM Performed by: Denna Haggard D Pre-anesthesia Checklist: Patient identified, Emergency Drugs available, Suction available and Patient being monitored Patient Re-evaluated:Patient Re-evaluated prior to inductionOxygen Delivery Method: Circle system utilized Preoxygenation: Pre-oxygenation with 100% oxygen Intubation Type: IV induction Ventilation: Mask ventilation without difficulty LMA: LMA inserted LMA Size: 4.0 Number of attempts: 1 Airway Equipment and Method: Bite block Placement Confirmation: positive ETCO2 Tube secured with: Tape Dental Injury: Teeth and Oropharynx as per pre-operative assessment

## 2015-11-24 ENCOUNTER — Telehealth: Payer: Self-pay | Admitting: Oncology

## 2015-11-24 ENCOUNTER — Encounter (HOSPITAL_BASED_OUTPATIENT_CLINIC_OR_DEPARTMENT_OTHER): Payer: Self-pay | Admitting: Radiation Oncology

## 2015-11-24 NOTE — Telephone Encounter (Signed)
Lindsey Hughes called and asked which cream she should use on her urethra.  Advised her to apply a small amount of lidocaine jelly around her urethra about 15 minutes before urinating.  She verbalized understanding and agreement. She also said she is feeling better today.  She used an Anusol suppository yesterday and it helped a lot.  Advised her to call with any further questions.

## 2015-11-24 NOTE — Anesthesia Postprocedure Evaluation (Signed)
Anesthesia Post Note  Patient: Lindsey Hughes  Procedure(s) Performed: Procedure(s) (LRB): TANDEM RING INSERTION (N/A)  Patient location during evaluation: PACU Vital Signs Assessment: post-procedure vital signs reviewed and stable Postop Assessment: no signs of nausea or vomiting Anesthetic complications: no     Last Vitals:  Vitals:   11/23/15 0845 11/23/15 0900  BP: 109/78 109/73  Pulse: 83 78  Resp: 14 (!) 7  Temp:      Last Pain:  Vitals:   11/23/15 0900  TempSrc:   PainSc: 9    Pain Goal: Patients Stated Pain Goal: 7 (11/23/15 0659)               Flavia Bruss, Ear[place] Navy

## 2015-11-25 ENCOUNTER — Encounter: Payer: Self-pay | Admitting: Oncology

## 2015-11-25 ENCOUNTER — Other Ambulatory Visit: Payer: Self-pay | Admitting: Nurse Practitioner

## 2015-11-25 ENCOUNTER — Other Ambulatory Visit (HOSPITAL_BASED_OUTPATIENT_CLINIC_OR_DEPARTMENT_OTHER): Payer: BLUE CROSS/BLUE SHIELD

## 2015-11-25 ENCOUNTER — Ambulatory Visit (HOSPITAL_COMMUNITY)
Admission: RE | Admit: 2015-11-25 | Discharge: 2015-11-25 | Disposition: A | Discharge status: Home / Self Care | Payer: BLUE CROSS/BLUE SHIELD | Source: Ambulatory Visit | Attending: Oncology | Admitting: Oncology

## 2015-11-25 ENCOUNTER — Telehealth: Payer: Self-pay | Admitting: Nurse Practitioner

## 2015-11-25 ENCOUNTER — Telehealth: Payer: Self-pay | Admitting: Oncology

## 2015-11-25 ENCOUNTER — Ambulatory Visit (HOSPITAL_BASED_OUTPATIENT_CLINIC_OR_DEPARTMENT_OTHER): Payer: BLUE CROSS/BLUE SHIELD | Admitting: Oncology

## 2015-11-25 ENCOUNTER — Encounter: Payer: BLUE CROSS/BLUE SHIELD | Admitting: Nurse Practitioner

## 2015-11-25 ENCOUNTER — Other Ambulatory Visit: Payer: Self-pay | Admitting: Oncology

## 2015-11-25 VITALS — BP 110/73 | HR 75 | Temp 98.1°F | Resp 16

## 2015-11-25 DIAGNOSIS — N390 Urinary tract infection, site not specified: Secondary | ICD-10-CM

## 2015-11-25 DIAGNOSIS — E039 Hypothyroidism, unspecified: Secondary | ICD-10-CM | POA: Diagnosis not present

## 2015-11-25 DIAGNOSIS — Z95828 Presence of other vascular implants and grafts: Secondary | ICD-10-CM | POA: Diagnosis not present

## 2015-11-25 DIAGNOSIS — C531 Malignant neoplasm of exocervix: Secondary | ICD-10-CM

## 2015-11-25 DIAGNOSIS — I808 Phlebitis and thrombophlebitis of other sites: Secondary | ICD-10-CM | POA: Diagnosis present

## 2015-11-25 DIAGNOSIS — C539 Malignant neoplasm of cervix uteri, unspecified: Secondary | ICD-10-CM | POA: Insufficient documentation

## 2015-11-25 DIAGNOSIS — I82621 Acute embolism and thrombosis of deep veins of right upper extremity: Secondary | ICD-10-CM | POA: Diagnosis not present

## 2015-11-25 HISTORY — DX: Phlebitis and thrombophlebitis of other sites: I80.8

## 2015-11-25 LAB — PROTIME-INR
INR: 0.9 — AB (ref 2.00–3.50)
Protime: 10.8 Seconds (ref 10.6–13.4)

## 2015-11-25 LAB — CBC WITH DIFFERENTIAL/PLATELET
BASO%: 0.4 % (ref 0.0–2.0)
Basophils Absolute: 0 10*3/uL (ref 0.0–0.1)
EOS ABS: 0 10*3/uL (ref 0.0–0.5)
EOS%: 0.1 % (ref 0.0–7.0)
HCT: 31 % — ABNORMAL LOW (ref 34.8–46.6)
HGB: 10.4 g/dL — ABNORMAL LOW (ref 11.6–15.9)
LYMPH%: 7.8 % — AB (ref 14.0–49.7)
MCH: 29.9 pg (ref 25.1–34.0)
MCHC: 33.5 g/dL (ref 31.5–36.0)
MCV: 89.2 fL (ref 79.5–101.0)
MONO#: 0.5 10*3/uL (ref 0.1–0.9)
MONO%: 12.7 % (ref 0.0–14.0)
NEUT%: 79 % — ABNORMAL HIGH (ref 38.4–76.8)
NEUTROS ABS: 3.1 10*3/uL (ref 1.5–6.5)
Platelets: 230 10*3/uL (ref 145–400)
RBC: 3.47 10*6/uL — AB (ref 3.70–5.45)
RDW: 21.5 % — ABNORMAL HIGH (ref 11.2–14.5)
WBC: 3.9 10*3/uL (ref 3.9–10.3)
lymph#: 0.3 10*3/uL — ABNORMAL LOW (ref 0.9–3.3)

## 2015-11-25 LAB — COMPREHENSIVE METABOLIC PANEL
ALT: 22 U/L (ref 0–55)
AST: 17 U/L (ref 5–34)
Albumin: 3.6 g/dL (ref 3.5–5.0)
Alkaline Phosphatase: 79 U/L (ref 40–150)
Anion Gap: 14 mEq/L — ABNORMAL HIGH (ref 3–11)
BUN: 15.4 mg/dL (ref 7.0–26.0)
CO2: 22 meq/L (ref 22–29)
Calcium: 9.6 mg/dL (ref 8.4–10.4)
Chloride: 102 mEq/L (ref 98–109)
Creatinine: 0.9 mg/dL (ref 0.6–1.1)
EGFR: 77 mL/min/{1.73_m2} — AB (ref >90)
GLUCOSE: 100 mg/dL (ref 70–140)
POTASSIUM: 3.5 meq/L (ref 3.5–5.1)
SODIUM: 139 meq/L (ref 136–145)
TOTAL PROTEIN: 7.1 g/dL (ref 6.4–8.3)

## 2015-11-25 LAB — MAGNESIUM: Magnesium: 1.8 mg/dl (ref 1.5–2.5)

## 2015-11-25 MED ORDER — WARFARIN SODIUM 5 MG PO TABS: ORAL_TABLET | ORAL | 1 refills | Status: DC

## 2015-11-25 MED ORDER — ENOXAPARIN SODIUM 150 MG/ML ~~LOC~~ SOLN: 140.0000 mg | SUBCUTANEOUS | 1 refills | Status: DC

## 2015-11-25 MED FILL — WARFARIN SODIUM 5 MG TABLET: 5 | 15 days supply | Qty: 30 | Fill #0

## 2015-11-25 MED FILL — ENOXAPARIN 100 MG/ML SYR: 100 | 10 days supply | Qty: 10 | Fill #0

## 2015-11-25 MED FILL — ENOXAPARIN 40 MG/0.4 ML SYR: 40 | 10 days supply | Qty: 4 | Fill #0

## 2015-11-25 NOTE — Telephone Encounter (Signed)
Added smc apt per pof °

## 2015-11-25 NOTE — Progress Notes (Signed)
*  Preliminary Results* Right upper extremity venous duplex completed. Right upper extremity is positive for acute deep and superficial vein thrombosis involving the right axillary, right brachial, and right basilic veins.  Preliminary results discussed with Dr. Marko Plume.  11/25/2015 11:26 AM  Maudry Mayhew, B.S., RVT, RDCS, RDMS

## 2015-11-25 NOTE — Progress Notes (Signed)
OFFICE PROGRESS NOTE   November 26, 2015   Physicians:Emma Rossi,[place], [place], [place]* (PCP [facility]), Gery Pray, Aloha Gell  INTERVAL HISTORY:   Patient is seen, together with adult daughter and young stepdaughter, as work in visit today due to tender swelling right upper arm. 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 She is receiving vaginal brachytherapy. She has PAC in right chest, with superficial thrombus in right basilic vein without DVT on 10-25-15.   Patient has had no further problems with RUE or PAC until she awoke this AM with tenderness upper inner right arm, with slight localized swelling and slight erythema. SHe has had no IVs or blood draws from RUE in last couple of weeks. She has had no fever, no trauma, no discomfort at [facility]. She has had more loose stools, including x 4 yesterday and this AM; she used total of 2 lomotil yesterday. She has burning on perineum with voiding or bowel movements, using desitin. She is not using sitz baths; discussed voiding into sitz. She denies SOB, chest pain, LE swelling, any bleeding. She is having hot flashes, sleeps poorly with these "maybe 3 hours last PM".  She continues amoxicillin 500 mg q 12 hrs for B strep UTI, this begun 7-28 for 5 days.  Remainder of 10 point Review of Systems negative.   ONCOLOGIC HISTORY Patient has history of abnormal PAPs ~ 5-7 years ago, not followed up. She presented to PCP with menorrhagia x 3-4 months, which progressed to continuous bleeding, Evaluation by Dr Pamala Hurry was complicated by cervical stenosis, and bleeding did not improve with hormonal intervention. [place] 07-15-15 showed normal right ovary, probably normal left ovary and uterine fibroid. Endometrial biopsy was accomplished on 08-12-15, pathology "weakly proliferative endometrium with glandular and stromal breakdown and abundant blood, no endometrial  hyperplasia identified". She was taken for planned robotic hysterectomy by Dr Pamala Hurry on 09-03-15, however exam under anesthesia revealed abnormalities in posterior lip of cervix and hysterectomy not done. Biopsy [id number] invasive poorly differentiated squamous cell carcinoma with involvement of endocervix and focal vascular invasion. 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 She was seen in consultation by Dr Denman George on 09-16-15, with posterior lip of cervix replaced by 6 cm tumor, with tumor beginning to infiltrate into bilateral parametria. Recommendation is for radiation with sensitizing chemotherapy. PET 09-17-15 showed intensely hypermetabolic cervical mass 5.2 x 3.3 cm (SUV 42), mild hypermetabolic uptake thru endometrium (SUV 6.2) and in high left adnexa without CT correlate, bilateral external iliac nodes 0.8 - 1.1 cm with low level uptake.Recommendation is for sensitizing chemotherapy with radiation. Weekly CDDP given x 6 from 10-04-15 thru 11-15-15.    Objective:  Vital signs in last 24 hours:  BP 110/73 (BP Location: Left Arm, Patient Position: Sitting)   Pulse 75   Temp 98.1 F (36.7 C) (Oral)   Resp 16  Last weight in this EMR 206 lbs on 10-28-15 Alert, oriented and appropriate. Ambulatory without assistance. Does not appear in any acute discomfort. Respirations not labored RA. No alopecia  HEENT:PERRL, sclerae not icteric. Oral mucosa moist without lesions, posterior pharynx clear.  Lymphatics:no cervical,spuraclavicular or inguinal adenopathy. No swelling obvious right antecubital or right neck.  Resp: clear to auscultation bilaterally and normal percussion bilaterally Cardio: regular rate and rhythm. No gallop. GI: soft, nontender, not distended, no mass or organomegaly. Normally active bowel sounds.  Musculoskeletal/ Extremities: RUE with 2 cm area of slight swelling and slight erythema  medial upper inner arm, somewhat tender, no streaking, no swelling of hand or arm. without  pitting edema, cords, tenderness Neuro: nonfocal  PSYCH appropriate mood and affect Skin without rash, ecchymosis, petechiae. Perirectal area with desitin in place, no surrounding erythema, no desquamation, no tenderness. No skin desquamation vulva or inguinal areas.  Portacath-without erythema or tenderness  Lab Results: After DVT documented,  CBC with WBC 3.9, ANC 3.1, hgb 10.4, plt 230k INR 0.9 CMET with K 3.5, glu 100, creat 0.9, LFTs ok ,Mg 1.8  Studies/Results:  Phone result from RUE venous doppler study: acute DVT and superficial thrombus right axillary, right brachial and right basilic veins    Medications: I have reviewed the patient's current medications. She will begin lovenox 140 mg daily starting today, #10 of the 150 mg syringes. She will take coumadin 10 mg on 8-4, 8-5, 8-6 and repeat INR on 8-7 with dosing from there. Will overlap lovenox and coumadin by 5-7 days.  Note she is on ampicillin for B strep UTI.  DISCUSSION Patient seen prior to venous doppler and again after that study. She has given injections to herself and to stepdaughter, instructions reviewed by RN.  Fine to use warm soaks also to area of superficial phelbitis.  Discussed using sitz baths regularly and voiding/ having BM into water to improve skin irritation. Fine to continue desitin per radiation oncology. If remains symptomatic on perineum, could add viscous lidocaine to desitin.   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 Weekly sensitizing CDDP with radiation given x 6 cycles from 10-04-15 thru  11-15-15.HDR planned thru 12-07-15.  She is to see this MD again on 12-06-15. 2.RUE DVT with superficial thrombosis: on side of PAC. Begin lovenox today 1.5 mg/kg daily, add coumadin on 11-26-15, overlap by 5-7 days as long as INR therapeutic by then. Recheck INR on 8-7. NOTE she is on antibiotic  which may affect INR initially. Likely will need to remove PAC.  3.hypothyroid: medication as instructed by PCP: PET shows some uptake in left lobe thyroid without discreeet nodule on CT imaging. Will need thyroid US at least after completion of this treatment if not already done by PCP (per patient's history, probably has not had thyroid US) 4.social stress with 53 yo step daughter recently diagnosed with renal failure, apparently congenital renal problems not previously known. On wait list for renal transplant. In and out of [place] support staff aware 5.multiple bowel movements daily as baseline. No c diff, using lomotil/  imodium per rad onc 6.vaginal bleeding has stopped: Can skip days with oral iron if needed due to GI symptoms 7.PAC in 8.>100k b strep on urine culture 11-19-15, symptomatic, now on amoxicillin 9.chemo nausea and GERD: much better with compazine and protonix 10.post bilateral carpal tunnel surgery and BTL 11. Vasomotor symptoms also interfering with sleep.  All questions answered and patient is in agreement with plans for anticoagulation as above. Time spent 30 min including >50% counseling and coordination of care.   Evlyn Clines, MD   11/26/2015, 3:05 PM

## 2015-11-25 NOTE — Telephone Encounter (Signed)
per pof to sch pt appt-gave pt copy of avs °

## 2015-11-26 ENCOUNTER — Ambulatory Visit: Payer: BLUE CROSS/BLUE SHIELD

## 2015-11-26 ENCOUNTER — Other Ambulatory Visit: Payer: Self-pay | Admitting: Oncology

## 2015-11-26 ENCOUNTER — Encounter (HOSPITAL_BASED_OUTPATIENT_CLINIC_OR_DEPARTMENT_OTHER): Payer: Self-pay | Admitting: *Deleted

## 2015-11-26 DIAGNOSIS — C531 Malignant neoplasm of exocervix: Secondary | ICD-10-CM

## 2015-11-26 DIAGNOSIS — I82721 Chronic embolism and thrombosis of deep veins of right upper extremity: Secondary | ICD-10-CM | POA: Insufficient documentation

## 2015-11-26 DIAGNOSIS — I82621 Acute embolism and thrombosis of deep veins of right upper extremity: Secondary | ICD-10-CM

## 2015-11-26 DIAGNOSIS — N390 Urinary tract infection, site not specified: Secondary | ICD-10-CM | POA: Insufficient documentation

## 2015-11-26 NOTE — Progress Notes (Signed)
NPO AFTER MN.  ARRIVE AT 0600.  CURRENT LAB RESULTS IN CHART AND EPIC.  WILL TAKE PROTONIX AM DOS W/ SIPS OF WATER.  NOTE PT ON  DVT RIGHT UPPER ARM DIAGNOSED 11-25-2015 DUE TO IV ACCESS DIFFICULT FOR FIRST CHEMO.  STARTED COUMADIN 11-26-2015.

## 2015-11-29 ENCOUNTER — Other Ambulatory Visit: Payer: BLUE CROSS/BLUE SHIELD

## 2015-11-30 ENCOUNTER — Ambulatory Visit (HOSPITAL_BASED_OUTPATIENT_CLINIC_OR_DEPARTMENT_OTHER): Payer: BLUE CROSS/BLUE SHIELD

## 2015-11-30 ENCOUNTER — Telehealth: Payer: Self-pay | Admitting: Oncology

## 2015-11-30 ENCOUNTER — Ambulatory Visit (HOSPITAL_COMMUNITY)
Admission: RE | Admit: 2015-11-30 | Discharge: 2015-11-30 | Disposition: A | Discharge status: Home / Self Care | Payer: BLUE CROSS/BLUE SHIELD | Source: Ambulatory Visit | Attending: Radiation Oncology | Admitting: Radiation Oncology

## 2015-11-30 ENCOUNTER — Ambulatory Visit
Admission: RE | Admit: 2015-11-30 | Discharge: 2015-11-30 | Disposition: A | Discharge status: Home / Self Care | Payer: BLUE CROSS/BLUE SHIELD | Source: Ambulatory Visit | Attending: Radiation Oncology | Admitting: Radiation Oncology

## 2015-11-30 ENCOUNTER — Ambulatory Visit (HOSPITAL_BASED_OUTPATIENT_CLINIC_OR_DEPARTMENT_OTHER): Payer: BLUE CROSS/BLUE SHIELD | Admitting: Anesthesiology

## 2015-11-30 ENCOUNTER — Encounter (HOSPITAL_BASED_OUTPATIENT_CLINIC_OR_DEPARTMENT_OTHER): Payer: Self-pay | Admitting: Certified Registered"

## 2015-11-30 ENCOUNTER — Other Ambulatory Visit: Payer: Self-pay | Admitting: Oncology

## 2015-11-30 ENCOUNTER — Encounter (HOSPITAL_BASED_OUTPATIENT_CLINIC_OR_DEPARTMENT_OTHER)
Admission: RE | Disposition: A | Discharge status: Home / Self Care | Payer: Self-pay | Source: Ambulatory Visit | Attending: Radiation Oncology

## 2015-11-30 ENCOUNTER — Telehealth: Payer: Self-pay

## 2015-11-30 ENCOUNTER — Ambulatory Visit (HOSPITAL_BASED_OUTPATIENT_CLINIC_OR_DEPARTMENT_OTHER)
Admission: RE | Admit: 2015-11-30 | Discharge: 2015-11-30 | Disposition: A | Discharge status: Home / Self Care | Payer: BLUE CROSS/BLUE SHIELD | Source: Ambulatory Visit | Attending: Radiation Oncology | Admitting: Radiation Oncology

## 2015-11-30 VITALS — BP 125/83 | HR 75

## 2015-11-30 DIAGNOSIS — Z808 Family history of malignant neoplasm of other organs or systems: Secondary | ICD-10-CM | POA: Insufficient documentation

## 2015-11-30 DIAGNOSIS — N390 Urinary tract infection, site not specified: Secondary | ICD-10-CM

## 2015-11-30 DIAGNOSIS — I82621 Acute embolism and thrombosis of deep veins of right upper extremity: Secondary | ICD-10-CM

## 2015-11-30 DIAGNOSIS — C539 Malignant neoplasm of cervix uteri, unspecified: Secondary | ICD-10-CM | POA: Diagnosis present

## 2015-11-30 DIAGNOSIS — Z801 Family history of malignant neoplasm of trachea, bronchus and lung: Secondary | ICD-10-CM | POA: Diagnosis not present

## 2015-11-30 DIAGNOSIS — A499 Bacterial infection, unspecified: Secondary | ICD-10-CM

## 2015-11-30 DIAGNOSIS — K219 Gastro-esophageal reflux disease without esophagitis: Secondary | ICD-10-CM | POA: Diagnosis not present

## 2015-11-30 DIAGNOSIS — Z86718 Personal history of other venous thrombosis and embolism: Secondary | ICD-10-CM | POA: Insufficient documentation

## 2015-11-30 DIAGNOSIS — C531 Malignant neoplasm of exocervix: Secondary | ICD-10-CM

## 2015-11-30 DIAGNOSIS — E039 Hypothyroidism, unspecified: Secondary | ICD-10-CM | POA: Diagnosis not present

## 2015-11-30 DIAGNOSIS — Z7901 Long term (current) use of anticoagulants: Secondary | ICD-10-CM

## 2015-11-30 HISTORY — DX: Acute embolism and thrombosis of deep veins of right upper extremity: I82.621

## 2015-11-30 HISTORY — PX: TANDEM RING INSERTION: SHX6199

## 2015-11-30 HISTORY — DX: Phlebitis and thrombophlebitis of other sites: I80.8

## 2015-11-30 HISTORY — DX: Long term (current) use of anticoagulants: Z79.01

## 2015-11-30 LAB — PROTIME-INR
INR: 1.6 — AB (ref 2.00–3.50)
Protime: 19.2 Seconds — ABNORMAL HIGH (ref 10.6–13.4)

## 2015-11-30 LAB — URINALYSIS, MICROSCOPIC - CHCC
BILIRUBIN (URINE): NEGATIVE
GLUCOSE UR CHCC: NEGATIVE mg/dL
KETONES: NEGATIVE mg/dL
Nitrite: NEGATIVE
Protein: 30 mg/dL
SPECIFIC GRAVITY, URINE: 1.02 (ref 1.003–1.035)
UROBILINOGEN UR: 0.2 mg/dL (ref 0.2–1)
pH: 6 (ref 4.6–8.0)

## 2015-11-30 SURGERY — INSERTION, UTERINE TANDEM AND RING OR CYLINDER, FOR BRACHYTHERAPY
Anesthesia: General

## 2015-11-30 MED ORDER — FENTANYL CITRATE (PF) 100 MCG/2ML IJ SOLN
25.0000 ug | INTRAMUSCULAR | Status: DC | PRN
  Administered 2015-11-30 (×2): 50 ug via INTRAVENOUS
  Filled 2015-11-30: qty 1

## 2015-11-30 MED ORDER — MIDAZOLAM HCL 5 MG/5ML IJ SOLN
INTRAMUSCULAR | Status: DC | PRN
  Administered 2015-11-30: 2 mg via INTRAVENOUS

## 2015-11-30 MED ORDER — LACTATED RINGERS IV SOLN
INTRAVENOUS | Status: DC
  Administered 2015-11-30 (×2): via INTRAVENOUS
  Filled 2015-11-30: qty 1000

## 2015-11-30 MED ORDER — HYDROMORPHONE HCL 1 MG/ML IJ SOLN
0.5000 mg | Freq: Once | INTRAMUSCULAR | Status: AC
  Administered 2015-11-30: 0.5 mg via INTRAVENOUS
  Filled 2015-11-30: qty 1

## 2015-11-30 MED ORDER — HYDROMORPHONE HCL 4 MG/ML IJ SOLN: 0.5000 mg | Freq: Once | INTRAMUSCULAR | Status: DC

## 2015-11-30 MED ORDER — PROPOFOL 10 MG/ML IV BOLUS
INTRAVENOUS | Status: DC | PRN
  Administered 2015-11-30: 50 mg via INTRAVENOUS
  Administered 2015-11-30: 200 mg via INTRAVENOUS

## 2015-11-30 MED ORDER — SCOPOLAMINE 1 MG/3DAYS TD PT72
MEDICATED_PATCH | TRANSDERMAL | Status: AC
  Filled 2015-11-30: qty 1

## 2015-11-30 MED ORDER — LACTATED RINGERS IV SOLN
INTRAVENOUS | Status: DC
  Administered 2015-11-30: 11:00:00 via INTRAVENOUS
  Filled 2015-11-30 (×2): qty 250

## 2015-11-30 MED ORDER — PROMETHAZINE HCL 25 MG/ML IJ SOLN
6.2500 mg | INTRAMUSCULAR | Status: DC | PRN
  Filled 2015-11-30: qty 1

## 2015-11-30 MED ORDER — LIDOCAINE 2% (20 MG/ML) 5 ML SYRINGE
INTRAMUSCULAR | Status: DC | PRN
  Administered 2015-11-30: 60 mg via INTRAVENOUS

## 2015-11-30 MED ORDER — DEXAMETHASONE SODIUM PHOSPHATE 4 MG/ML IJ SOLN
INTRAMUSCULAR | Status: DC | PRN
  Administered 2015-11-30: 10 mg via INTRAVENOUS

## 2015-11-30 MED ORDER — SCOPOLAMINE 1 MG/3DAYS TD PT72
MEDICATED_PATCH | TRANSDERMAL | Status: DC | PRN
  Administered 2015-11-30: 1 via TRANSDERMAL

## 2015-11-30 MED ORDER — WATER FOR IRRIGATION, STERILE IR SOLN
Status: DC | PRN
  Administered 2015-11-30: 500 mL

## 2015-11-30 MED ORDER — FENTANYL CITRATE (PF) 100 MCG/2ML IJ SOLN
INTRAMUSCULAR | Status: AC
  Filled 2015-11-30: qty 2

## 2015-11-30 MED ORDER — MIDAZOLAM HCL 2 MG/2ML IJ SOLN
INTRAMUSCULAR | Status: AC
  Filled 2015-11-30: qty 2

## 2015-11-30 MED ORDER — FENTANYL CITRATE (PF) 100 MCG/2ML IJ SOLN
INTRAMUSCULAR | Status: DC | PRN
  Administered 2015-11-30: 50 ug via INTRAVENOUS
  Administered 2015-11-30: 25 ug via INTRAVENOUS
  Administered 2015-11-30: 50 ug via INTRAVENOUS
  Administered 2015-11-30 (×3): 25 ug via INTRAVENOUS

## 2015-11-30 MED ORDER — ESTRADIOL 0.1 MG/GM VA CREA
TOPICAL_CREAM | VAGINAL | Status: DC | PRN
  Administered 2015-11-30: 1 via VAGINAL

## 2015-11-30 MED ORDER — HYDROMORPHONE HCL 1 MG/ML IJ SOLN
1.0000 mg | Freq: Once | INTRAMUSCULAR | Status: AC
  Administered 2015-11-30: 1 mg via INTRAVENOUS
  Filled 2015-11-30: qty 1

## 2015-11-30 MED ORDER — ONDANSETRON HCL 4 MG/2ML IJ SOLN
INTRAMUSCULAR | Status: DC | PRN
  Administered 2015-11-30: 4 mg via INTRAVENOUS

## 2015-11-30 MED ORDER — PROPOFOL 10 MG/ML IV BOLUS
INTRAVENOUS | Status: AC
  Filled 2015-11-30: qty 20

## 2015-11-30 MED ORDER — LORAZEPAM 0.5 MG PO TABS
0.5000 mg | ORAL_TABLET | Freq: Once | ORAL | Status: AC
  Administered 2015-11-30: 0.5 mg via SUBLINGUAL
  Filled 2015-11-30: qty 1

## 2015-11-30 MED FILL — Hydromorphone HCl Inj 1 MG/ML: INTRAMUSCULAR | Qty: 1 | Status: AC

## 2015-11-30 SURGICAL SUPPLY — 34 items
BAG URINE DRAINAGE (UROLOGICAL SUPPLIES) ×3 IMPLANT
BNDG CONFORM 2 STRL LF (GAUZE/BANDAGES/DRESSINGS) IMPLANT
CATH FOLEY 2WAY SLVR  5CC 16FR (CATHETERS)
CATH FOLEY 2WAY SLVR  5CC 20FR (CATHETERS) ×2
CATH FOLEY 2WAY SLVR 5CC 16FR (CATHETERS) IMPLANT
CATH FOLEY 2WAY SLVR 5CC 20FR (CATHETERS) ×1 IMPLANT
COVER BACK TABLE 60X90IN (DRAPES) ×3 IMPLANT
DRAPE LG THREE QUARTER DISP (DRAPES) ×3 IMPLANT
DRAPE UNDERBUTTOCKS STRL (DRAPE) ×3 IMPLANT
GLOVE BIO SURGEON STRL SZ7.5 (GLOVE) ×6 IMPLANT
GOWN STRL REUS W/ TWL LRG LVL3 (GOWN DISPOSABLE) ×2 IMPLANT
GOWN STRL REUS W/TWL LRG LVL3 (GOWN DISPOSABLE) ×4
HOLDER FOLEY CATH W/STRAP (MISCELLANEOUS) ×3 IMPLANT
KIT ROOM TURNOVER WOR (KITS) ×3 IMPLANT
LEGGING LITHOTOMY PAIR STRL (DRAPES) ×3 IMPLANT
PACK BASIN DAY SURGERY FS (CUSTOM PROCEDURE TRAY) ×3 IMPLANT
PACKING VAGINAL (PACKING) IMPLANT
PAD ABD 8X10 STRL (GAUZE/BANDAGES/DRESSINGS) ×3 IMPLANT
PAD OB MATERNITY 4.3X12.25 (PERSONAL CARE ITEMS) IMPLANT
PAD PREP 24X48 CUFFED NSTRL (MISCELLANEOUS) ×3 IMPLANT
PLUG CATH AND CAP STER (CATHETERS) IMPLANT
SET IRRIG Y TYPE TUR BLADDER L (SET/KITS/TRAYS/PACK) IMPLANT
SUT PROLENE 0 SH 30 (SUTURE) IMPLANT
SUT SILK 2 0 30  PSL (SUTURE)
SUT SILK 2 0 30 PSL (SUTURE) IMPLANT
SYR BULB IRRIGATION 50ML (SYRINGE) IMPLANT
SYR CONTROL 10ML LL (SYRINGE) IMPLANT
SYRINGE 10CC LL (SYRINGE) ×3 IMPLANT
TOWEL OR 17X24 6PK STRL BLUE (TOWEL DISPOSABLE) ×6 IMPLANT
TRAY DSU PREP LF (CUSTOM PROCEDURE TRAY) ×3 IMPLANT
TUBE CONNECTING 12'X1/4 (SUCTIONS)
TUBE CONNECTING 12X1/4 (SUCTIONS) IMPLANT
WATER STERILE IRR 3000ML UROMA (IV SOLUTION) IMPLANT
WATER STERILE IRR 500ML POUR (IV SOLUTION) ×3 IMPLANT

## 2015-11-30 NOTE — Progress Notes (Signed)
Patient is now rating pain at a 5/10.  Will continue to monitor.

## 2015-11-30 NOTE — Telephone Encounter (Signed)
spoke w pt confirmed 8/11 lab apt

## 2015-11-30 NOTE — Telephone Encounter (Signed)
-----   Message from Gordy Levan, MD sent at 11/30/2015  4:06 PM EDT ----- Coumadin 10 mg today, 10 mg 8-9, 5 mg 8-10 Continue lovenox those days Repeat INR 8-11. POF and order in.   Will wait on urine culture before other treatment   thanks

## 2015-11-30 NOTE — Progress Notes (Signed)
IV removed intact.  Patient escorted to restroom to clean up per her request.  Patient given discharge instructions and left the clinic with her husband.

## 2015-11-30 NOTE — Interval H&P Note (Signed)
History and Physical Interval Note:  11/30/2015 7:35 AM  Lindsey Hughes  has presented today for surgery, with the diagnosis of EXOCERVIX  The various methods of treatment have been discussed with the patient and family. After consideration of risks, benefits and other options for treatment, the patient has consented to  Procedure(s): TANDEM RING INSERTION (N/A) as a surgical intervention .  The patient's history has been reviewed, patient examined, no change in status, stable for surgery.  I have reviewed the patient's chart and labs.  Questions were answered to the patient's satisfaction.     Gery Pray

## 2015-11-30 NOTE — Progress Notes (Signed)
  Radiation Oncology         [phone] ________________________________  Name: Lindsey Hughes MRN: [id number]  Date: 11/30/2015  DOB: 04-03-1971  CC: Antonieta Iba, MD  HDR BRACHYTHERAPY NOTE  DIAGNOSIS:    ICD-9-CM  ICD-10-CM    1.  Malignant neoplasm of exocervix ([place])  180.1  C53.1      NARRATIVE: The patient was brought to the HDR suite. Identity was confirmed. All relevant records and images related to the planned course of therapy were reviewed. The patient freely provided informed written consent to proceed with treatment after reviewing the details related to the planned course of therapy. The consent form was witnessed and verified by the simulation staff. Then, the patient was set-up in a stable reproducible supine position for radiation therapy. The tandem ring system was accessed and fiducial markers were placed within the tandem and ring.   Simple treatment device note: On the operating room the patient had construction of her custom tandem ring system. She will be treated with a 60 tandem/ring system. The patient had placement of a 60 mm tandem. A cervical ring with a small shielding cap was used for her treatment. A rectal paddle was also part of her custom set up device.  Verification simulation note: An AP and lateral film was obtained through the pelvis area. This was compared to the patient's planning films documenting accurate position of the tandem/ring system for treatment.  High-dose-rate brachytherapy treatment note:  The remote afterloading device was accessed through catheter system and attached to the tandem ring system. Patient then proceeded to undergo her fourth high-dose-rate treatment directed at the proximal vagina. The patient was prescribed a dose of 5.5 gray to be delivered to the mucosal surface. Patient was treated with 2 channels using 19 dwell positions. Treatment time was 439.2 seconds. The patient tolerated the procedure  well. After completion of her therapy, a radiation survey was performed documenting return of the iridium source into the [employer] safe. The patient was then transferred to the nursing suite. She then had removal of the rectal paddle followed by the tandem and ring system. The patient tolerated the removal well.  PLAN: The patient will return for her fifth brachytherapy treatment on 12/07/15.  ________________________________   Blair Promise, PhD, MD  This document serves as a record of services personally performed by Gery Pray, MD. It was created on his behalf by Darcus Austin, a trained medical scribe. The creation of this record is based on the scribe's personal observations and the provider's statements to them. This document has been checked and approved by the attending provider.

## 2015-11-30 NOTE — Anesthesia Procedure Notes (Signed)
Procedure Name: LMA Insertion Date/Time: 11/30/2015 7:38 AM Performed by: Denna Haggard D Pre-anesthesia Checklist: Patient identified, Emergency Drugs available, Suction available and Patient being monitored Patient Re-evaluated:Patient Re-evaluated prior to inductionOxygen Delivery Method: Circle system utilized Preoxygenation: Pre-oxygenation with 100% oxygen Intubation Type: IV induction Ventilation: Mask ventilation without difficulty LMA: LMA inserted LMA Size: 4.0 Number of attempts: 1 Airway Equipment and Method: Bite block Placement Confirmation: positive ETCO2 Tube secured with: Tape Dental Injury: Teeth and Oropharynx as per pre-operative assessment

## 2015-11-30 NOTE — Progress Notes (Signed)
  Radiation Oncology         [phone] ________________________________  Name: Lindsey Hughes MRN: [id number]  Date: 11/30/2015  DOB: 07-24-1970  SIMULATION AND TREATMENT PLANNING NOTE HDR BRACHYTHERAPY  DIAGNOSIS: Stage IIB poorly differentiated squamous cell carcinoma of the cervix  NARRATIVE:  The patient was brought to the [place] suite.  Identity was confirmed.  All relevant records and images related to the planned course of therapy were reviewed.  The patient freely provided informed written consent to proceed with treatment after reviewing the details related to the planned course of therapy. The consent form was witnessed and verified by the simulation staff.  Then, the patient was set-up in a stable reproducible  supine position for radiation therapy.  CT images were obtained.  Surface markings were placed.  The CT images were loaded into the planning software.  Then the target and avoidance structures were contoured.  Treatment planning then occurred.  The radiation prescription was entered and confirmed.   I have requested : Brachytherapy Isodose Plan and Dosimetry Calculations to plan the radiation distribution.    PLAN:  The patient will receive 5.5 Gy in 1 fraction. Iridium 192 will be the high-dose-rate source.  ________________________________  Blair Promise, PhD, MD  This document serves as a record of services personally performed by Gery Pray, MD. It was created on his behalf by Darcus Austin, a trained medical scribe. The creation of this record is based on the scribe's personal observations and the provider's statements to them. This document has been checked and approved by the attending provider.

## 2015-11-30 NOTE — Telephone Encounter (Signed)
Spoke with [name] regarding coumadin and lovenox dosing as noted below by Dr. Marko Plume. Scheduling will call her to set up an appointment for repeat PT/INR for Friday 12-03-15. [name] verbalized understanding.

## 2015-11-30 NOTE — Progress Notes (Signed)
IMMEDIATELY FOLLOWING SURGERY: Do not drive or operate machinery for the first twenty four hours after surgery. Do not make any important decisions for twenty four hours after surgery or while taking narcotic pain medications or sedatives. If you develop intractable nausea and vomiting or a severe headache please notify your doctor immediately.   FOLLOW-UP: You do not need to follow up with anesthesia unless specifically instructed to do so.   WOUND CARE INSTRUCTIONS (if applicable): Expect some mild vaginal bleeding, but if large amount of bleeding occurs please contact Dr. Sondra Come at [phone] or the Radiation On-Call physician. Call for any fever greater than 101.0 degrees or increasing vaginal//abdominal pain or trouble urinating.   QUESTIONS?: Please feel free to call your physician or the hospital operator if you have any questions, and they will be happy to assist you. Resume all medications: as listed on your after visit summary. Your next appointment is:  Future Appointments Date Time Provider [place]  12/06/2015 8:15 AM [facility] LAB 3 [facility] None  12/06/2015 8:30 AM [facility] FLUSH NURSE [facility] None  12/06/2015 9:00 AM Lennis Marion Downer, MD [facility] None  12/07/2015 6:30 AM [facility] 1 [facility] [place]  12/07/2015 9:00 AM Gery Pray, MD [facility] None  12/07/2015 1:00 PM [facility] HDR SUITE [facility] None

## 2015-11-30 NOTE — H&P (View-Only) (Signed)
Radiation Oncology         [phone] ________________________________  History and physical examination  Name: Lindsey Hughes MRN: [id number]  Date: 10/06/2015  DOB: 11-May-1970  [id number], Anderson Malta L, PA-C  No ref. 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   HISTORY OF PRESENT ILLNESS::Lindsey Hughes is a 45 y.o. 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 Patient is currently undergoing external beam radiation therapy along with radiosensitizing chemotherapy. Her pelvic and low back pain has improved with her treatments as well as her vaginal bleeding. Patient is now taken to the operating room for her first brachytherapy procedure using iridium 192 with the tandem ring system Dr. Denman George will be present for exam under anesthesia and  placement of cervical sleeve for multiple tandem ring insertions.   PAST MEDICAL HISTORY:  has a past medical history of Hypothyroidism; Anemia; Depression; Headache; Radiation; GERD (gastroesophageal reflux disease); Chemotherapy-induced nausea; Chemotherapy induced diarrhea; Port-a-cath in place; History of thrombophlebitis (RESOLVED-- but per pt prefer no bp or  puncture done in right arm); and Cervical cancer [facility]) (oncologist-  dr livesay/  dr Zendayah Hardgrave/  dr Denman George).    PAST SURGICAL HISTORY: Past Surgical History  Procedure Laterality Date  . Cesarean section  1991   . Carpal tunnel release Bilateral 2002 and 2009  . Lasik Left 2015  . Dilation and curettage of uterus N/A 09/03/2015    Procedure: EXAM UNDER ANESTHESIA  WITH FROZEN SECTION CERVICAL BIOPSY & ENDOCERVICAL CURRETTINGS;  Surgeon: Aloha Gell, MD;  Location: [facility];  Service: Gynecology;  Laterality: N/A;  . Tubal ligation  1996  . Portacath placement  10-11-2015  . Echo stress test  12-10-2014    normal w/ no evidence for  stress-induced ischemia/  normal LV function and wall motion , ef 65-70%    FAMILY HISTORY: family history includes Brain cancer in her father; CAD in her brother and mother; Lung cancer in her father and mother.  SOCIAL HISTORY:  reports that she has never smoked. She has never used smokeless tobacco. She reports that she does not drink alcohol or use illicit drugs.  ALLERGIES: Zofran  MEDICATIONS:  No current facility-administered medications for this encounter.   Current Outpatient Prescriptions  Medication Sig Dispense Refill  . acetaminophen (TYLENOL) 500 MG tablet Take 500 mg by mouth every 6 (six) hours as needed. Reported on 11/02/2015    . ALPRAZolam (XANAX) 0.25 MG tablet Take 0.25 mg by mouth as needed for anxiety. Reported on 10/21/2015    . ibuprofen (ADVIL,MOTRIN) 800 MG tablet Take 800 mg by mouth every 8 (eight) hours as needed for mild pain or moderate pain. Reported on 11/02/2015  4  . lidocaine-prilocaine (EMLA) cream Apply  1-2 hrs prior to Portacath access prn. 30 g 1  . loperamide (IMODIUM) 1 MG/5ML solution Take by mouth as needed for diarrhea or loose stools. Reported on 10/29/2015    . LORazepam (ATIVAN) 1 MG tablet Place 1/2 -1 tabled under the tongue or swallow every 6 hrs as needed for nausea.  Will make drowsy. 20 tablet 0  . magic mouthwash SOLN Take 5-10 mLs by mouth every 4 (four) hours as needed for mouth pain. 1:1 ratio 2% viscous lidocaine;benadryl, and nystatin 480 mL 1  . oxyCODONE-acetaminophen (ROXICET) 5-325 MG tablet Take 1 tablet by mouth every 4 (four) hours as needed for severe pain. Oxycodone/percocet/roxicet,  30 tablets, no refills 12 tablet 0  . pantoprazole (PROTONIX) 40 MG tablet Take 1 tablet (40 mg total) by mouth daily. (Patient taking differently: Take 40 mg by mouth every morning. ) 30 tablet 2  . prochlorperazine (COMPAZINE) 10 MG tablet Take 1 tablet (10 mg total) by mouth every 6 (six) hours as needed for nausea or vomiting. 30 tablet 1  .  triamcinolone (KENALOG) 0.1 % paste Apply to mouth ulers twice a day. (Patient taking differently: as needed. Apply to mouth ulers twice a day.) 5 g 1  . levothyroxine (SYNTHROID, LEVOTHROID) 50 MCG tablet Take 50 mcg by mouth daily before breakfast. Reported on 11/02/2015--  Pt per last dose 09-24-2015  approx.     Facility-Administered Medications Ordered in Other Encounters  Medication Dose Route Frequency Provider Last Rate Last Dose  . famotidine (PEPCID) IVPB 20 mg premix  20 mg Intravenous Q12H Lennis P Livesay, MD   20 mg at 10/07/15 1328    REVIEW OF SYSTEMS:  A 15 point review of systems is documented in the electronic medical record. This was obtained by the nursing staff. However, I reviewed this with the patient to discuss relevant findings and make appropriate changes. Patient has had some nausea with her Radiation and chemotherapy. She has also had some diarrhea with her treatment. Dysuria is also one of her side effects    PHYSICAL EXAM:  height is 5\' 2"  (1.575 m) and weight is 207 lb 6.4 oz (94.076 kg). Her oral temperature is 98.2 F (36.8 C). Her blood pressure is 126/84 and her pulse is 81. Her respiration is 16 and oxygen saturation is 100%.  General: Alert and oriented, in no acute distress HEENT: Head is normocephalic. Extraocular movements are intact. Oropharynx is clear. Teeth in good repair Neck: Neck is supple, no palpable cervical or supraclavicular lymphadenopathy. Heart: Regular in rate and rhythm with no murmurs, rubs, or gallops. Chest: Clear to auscultation bilaterally, with no rhonchi, wheezes, or rales. Abdomen: Soft, nontender, nondistended, with no rigidity or guarding. Extremities: No cyanosis or edema. Lymphatics: see Neck Exam Skin: No concerning lesions. Musculoskeletal: symmetric strength and muscle tone throughout. Neurologic: Cranial nerves II through XII are grossly intact. No obvious focalities. Speech is fluent. Coordination is  intact. Psychiatric: Judgment and insight are intact. Affect is appropriate. Pelvic exam deferred until operating room procedure   LABORATORY DATA:  Lab Results  Component Value Date   WBC 2.0* 11/08/2015   HGB 10.1* 11/08/2015   HCT 29.5* 11/08/2015   MCV 84.5 11/08/2015   PLT 100* 11/08/2015   NEUTROABS 1.4* 11/08/2015   Lab Results  Component Value Date   NA 139 11/08/2015   K 3.6 11/08/2015   CL 104 10/11/2015   CO2 25 11/08/2015   GLUCOSE 113 11/08/2015   CREATININE 0.8 11/08/2015   CALCIUM 9.0 11/08/2015      RADIOGRAPHY: Ir Fluoro Guide Cv Line Right  10/11/2015  INDICATION: 45 year old with cervical cancer. Port-A-Cath needed for chemotherapy. EXAM: FLUOROSCOPIC AND ULTRASOUND GUIDED PLACEMENT OF A SUBCUTANEOUS PORT COMPARISON:  None. MEDICATIONS: Ancef 2 g; The antibiotic was administered within an appropriate time interval prior to skin puncture. ANESTHESIA/SEDATION: Versed 3.0 mg IV; Fentanyl 150 mcg IV; Moderate Sedation Time:  35 minutes The patient was continuously monitored during the procedure by the interventional radiology nurse under my direct supervision. FLUOROSCOPY TIME:  24 seconds (4 mGy) COMPLICATIONS: None immediate. PROCEDURE: The procedure, risks, benefits, and alternatives were explained to the patient. Questions regarding the procedure  were encouraged and answered. The patient understands and consents to the procedure. Patient was placed supine on the interventional table. Ultrasound confirmed a patent right internal jugular vein. The right chest and neck were cleaned with a skin antiseptic and a sterile drape was placed. Maximal barrier sterile technique was utilized including caps, mask, sterile gowns, sterile gloves, sterile drape, hand hygiene and skin antiseptic. The right neck was anesthetized with 1% lidocaine. Small incision was made in the right neck with a blade. Micropuncture set was placed in the right internal jugular vein with ultrasound  guidance. The micropuncture wire was used for measurement purposes. The right chest was anesthetized with 1% lidocaine with epinephrine. #15 blade was used to make an incision and a subcutaneous port pocket was formed. [place] was assembled. Subcutaneous tunnel was formed with a stiff tunneling device. The port catheter was brought through the subcutaneous tunnel. The port was placed in the subcutaneous pocket. The micropuncture set was exchanged for a peel-away sheath. The catheter was placed through the peel-away sheath and the tip was positioned at the superior cavoatrial junction. Catheter placement was confirmed with fluoroscopy. The port was accessed and flushed with heparinized saline. The port pocket was closed using two layers of absorbable sutures and Dermabond. The vein skin site was closed using a single layer of absorbable suture and Dermabond. Sterile dressings were applied. Patient tolerated the procedure well without an immediate complication. Ultrasound and fluoroscopic images were taken and saved for this procedure. IMPRESSION: Placement of a subcutaneous port device. Catheter tip at the superior cavoatrial junction. Electronically Signed   By: Markus Daft M.D.   On: 10/11/2015 14:52   Ir US Guide Vasc Access Right  10/11/2015  INDICATION: 45 year old with cervical cancer. Port-A-Cath needed for chemotherapy. EXAM: FLUOROSCOPIC AND ULTRASOUND GUIDED PLACEMENT OF A SUBCUTANEOUS PORT COMPARISON:  None. MEDICATIONS: Ancef 2 g; The antibiotic was administered within an appropriate time interval prior to skin puncture. ANESTHESIA/SEDATION: Versed 3.0 mg IV; Fentanyl 150 mcg IV; Moderate Sedation Time:  35 minutes The patient was continuously monitored during the procedure by the interventional radiology nurse under my direct supervision. FLUOROSCOPY TIME:  24 seconds (4 mGy) COMPLICATIONS: None immediate. PROCEDURE: The procedure, risks, benefits, and alternatives were explained to the  patient. Questions regarding the procedure were encouraged and answered. The patient understands and consents to the procedure. Patient was placed supine on the interventional table. Ultrasound confirmed a patent right internal jugular vein. The right chest and neck were cleaned with a skin antiseptic and a sterile drape was placed. Maximal barrier sterile technique was utilized including caps, mask, sterile gowns, sterile gloves, sterile drape, hand hygiene and skin antiseptic. The right neck was anesthetized with 1% lidocaine. Small incision was made in the right neck with a blade. Micropuncture set was placed in the right internal jugular vein with ultrasound guidance. The micropuncture wire was used for measurement purposes. The right chest was anesthetized with 1% lidocaine with epinephrine. #15 blade was used to make an incision and a subcutaneous port pocket was formed. [place] was assembled. Subcutaneous tunnel was formed with a stiff tunneling device. The port catheter was brought through the subcutaneous tunnel. The port was placed in the subcutaneous pocket. The micropuncture set was exchanged for a peel-away sheath. The catheter was placed through the peel-away sheath and the tip was positioned at the superior cavoatrial junction. Catheter placement was confirmed with fluoroscopy. The port was accessed and flushed with heparinized saline. The port  pocket was closed using two layers of absorbable sutures and Dermabond. The vein skin site was closed using a single layer of absorbable suture and Dermabond. Sterile dressings were applied. Patient tolerated the procedure well without an immediate complication. Ultrasound and fluoroscopic images were taken and saved for this procedure. IMPRESSION: Placement of a subcutaneous port device. Catheter tip at the superior cavoatrial junction. Electronically Signed   By: Markus Daft M.D.   On: 10/11/2015 14:52      IMPRESSION: Stage II-B squamous cell  carcinoma cervix. The patient is now ready to proceed with brachytherapy as part of her overall management.  PLAN: Patient will proceed to the operating room for scheduled case on July 18 at 7:15 AM.    ------------------------------------------------  Blair Promise, PhD, MD

## 2015-11-30 NOTE — Anesthesia Postprocedure Evaluation (Signed)
Anesthesia Post Note  Patient: Akiera Guier  Procedure(s) Performed: Procedure(s) (LRB): TANDEM RING INSERTION (N/A)  Patient location during evaluation: PACU Anesthesia Type: General Level of consciousness: awake and alert Pain management: pain level controlled Vital Signs Assessment: post-procedure vital signs reviewed and stable Respiratory status: spontaneous breathing, nonlabored ventilation, respiratory function stable and patient connected to nasal cannula oxygen Cardiovascular status: blood pressure returned to baseline and stable Postop Assessment: no signs of nausea or vomiting Anesthetic complications: no    Last Vitals:  Vitals:   11/30/15 0608 11/30/15 0815  BP: 109/66 118/74  Pulse: 90   Resp: 16   Temp: 36.7 C 36.7 C    Last Pain:  Vitals:   11/30/15 0845  TempSrc:   PainSc: 5                  Cristy Colmenares S

## 2015-11-30 NOTE — Brief Op Note (Signed)
11/30/2015  8:40 AM  PATIENT:  Lindsey Hughes Hughes  45 y.o. female  PRE-OPERATIVE DIAGNOSIS:  EXOCERVIX  POST-OPERATIVE DIAGNOSIS:  EXOCERVIX  PROCEDURE:  Procedure(s): TANDEM RING INSERTION (N/A)  SURGEON:  Surgeon(s) and Role:    * Gery Pray, MD - Primary  PHYSICIAN ASSISTANT:   ASSISTANTS: none   ANESTHESIA:   general  EBL:  Total I/O In: 500 [I.V.:500] Out: 0   BLOOD ADMINISTERED:none  DRAINS: Urinary Catheter (Foley)   LOCAL MEDICATIONS USED:  NONE  SPECIMEN:  No Specimen  DISPOSITION OF SPECIMEN:  N/A  COUNTS:  YES  TOURNIQUET:  * No tourniquets in log *  DICTATION: The patient was taken to outpatient OR #1. Timeout was performed before the procedure was initiated. The perineum and vaginal cavity was prepped by OR staff. A 20 French Foley catheter was  placed during the procedure. . On exam under anesthesia the patient was noted to have excellent response to her external beam and radiosensitizing chemotherapy. The cervical mass was actually noted to be along the anterior lip of the cervix and had decreased in size from approximately 6 cm down to 2 cm on clinical exam. There is no further parametrial extension noted. .The uterus was noted to be in an anteverted position.The cervical sleeve was noted to be sewn in good position along the cervical os. Patient then had placement of a 60 mm, 60tandem within the cervical and endometrial cavity within the cervical sleeve. A 60 tandem was chosen this time to potentially lower dose to the bladder.  Patient then had placement of a 60ring with smallshielding capIn place. Patient then had placement of a rectal paddle to shield the anterior rectum from high-dose radiation. Estrace cream was placed within the vaginal vault and perineum to aid in implant removal and for comfort issues. Patient was subsequently transported to the recovery room in stable condition. Later in the day the patient will undergo planning and her  fourthhigh-dose-rate treatment with iridium 192 as the high-dose-rate source. The patient will receive approximately 5.5 Gy to the clinical target volume.  PLAN OF CARE: Transferred to radiation oncology for planning and treatment  PATIENT DISPOSITION:  PACU - hemodynamically stable.   Delay start of Pharmacological VTE agent (>24hrs) due to surgical blood loss or risk of bleeding: not applicable

## 2015-11-30 NOTE — Progress Notes (Signed)
Received report from [place], [place] in PACU.  Patient has a foley catheter with yellow urine.  She has a #20 gauge IV in her right hand.  Transported to CT SIM via stretcher.

## 2015-11-30 NOTE — Anesthesia Preprocedure Evaluation (Signed)
Anesthesia Evaluation  Patient identified by MRN, date of birth, ID band Patient awake    Reviewed: Allergy & Precautions, NPO status , Patient's Chart, lab work & pertinent test results  Airway Mallampati: II  TM Distance: >3 FB Neck ROM: Full    Dental no notable dental hx.    Pulmonary neg pulmonary ROS,    Pulmonary exam normal breath sounds clear to auscultation       Cardiovascular + DVT  Normal cardiovascular exam Rhythm:Regular Rate:Normal     Neuro/Psych negative neurological ROS  negative psych ROS   GI/Hepatic negative GI ROS, Neg liver ROS, GERD  ,  Endo/Other  Hypothyroidism   Renal/GU negative Renal ROS  negative genitourinary   Musculoskeletal negative musculoskeletal ROS (+)   Abdominal   Peds negative pediatric ROS (+)  Hematology negative hematology ROS (+)   Anesthesia Other Findings   Reproductive/Obstetrics negative OB ROS                             Anesthesia Physical Anesthesia Plan  ASA: III  Anesthesia Plan: General   Post-op Pain Management:    Induction: Intravenous  Airway Management Planned: LMA and Oral ETT  Additional Equipment:   Intra-op Plan:   Post-operative Plan: Extubation in OR  Informed Consent: I have reviewed the patients History and Physical, chart, labs and discussed the procedure including the risks, benefits and alternatives for the proposed anesthesia with the patient or authorized representative who has indicated his/her understanding and acceptance.   Dental advisory given  Plan Discussed with: CRNA and Surgeon  Anesthesia Plan Comments:         Anesthesia Quick Evaluation

## 2015-11-30 NOTE — Progress Notes (Signed)
Foley catheter removed intact.

## 2015-11-30 NOTE — Transfer of Care (Signed)
  Last Vitals:  Vitals:   11/30/15 0608 11/30/15 0815  BP: 109/66   Pulse: 90   Resp: 16   Temp: 36.7 C (P) 36.7 C    Immediate Anesthesia Transfer of Care Note  Patient: Lindsey Hughes  Procedure(s) Performed: Procedure(s) (LRB): TANDEM RING INSERTION (N/A)  Patient Location: PACU  Anesthesia Type: General  Level of Consciousness: awake, oriented, sedated and patient cooperative  Airway & Oxygen Therapy: Patient Spontanous Breathing and Patient connected to face mask oxygen  Post-op Assessment: Report given to PACU RN and Post -op Vital signs reviewed and stable  Post vital signs: Reviewed and stable  Complications: No apparent anesthesia complications

## 2015-12-01 ENCOUNTER — Encounter (HOSPITAL_BASED_OUTPATIENT_CLINIC_OR_DEPARTMENT_OTHER): Payer: Self-pay | Admitting: Radiation Oncology

## 2015-12-01 LAB — URINE CULTURE: ORGANISM ID, BACTERIA: NO GROWTH

## 2015-12-02 ENCOUNTER — Telehealth: Payer: Self-pay

## 2015-12-02 NOTE — Telephone Encounter (Signed)
S/w pt that her urine culture showed no growth. She states she is not having symptoms like she was. Reconfirmed tomorrow's lab appt.

## 2015-12-03 ENCOUNTER — Ambulatory Visit (HOSPITAL_COMMUNITY)
Admission: RE | Admit: 2015-12-03 | Discharge: 2015-12-03 | Disposition: A | Discharge status: Home / Self Care | Payer: BLUE CROSS/BLUE SHIELD | Source: Ambulatory Visit | Attending: Nurse Practitioner | Admitting: Nurse Practitioner

## 2015-12-03 ENCOUNTER — Other Ambulatory Visit (HOSPITAL_BASED_OUTPATIENT_CLINIC_OR_DEPARTMENT_OTHER): Payer: BLUE CROSS/BLUE SHIELD

## 2015-12-03 ENCOUNTER — Encounter (HOSPITAL_COMMUNITY): Payer: Self-pay

## 2015-12-03 ENCOUNTER — Ambulatory Visit (HOSPITAL_BASED_OUTPATIENT_CLINIC_OR_DEPARTMENT_OTHER): Payer: BLUE CROSS/BLUE SHIELD | Admitting: Nurse Practitioner

## 2015-12-03 ENCOUNTER — Telehealth: Payer: Self-pay

## 2015-12-03 ENCOUNTER — Encounter (HOSPITAL_BASED_OUTPATIENT_CLINIC_OR_DEPARTMENT_OTHER): Payer: Self-pay | Admitting: *Deleted

## 2015-12-03 VITALS — BP 116/82 | HR 94 | Temp 98.6°F | Resp 18 | Ht 62.0 in | Wt 206.3 lb

## 2015-12-03 DIAGNOSIS — N390 Urinary tract infection, site not specified: Secondary | ICD-10-CM

## 2015-12-03 DIAGNOSIS — R221 Localized swelling, mass and lump, neck: Secondary | ICD-10-CM

## 2015-12-03 DIAGNOSIS — E876 Hypokalemia: Secondary | ICD-10-CM

## 2015-12-03 DIAGNOSIS — Z7901 Long term (current) use of anticoagulants: Secondary | ICD-10-CM | POA: Diagnosis not present

## 2015-12-03 DIAGNOSIS — C531 Malignant neoplasm of exocervix: Secondary | ICD-10-CM | POA: Diagnosis not present

## 2015-12-03 DIAGNOSIS — C539 Malignant neoplasm of cervix uteri, unspecified: Secondary | ICD-10-CM | POA: Insufficient documentation

## 2015-12-03 DIAGNOSIS — A499 Bacterial infection, unspecified: Secondary | ICD-10-CM

## 2015-12-03 DIAGNOSIS — I82621 Acute embolism and thrombosis of deep veins of right upper extremity: Secondary | ICD-10-CM | POA: Diagnosis not present

## 2015-12-03 LAB — COMPREHENSIVE METABOLIC PANEL
ALBUMIN: 3.3 g/dL — AB (ref 3.5–5.0)
ALK PHOS: 70 U/L (ref 40–150)
ALT: 22 U/L (ref 0–55)
AST: 16 U/L (ref 5–34)
Anion Gap: 12 mEq/L — ABNORMAL HIGH (ref 3–11)
BUN: 12.5 mg/dL (ref 7.0–26.0)
CALCIUM: 9.3 mg/dL (ref 8.4–10.4)
CHLORIDE: 103 meq/L (ref 98–109)
CO2: 27 mEq/L (ref 22–29)
Creatinine: 0.9 mg/dL (ref 0.6–1.1)
EGFR: 82 mL/min/{1.73_m2} — AB (ref >90)
Glucose: 102 mg/dl (ref 70–140)
POTASSIUM: 3.1 meq/L — AB (ref 3.5–5.1)
SODIUM: 143 meq/L (ref 136–145)
Total Bilirubin: <0.3 mg/dL (ref 0.20–1.20)
Total Protein: 6.8 g/dL (ref 6.4–8.3)

## 2015-12-03 LAB — PROTIME-INR
INR: 2.1 (ref 2.00–3.50)
PROTIME: 25.2 s — AB (ref 10.6–13.4)

## 2015-12-03 LAB — CBC WITH DIFFERENTIAL/PLATELET
BASO%: 0.5 % (ref 0.0–2.0)
BASOS ABS: 0 10*3/uL (ref 0.0–0.1)
EOS ABS: 0 10*3/uL (ref 0.0–0.5)
EOS%: 0.3 % (ref 0.0–7.0)
HEMATOCRIT: 32.7 % — AB (ref 34.8–46.6)
HGB: 10.8 g/dL — ABNORMAL LOW (ref 11.6–15.9)
LYMPH%: 9.4 % — ABNORMAL LOW (ref 14.0–49.7)
MCH: 30.1 pg (ref 25.1–34.0)
MCHC: 33.2 g/dL (ref 31.5–36.0)
MCV: 90.8 fL (ref 79.5–101.0)
MONO#: 0.4 10*3/uL (ref 0.1–0.9)
MONO%: 12.4 % (ref 0.0–14.0)
NEUT#: 2.5 10*3/uL (ref 1.5–6.5)
NEUT%: 77.4 % — AB (ref 38.4–76.8)
Platelets: 254 10*3/uL (ref 145–400)
RBC: 3.6 10*6/uL — ABNORMAL LOW (ref 3.70–5.45)
RDW: 22.3 % — ABNORMAL HIGH (ref 11.2–14.5)
WBC: 3.2 10*3/uL — ABNORMAL LOW (ref 3.9–10.3)
lymph#: 0.3 10*3/uL — ABNORMAL LOW (ref 0.9–3.3)

## 2015-12-03 LAB — MAGNESIUM: MAGNESIUM: 1.8 mg/dL (ref 1.5–2.5)

## 2015-12-03 MED ORDER — IOPAMIDOL (ISOVUE-300) INJECTION 61%
75.0000 mL | Freq: Once | INTRAVENOUS | Status: AC | PRN
  Administered 2015-12-03: 75 mL via INTRAVENOUS

## 2015-12-03 NOTE — Progress Notes (Signed)
NPO AFTER MN.  ARRIVE AT 0600.  CURRENT LAB RESULTS IN CHART AND EPIC.  WILL TAKE PROTONIX AM DOS W/ SIPS OF WATER.  THIS IS PT'S LAST TREATMENT.

## 2015-12-03 NOTE — Telephone Encounter (Addendum)
Lindsey Hughes here for follow up PT/INR for right  arm DVT. INR is 2.10 today. She cc of increased facial and neck swelling the last couple of days.  She has experience some difficulty swallowing as well.  She feels that there is something pushing on her throat when she is swallowing.  She is not SOB. Speech is normal Right neck and throat look fuller from last visit 11-30-15. The right forearm is not swollen.  Pt to see Selena Lesser, NP in symptom management clinic to be evaluated.

## 2015-12-05 ENCOUNTER — Other Ambulatory Visit: Payer: Self-pay | Admitting: Oncology

## 2015-12-05 DIAGNOSIS — I82621 Acute embolism and thrombosis of deep veins of right upper extremity: Secondary | ICD-10-CM

## 2015-12-06 ENCOUNTER — Other Ambulatory Visit: Payer: BLUE CROSS/BLUE SHIELD

## 2015-12-06 ENCOUNTER — Other Ambulatory Visit: Payer: Self-pay | Admitting: Nurse Practitioner

## 2015-12-06 ENCOUNTER — Telehealth: Payer: Self-pay | Admitting: Nurse Practitioner

## 2015-12-06 ENCOUNTER — Ambulatory Visit: Payer: BLUE CROSS/BLUE SHIELD | Admitting: Oncology

## 2015-12-06 ENCOUNTER — Encounter: Payer: Self-pay | Admitting: Nurse Practitioner

## 2015-12-06 ENCOUNTER — Other Ambulatory Visit: Payer: Self-pay | Admitting: Oncology

## 2015-12-06 ENCOUNTER — Telehealth: Payer: Self-pay | Admitting: Oncology

## 2015-12-06 DIAGNOSIS — E876 Hypokalemia: Secondary | ICD-10-CM | POA: Insufficient documentation

## 2015-12-06 DIAGNOSIS — Z7901 Long term (current) use of anticoagulants: Secondary | ICD-10-CM | POA: Insufficient documentation

## 2015-12-06 DIAGNOSIS — R221 Localized swelling, mass and lump, neck: Secondary | ICD-10-CM | POA: Insufficient documentation

## 2015-12-06 DIAGNOSIS — I82621 Acute embolism and thrombosis of deep veins of right upper extremity: Secondary | ICD-10-CM

## 2015-12-06 MED ORDER — POTASSIUM CHLORIDE CRYS ER 20 MEQ PO TBCR: EXTENDED_RELEASE_TABLET | ORAL | 0 refills | Status: DC

## 2015-12-06 NOTE — Progress Notes (Signed)
SYMPTOM MANAGEMENT CLINIC    Chief Complaint: Lump in throat  HPI:  Lindsey Hughes 45 y.o. female diagnosed with cervical cancer.  Patient is status post chemotherapy.  She just recently completed external radiation treatments; has one session of internal brachytherapy left.  Patient presented to the [facility] today with complaint of a lump in her central throat.  She denies any other new symptoms.  She denies any recent fevers or chills.   No history exists.    Review of Systems  HENT:       Complain of sensation of lump in throat.  All other systems reviewed and are negative.   Past Medical History:  Diagnosis Date  . Anemia   . Anticoagulated on Coumadin    started 11-26-2015 for right upper arm deep and superficial vein thrombus  . Cervical cancer [facility]) oncologist-  dr livesay/  dr kinard/  dr Denman George   dx 08/2015-- Stage IIB poorly differentiated SCC--  chemotherapy weekly (started 10-04-2015)  and external radiation (started 10-05-2015) and planned high-dose radiation via tandom ring direct to cervix to start 11-09-2015  . Chemotherapy induced diarrhea   . Chemotherapy-induced nausea   . Deep vein thrombosis, upper right extremity ([place]) 11-25-2015 dx   deep and superficial involving axillary vein, brachial vein, and basilic vein  . Depression   . Dysuria   . GERD (gastroesophageal reflux disease)    caused by chemotherapy  . Headache   . History of thrombophlebitis RESOLVED-- but per pt prefer no bp or  puncture done in right arm   per pt symptoms 10-20-2015-- on 10-22-2015 dx Superficial thrombus RUE of right basilic vein,where IV access was difficult for first peripheral chemo (not deep venous system) / symptoms resolved with local measures  . Hypothyroidism   . Port-a-cath in place   . Radiation    currently having radiation therapy started 10-04-2015  . Superficial thrombophlebitis of arm 11/25/2015   right upper arm-- secondary to IV access difficult for  first chemo    Past Surgical History:  Procedure Laterality Date  . CARPAL TUNNEL RELEASE Bilateral 2002 and 2009  . CESAREAN SECTION  1991   . DILATION AND CURETTAGE OF UTERUS N/A 09/03/2015   Procedure: EXAM UNDER ANESTHESIA  WITH FROZEN SECTION CERVICAL BIOPSY & ENDOCERVICAL CURRETTINGS;  Surgeon: Aloha Gell, MD;  Location: [facility];  Service: Gynecology;  Laterality: N/A;  . ECHO STRESS TEST  12-10-2014   normal w/ no evidence for stress-induced ischemia/  normal LV function and wall motion , ef 65-70%  . IR GENERIC HISTORICAL  10/25/2015   IR RADIOLOGIST EVAL & MGMT 10/25/2015 Corrie Mckusick, DO [facility]INTERV RAD  . LASIK Left 2015  . PORTACATH PLACEMENT  10-11-2015  . TANDEM RING INSERTION N/A 11/09/2015   Procedure: TANDEM RING INSERTION;  Surgeon: Gery Pray, MD;  Location[facility];  Service: Urology;  Laterality: N/A;  . TANDEM RING INSERTION N/A 11/16/2015   Procedure: TANDEM RING INSERTION;  Surgeon: Gery Pray, MD;  Location: [facility];  Service: Urology;  Laterality: N/A;  . TANDEM RING INSERTION N/A 11/23/2015   Procedure: TANDEM RING INSERTION;  Surgeon: Gery Pray, MD;  Location: [facility];  Service: Urology;  Laterality: N/A;  . TANDEM RING INSERTION N/A 11/30/2015   Procedure: TANDEM RING INSERTION;  Surgeon: Gery Pray, MD;  Location: [facility];  Service: Urology;  Laterality: N/A;  . [place]    has Malignant neoplasm of  exocervix ([place]); Obesity (BMI 35.0-39.9 without comorbidity) ([place]); History of bilateral tubal ligation; Vaginal bleeding, abnormal; Thyroid activity decreased; Urinary frequency; Superficial thrombophlebitis of arm; Portacath in place; UTI (lower urinary tract infection); Deep vein thrombosis (DVT) of right upper extremity ([place]); Long term current use of anticoagulant therapy; Sensation of lump in throat; and Hypokalemia on her problem list.    is allergic to zofran  [ondansetron hcl].    Medication List       Accurate as of 12/03/15 11:59 PM. Always use your most recent med list.          acetaminophen 500 MG tablet Commonly known as:  TYLENOL Take 500 mg by mouth every 6 (six) hours as needed. Reported on 11/02/2015   ALPRAZolam 0.25 MG tablet Commonly known as:  XANAX Take 0.25 mg by mouth as needed for anxiety. Reported on 10/21/2015   diphenoxylate-atropine 2.5-0.025 MG tablet Commonly known as:  LOMOTIL Take 2 tablets by mouth 4 (four) times daily as needed for diarrhea or loose stools.   enoxaparin 150 MG/ML injection Commonly known as:  LOVENOX Inject 0.93 mLs (140 mg total) into the skin daily.   hydrocortisone 2.5 % rectal cream Commonly known as:  ANUSOL-HC Place 1 application rectally 2 (two) times daily. Called in to [employer] on 11/23/15.   hydrocortisone 25 MG suppository Commonly known as:  ANUSOL-HC Place 25 mg rectally 2 (two) times daily. Called in to [employer] on 11/23/15. Disp. 12 suppositories.   ibuprofen 800 MG tablet Commonly known as:  ADVIL,MOTRIN Take 800 mg by mouth every 8 (eight) hours as needed for mild pain or moderate pain. Reported on 11/02/2015   lidocaine 2 % jelly Commonly known as:  XYLOCAINE Apply 1 application topically as needed.   lidocaine-prilocaine cream Commonly known as:  EMLA Apply  1-2 hrs prior to Portacath access prn.   loperamide 1 MG/5ML solution Commonly known as:  IMODIUM Take by mouth as needed for diarrhea or loose stools. Reported on 10/29/2015   LORazepam 1 MG tablet Commonly known as:  ATIVAN Place 1/2 -1 tabled under the tongue or swallow every 6 hrs as needed for nausea.  Will make drowsy.   magic mouthwash Soln Take 5-10 mLs by mouth every 4 (four) hours as needed for mouth pain. 1:1 ratio 2% viscous lidocaine;benadryl, and nystatin   oxyCODONE-acetaminophen 5-325 MG tablet Commonly known as:  ROXICET Take 1 tablet by mouth every 4 (four) hours  as needed for severe pain. Oxycodone/percocet/roxicet, 30 tablets, no refills   pantoprazole 40 MG tablet Commonly known as:  PROTONIX Take 1 tablet (40 mg total) by mouth daily.   prochlorperazine 10 MG tablet Commonly known as:  COMPAZINE Take 1 tablet (10 mg total) by mouth every 6 (six) hours as needed for nausea or vomiting.   SONAFINE Apply 1 application topically 2 (two) times daily.   triamcinolone 0.1 % paste Commonly known as:  KENALOG Apply to mouth ulers twice a day.   warfarin 5 MG tablet Commonly known as:  COUMADIN Take 2 tablet by mouth daily beginning 11-26-15 and as directed        PHYSICAL EXAMINATION  Oncology Vitals 12/03/2015 11/30/2015  Height 158 cm -  Weight 93.577 kg -  Weight (lbs) 206 lbs 5 oz -  BMI (kg/m2) 37.73 kg/m2 -  Temp 98.6 -  Pulse 94 75  Resp 18 -  SpO2 100 -  BSA (m2) 2.02 m2 -   BP Readings from Last 2  Encounters:  12/03/15 116/82  11/30/15 125/83    Physical Exam  Constitutional: She is oriented to person, place, and time and well-developed, well-nourished, and in no distress.  HENT:  Head: Normocephalic and atraumatic.  Mouth/Throat: Oropharynx is clear and moist.  Eyes: Conjunctivae and EOM are normal. Pupils are equal, round, and reactive to light. Right eye exhibits no discharge. Left eye exhibits no discharge. No scleral icterus.  Neck: Normal range of motion. Neck supple. No JVD present. No thyromegaly present.  Exam today revealed.  Patient managing all oral secretions with no difficulty.  She has some mild edema to her entire right upper extremity; it does appear to be extending to the right neck region.  Port-A-Cath intact with no evidence of infection.  With closer exam-it does appear the patient has increased fullness to the left side of her neck only.  There is no mass palpated.  Whatsoever.        Cardiovascular: Normal rate, regular rhythm, normal heart sounds and intact distal pulses.   Pulmonary/Chest: Effort  normal and breath sounds normal. No stridor. No respiratory distress. She has no wheezes. She has no rales. She exhibits no tenderness.  Abdominal: Soft. Bowel sounds are normal. She exhibits no distension and no mass. There is no tenderness. There is no rebound and no guarding.  Musculoskeletal: Normal range of motion. She exhibits edema. She exhibits no tenderness.  Trace edema to the right upper extremity; with mild edema at the right neck region.  Lymphadenopathy:    She has no cervical adenopathy.  Neurological: She is alert and oriented to person, place, and time. Gait normal.  Skin: Skin is warm and dry. No rash noted. No erythema. No pallor.  Psychiatric: Affect normal.  Nursing note and vitals reviewed.   LABORATORY DATA:. Appointment on 12/03/2015  Component Date Value Ref Range Status  . Protime 12/03/2015 25.2* 10.6 - 13.4 Seconds Final  . INR 12/03/2015 2.10  2.00 - 3.50 Final   Comment: INR is useful only to assess adequacy of anticoagulation with coumadin when comparing results from different labs. It should not be used to estimate bleeding risk or presence/abscense of coagulopathy in patients not on coumadin. Expected INR ranges for  nontherapeutic patients is 0.88 - 1.12.   Marland Kitchen Lovenox 12/03/2015 YES   Final  . WBC 12/03/2015 3.2* 3.9 - 10.3 10e3/uL Final  . NEUT# 12/03/2015 2.5  1.5 - 6.5 10e3/uL Final  . HGB 12/03/2015 10.8* 11.6 - 15.9 g/dL Final  . HCT 12/03/2015 32.7* 34.8 - 46.6 % Final  . Platelets 12/03/2015 254  145 - 400 10e3/uL Final  . MCV 12/03/2015 90.8  79.5 - 101.0 fL Final  . MCH 12/03/2015 30.1  25.1 - 34.0 pg Final  . MCHC 12/03/2015 33.2  31.5 - 36.0 g/dL Final  . RBC 12/03/2015 3.60* 3.70 - 5.45 10e6/uL Final  . RDW 12/03/2015 22.3* 11.2 - 14.5 % Final  . lymph# 12/03/2015 0.3* 0.9 - 3.3 10e3/uL Final  . MONO# 12/03/2015 0.4  0.1 - 0.9 10e3/uL Final  . Eosinophils Absolute 12/03/2015 0.0  0.0 - 0.5 10e3/uL Final  . Basophils Absolute 12/03/2015  0.0  0.0 - 0.1 10e3/uL Final  . NEUT% 12/03/2015 77.4* 38.4 - 76.8 % Final  . LYMPH% 12/03/2015 9.4* 14.0 - 49.7 % Final  . MONO% 12/03/2015 12.4  0.0 - 14.0 % Final  . EOS% 12/03/2015 0.3  0.0 - 7.0 % Final  . BASO% 12/03/2015 0.5  0.0 - 2.0 % Final  .  Sodium 12/03/2015 143  136 - 145 mEq/L Final  . Potassium 12/03/2015 3.1* 3.5 - 5.1 mEq/L Final  . Chloride 12/03/2015 103  98 - 109 mEq/L Final  . CO2 12/03/2015 27  22 - 29 mEq/L Final  . Glucose 12/03/2015 102  70 - 140 mg/dl Final  . BUN 12/03/2015 12.5  7.0 - 26.0 mg/dL Final  . Creatinine 12/03/2015 0.9  0.6 - 1.1 mg/dL Final  . Total Bilirubin 12/03/2015 <0.30  0.20 - 1.20 mg/dL Final  . Alkaline Phosphatase 12/03/2015 70  40 - 150 U/L Final  . AST 12/03/2015 16  5 - 34 U/L Final  . ALT 12/03/2015 22  0 - 55 U/L Final  . Total Protein 12/03/2015 6.8  6.4 - 8.3 g/dL Final  . Albumin 12/03/2015 3.3* 3.5 - 5.0 g/dL Final  . Calcium 12/03/2015 9.3  8.4 - 10.4 mg/dL Final  . Anion Gap 12/03/2015 12* 3 - 11 mEq/L Final  . EGFR 12/03/2015 82* >90 ml/min/1.73 m2 Final  . Magnesium 12/03/2015 1.8  1.5 - 2.5 mg/dl Final    RADIOGRAPHIC STUDIES: Ct Soft Tissue Neck W Contrast  Result Date: 12/03/2015 CLINICAL DATA:  Left neck edema. Feels a lump in throat. History of cervical cancer with metastases. EXAM: CT NECK WITH CONTRAST TECHNIQUE: Multidetector CT imaging of the neck was performed using the standard protocol following the bolus administration of intravenous contrast. CONTRAST:  73m ISOVUE-300 IOPAMIDOL (ISOVUE-300) INJECTION 61% COMPARISON:  06/09/2014 FINDINGS: Pharynx and larynx: Portions of the oral cavity are obscured by streak artifact. No inflammatory or masslike enhancement. No submucosal edema or airway effacement. Salivary glands: Negative Thyroid: Negative Lymph nodes: No enlarged or abnormal density. Vascular: Porta catheter on the right, unremarkable where seen. No other vascular finding Limited intracranial: Negative  Visualized orbits: Negative Mastoids and visualized paranasal sinuses: Clear Skeleton: No acute or aggressive process. C4-5 disc narrowing, mild. Sclerotic foci in the T2 body and spinous process are chronic and benign. Upper chest: Clear apical lungs IMPRESSION: Negative exam.  No explanation for symptoms. Electronically Signed   By: JMonte FantasiaM.D.   On: 12/03/2015 16:30    ASSESSMENT/PLAN:    Sensation of lump in throat Patient states that she has been experiencing the sensation of a lump in the middle of her throat the past 24-48 hours.  She denies any pain to this area.  She states she is managing all of her oral secretions well; but feels the lump every time she swallows.  She has also noted some edema-but more on the left than the right.  She's had no difficulties with her right upper chest Port-A-Cath.  However, patient has been recently diagnosed with a right arm DVT and a superficial thrombus.  She states that the swelling to her arm has greatly improved since she started taking the Lovenox and the Coumadin as directed.  Exam today revealed.  Patient managing all oral secretions with no difficulty.  She has some mild edema to her entire right upper extremity; it does appear to be extending to the right neck region.  Port-A-Cath intact with no evidence of infection.  With closer exam-it does appear the patient has increased fullness to the left side of her neck only.  There is no mass palpated.  Whatsoever.  Reviewed all findings with Dr. LMarko Plume and she was in agreement to proceed with a CT with contrast of the soft tissues of the neck for further evaluation today.  Was able to obtain prior authorization patient's  insurance company as well.  Advised patient would follow up with her regarding the CT neck results.  Note: Patient was very anxious and in a hurry to leave the [place] today; stating that she had an appointment with her 29 year old daughters kidney transplant team at  [facility] in 1 1/2 hours.   Patient was advised to call/return or go directly to the emergency department over the weekend with any new or worsening symptoms whatsoever.  Malignant neoplasm of exocervix [facility]) Patient is status post chemotherapy; and has now completed all of her external radiation treatments.  Her last external radiation treatment was on Tuesday, 12/07/2015.  She states she still has 1 further internal brachytherapy session scheduled for this coming week.  Patient is scheduled to return on 12/06/2015 for labs, flush, and a follow-up visit.  Long term current use of anticoagulant therapy Patient has recently been diagnosed with a right upper extremity DVT and superficial thrombus.  She continues with her Lovenox bridge and takes Coumadin as directed.  INR today was 2.1.  When questioned what her structures were regarding the use of the Lovenox while she is taking the Coumadin-patient stated that she was told to complete all of the Lovenox; and then to continue the Coumadin as directed.  Hypokalemia Patient's potassium was low at 3.1 today.  Will need to call in potassium tablets for the patient.   Patient stated understanding of all instructions; and was in agreement with this plan of care. The patient knows to call the clinic with any problems, questions or concerns.   Total time spent with patient was 40 minutes;  with greater than 75 percent of that time spent in face to face counseling regarding patient's symptoms,  and coordination of care and follow up.  Disclaimer:This dictation was prepared with Dragon/digital dictation along with [employer]. Any transcriptional errors that result from this process are unintentional.  Drue Second, NP 12/06/2015

## 2015-12-06 NOTE — Assessment & Plan Note (Signed)
Patient has recently been diagnosed with a right upper extremity DVT and superficial thrombus.  She continues with her Lovenox bridge and takes Coumadin as directed.  INR today was 2.1.  When questioned what her structures were regarding the use of the Lovenox while she is taking the Coumadin-patient stated that she was told to complete all of the Lovenox; and then to continue the Coumadin as directed.

## 2015-12-06 NOTE — Assessment & Plan Note (Signed)
Patient's potassium was low at 3.1 today.  Will need to call in potassium tablets for the patient.

## 2015-12-06 NOTE — Telephone Encounter (Signed)
Medical Oncology  Patient seen in symptom management clinic on 8-11, with CT neck done due to complaints of left neck swelling and "lump in throat".  INR on 8-11 was 2.1.   Lovenox completed 8-12. Patient is not sure what dose of coumadin she took on 8-12, but took 10 mg on 8-13.  Patient missed lab + MD today. She has HDR on 8-15 starting 0600. She is not able to see MD later today. RN spoke with her by phone, then MD.  She has training daily week of 8-21 at [facility] for home dialysis for daughter, so hopes she will not need any appointments that week.  Results of CT neck discussed, which shows no swelling, no blood clots and no thyroid abnormality on left.  Patient feels that "lump in throat" is smaller. She notices some swelling right neck, which is side of PAC and side of DVT. She has no swelling or discomfort now in RUE, no bleeding.   Told patient to take coumadin 5 mg today. She will come to [facility] on 8-15 after HDR for INR, and will need to be instructed on coumadin dose then.  Will ask IR to remove PAC.  She is having hot flashes and sweats, discussed.  Patient asking about more ativan which she is using for anxiety, has 3 left. I have reminded her that this medication can cause dependence, also that she should not use ativan if she is driving or when she is caring for daughter. I reminded her that she has completed chemo, that the cervical involvement was much better at Dr Clabe Seal exam under anesthesia and HDR nearly completed, reassured her that team at [facility] is excellent.  She feels that she may be ok without more ativan, but can also let us know how she is.   Godfrey Pick, MD

## 2015-12-06 NOTE — Assessment & Plan Note (Signed)
Patient states that she has been experiencing the sensation of a lump in the middle of her throat the past 24-48 hours.  She denies any pain to this area.  She states she is managing all of her oral secretions well; but feels the lump every time she swallows.  She has also noted some edema-but more on the left than the right.  She's had no difficulties with her right upper chest Port-A-Cath.  However, patient has been recently diagnosed with a right arm DVT and a superficial thrombus.  She states that the swelling to her arm has greatly improved since she started taking the Lovenox and the Coumadin as directed.  Exam today revealed.  Patient managing all oral secretions with no difficulty.  She has some mild edema to her entire right upper extremity; it does appear to be extending to the right neck region.  Port-A-Cath intact with no evidence of infection.  With closer exam-it does appear the patient has increased fullness to the left side of her neck only.  There is no mass palpated.  Whatsoever.  Reviewed all findings with Dr. Marko Plume; and she was in agreement to proceed with a CT with contrast of the soft tissues of the neck for further evaluation today.  Was able to obtain prior authorization patient's insurance company as well.  Advised patient would follow up with her regarding the CT neck results.  Note: Patient was very anxious and in a hurry to leave the [place] today; stating that she had an appointment with her 33 year old daughters kidney transplant team at [facility] in 1 1/2 hours.   Patient was advised to call/return or go directly to the emergency department over the weekend with any new or worsening symptoms whatsoever.

## 2015-12-06 NOTE — Anesthesia Preprocedure Evaluation (Addendum)
Anesthesia Evaluation  Patient identified by MRN, date of birth, ID band Patient awake    Reviewed: Allergy & Precautions, NPO status , Patient's Chart, lab work & pertinent test results  Airway Mallampati: II  TM Distance: >3 FB Neck ROM: Full    Dental no notable dental hx. (+) Dental Advisory Given, Teeth Intact, Missing, Caps,    Pulmonary neg pulmonary ROS,    Pulmonary exam normal        Cardiovascular + DVT  Normal cardiovascular exam  12-10-14 Stress echo results:   There was no diagnostic evidence for stress-induced ischemia.   Neuro/Psych  Headaches, Depression negative neurological ROS  negative psych ROS   GI/Hepatic negative GI ROS, Neg liver ROS, GERD  Medicated,  Endo/Other  Hypothyroidism Morbid obesity  Renal/GU negative Renal ROS  negative genitourinary   Musculoskeletal negative musculoskeletal ROS (+)   Abdominal   Peds negative pediatric ROS (+)  Hematology negative hematology ROS (+) anemia ,   Anesthesia Other Findings   Reproductive/Obstetrics negative OB ROS                          Anesthesia Physical  Anesthesia Plan  ASA: III  Anesthesia Plan: General   Post-op Pain Management:    Induction: Intravenous  Airway Management Planned: LMA and Oral ETT  Additional Equipment:   Intra-op Plan:   Post-operative Plan: Extubation in OR  Informed Consent: I have reviewed the patients History and Physical, chart, labs and discussed the procedure including the risks, benefits and alternatives for the proposed anesthesia with the patient or authorized representative who has indicated his/her understanding and acceptance.   Dental advisory given  Plan Discussed with: Anesthesiologist  Anesthesia Plan Comments:        Anesthesia Quick Evaluation

## 2015-12-06 NOTE — Telephone Encounter (Signed)
Called and spoke with patient regarding her labs called and spoke to patient regarding her labs from this past Friday, 12/03/2015.  Her potassium had been down to 3.1.  Have prescribed patient potassium 20 mEq to be taken twice daily 1 day; and then to decrease down to potassium 20 mEq on a daily basis till gone.  Also, patient missed her labs and appointment with Dr. Marko Plume today; but states that she has a lab appointment scheduled for tomorrow, Tuesday, 12/07/2015.  Patient also confirmed that Dr. Mariana Kaufman office has reviewed the CT neck results with her.

## 2015-12-06 NOTE — Assessment & Plan Note (Signed)
Patient is status post chemotherapy; and has now completed all of her external radiation treatments.  Her last external radiation treatment was on Tuesday, 12/07/2015.  She states she still has 1 further internal brachytherapy session scheduled for this coming week.  Patient is scheduled to return on 12/06/2015 for labs, flush, and a follow-up visit.

## 2015-12-07 ENCOUNTER — Ambulatory Visit (HOSPITAL_COMMUNITY)
Admission: RE | Admit: 2015-12-07 | Discharge: 2015-12-07 | Disposition: A | Discharge status: Home / Self Care | Payer: BLUE CROSS/BLUE SHIELD | Source: Ambulatory Visit | Attending: Radiation Oncology | Admitting: Radiation Oncology

## 2015-12-07 ENCOUNTER — Ambulatory Visit (HOSPITAL_BASED_OUTPATIENT_CLINIC_OR_DEPARTMENT_OTHER): Payer: BLUE CROSS/BLUE SHIELD | Admitting: Anesthesiology

## 2015-12-07 ENCOUNTER — Encounter: Payer: Self-pay | Admitting: Oncology

## 2015-12-07 ENCOUNTER — Other Ambulatory Visit: Payer: Self-pay | Admitting: Oncology

## 2015-12-07 ENCOUNTER — Encounter: Payer: Self-pay | Admitting: Radiation Oncology

## 2015-12-07 ENCOUNTER — Ambulatory Visit
Admission: RE | Admit: 2015-12-07 | Discharge: 2015-12-07 | Disposition: A | Discharge status: Home / Self Care | Payer: BLUE CROSS/BLUE SHIELD | Source: Ambulatory Visit | Attending: Radiation Oncology | Admitting: Radiation Oncology

## 2015-12-07 ENCOUNTER — Telehealth: Payer: Self-pay

## 2015-12-07 ENCOUNTER — Ambulatory Visit (HOSPITAL_BASED_OUTPATIENT_CLINIC_OR_DEPARTMENT_OTHER)
Admission: RE | Admit: 2015-12-07 | Discharge: 2015-12-07 | Disposition: A | Discharge status: Home / Self Care | Payer: BLUE CROSS/BLUE SHIELD | Source: Ambulatory Visit | Attending: Radiation Oncology | Admitting: Radiation Oncology

## 2015-12-07 ENCOUNTER — Other Ambulatory Visit: Payer: BLUE CROSS/BLUE SHIELD

## 2015-12-07 ENCOUNTER — Telehealth: Payer: Self-pay | Admitting: Oncology

## 2015-12-07 ENCOUNTER — Encounter (HOSPITAL_BASED_OUTPATIENT_CLINIC_OR_DEPARTMENT_OTHER)
Admission: RE | Disposition: A | Discharge status: Home / Self Care | Payer: Self-pay | Source: Ambulatory Visit | Attending: Radiation Oncology

## 2015-12-07 ENCOUNTER — Encounter (HOSPITAL_BASED_OUTPATIENT_CLINIC_OR_DEPARTMENT_OTHER): Payer: Self-pay | Admitting: *Deleted

## 2015-12-07 VITALS — BP 124/76 | HR 71

## 2015-12-07 DIAGNOSIS — Z6837 Body mass index (BMI) 37.0-37.9, adult: Secondary | ICD-10-CM | POA: Insufficient documentation

## 2015-12-07 DIAGNOSIS — C539 Malignant neoplasm of cervix uteri, unspecified: Secondary | ICD-10-CM

## 2015-12-07 DIAGNOSIS — C531 Malignant neoplasm of exocervix: Secondary | ICD-10-CM

## 2015-12-07 DIAGNOSIS — Z95828 Presence of other vascular implants and grafts: Secondary | ICD-10-CM

## 2015-12-07 DIAGNOSIS — K219 Gastro-esophageal reflux disease without esophagitis: Secondary | ICD-10-CM | POA: Diagnosis not present

## 2015-12-07 DIAGNOSIS — I808 Phlebitis and thrombophlebitis of other sites: Secondary | ICD-10-CM

## 2015-12-07 DIAGNOSIS — I82621 Acute embolism and thrombosis of deep veins of right upper extremity: Secondary | ICD-10-CM

## 2015-12-07 DIAGNOSIS — Z86718 Personal history of other venous thrombosis and embolism: Secondary | ICD-10-CM | POA: Diagnosis not present

## 2015-12-07 HISTORY — PX: TANDEM RING INSERTION: SHX6199

## 2015-12-07 LAB — PROTIME-INR
INR: 1.96
PROTHROMBIN TIME: 22.6 s — AB (ref 11.4–15.2)

## 2015-12-07 SURGERY — INSERTION, UTERINE TANDEM AND RING OR CYLINDER, FOR BRACHYTHERAPY
Anesthesia: General | Site: Vagina

## 2015-12-07 MED ORDER — WATER FOR IRRIGATION, STERILE IR SOLN
Status: DC | PRN
  Administered 2015-12-07: 500 mL

## 2015-12-07 MED ORDER — ARTIFICIAL TEARS OP OINT
TOPICAL_OINTMENT | OPHTHALMIC | Status: AC
  Filled 2015-12-07: qty 7

## 2015-12-07 MED ORDER — FENTANYL CITRATE (PF) 100 MCG/2ML IJ SOLN
INTRAMUSCULAR | Status: DC | PRN
  Administered 2015-12-07 (×2): 50 ug via INTRAVENOUS

## 2015-12-07 MED ORDER — PROMETHAZINE HCL 25 MG/ML IJ SOLN
6.2500 mg | INTRAMUSCULAR | Status: DC | PRN
  Filled 2015-12-07: qty 1

## 2015-12-07 MED ORDER — LACTATED RINGERS IV SOLN
INTRAVENOUS | Status: DC
  Administered 2015-12-07: 11:00:00 via INTRAVENOUS
  Filled 2015-12-07 (×2): qty 250

## 2015-12-07 MED ORDER — KETOROLAC TROMETHAMINE 30 MG/ML IJ SOLN
INTRAMUSCULAR | Status: AC
  Filled 2015-12-07: qty 1

## 2015-12-07 MED ORDER — ONDANSETRON HCL 4 MG/2ML IJ SOLN
INTRAMUSCULAR | Status: DC | PRN
  Administered 2015-12-07: 4 mg via INTRAVENOUS

## 2015-12-07 MED ORDER — HYDROMORPHONE HCL 1 MG/ML IJ SOLN
INTRAMUSCULAR | Status: AC
  Filled 2015-12-07: qty 1

## 2015-12-07 MED ORDER — LIDOCAINE HCL (CARDIAC) 20 MG/ML IV SOLN
INTRAVENOUS | Status: DC | PRN
  Administered 2015-12-07: 60 mg via INTRAVENOUS

## 2015-12-07 MED ORDER — LACTATED RINGERS IV SOLN
INTRAVENOUS | Status: DC
Start: 2015-12-07 — End: 2015-12-09
  Administered 2015-12-07 (×2): via INTRAVENOUS
  Filled 2015-12-07 (×2): qty 1000

## 2015-12-07 MED ORDER — DEXAMETHASONE SODIUM PHOSPHATE 10 MG/ML IJ SOLN
INTRAMUSCULAR | Status: AC
  Filled 2015-12-07: qty 1

## 2015-12-07 MED ORDER — ESTRADIOL 0.1 MG/GM VA CREA
TOPICAL_CREAM | VAGINAL | Status: DC | PRN
  Administered 2015-12-07: 1 via VAGINAL

## 2015-12-07 MED ORDER — ONDANSETRON HCL 4 MG/2ML IJ SOLN
INTRAMUSCULAR | Status: AC
  Filled 2015-12-07: qty 2

## 2015-12-07 MED ORDER — HYDROMORPHONE HCL 1 MG/ML IJ SOLN
0.2500 mg | INTRAMUSCULAR | Status: DC | PRN
  Administered 2015-12-07: 0.5 mg via INTRAVENOUS
  Filled 2015-12-07: qty 1

## 2015-12-07 MED ORDER — HYDROMORPHONE HCL 1 MG/ML IJ SOLN
0.5000 mg | Freq: Once | INTRAMUSCULAR | Status: AC
  Administered 2015-12-07: 0.5 mg via INTRAVENOUS
  Filled 2015-12-07: qty 1

## 2015-12-07 MED ORDER — MIDAZOLAM HCL 5 MG/5ML IJ SOLN
INTRAMUSCULAR | Status: DC | PRN
  Administered 2015-12-07: 2 mg via INTRAVENOUS

## 2015-12-07 MED ORDER — FENTANYL CITRATE (PF) 100 MCG/2ML IJ SOLN
INTRAMUSCULAR | Status: AC
  Filled 2015-12-07: qty 2

## 2015-12-07 MED ORDER — ENOXAPARIN SODIUM 150 MG/ML ~~LOC~~ SOLN: 150.0000 mg | SUBCUTANEOUS | 0 refills | Status: DC

## 2015-12-07 MED ORDER — PROPOFOL 10 MG/ML IV BOLUS
INTRAVENOUS | Status: AC
  Filled 2015-12-07: qty 40

## 2015-12-07 MED ORDER — LIDOCAINE HCL (CARDIAC) 20 MG/ML IV SOLN
INTRAVENOUS | Status: AC
  Filled 2015-12-07: qty 5

## 2015-12-07 MED ORDER — SCOPOLAMINE 1 MG/3DAYS TD PT72SCOPOLAMINE 1 MG/3DAYS
1.0000 | MEDICATED_PATCH | TRANSDERMAL | Status: DC
Start: 2015-12-07 — End: 2015-12-09
  Administered 2015-12-07: 1.5 mg via TRANSDERMAL
  Filled 2015-12-07: qty 1

## 2015-12-07 MED ORDER — HYDROMORPHONE HCL 1 MG/ML IJ SOLN
1.0000 mg | INTRAMUSCULAR | Status: AC
  Administered 2015-12-07: 1 mg via INTRAVENOUS
  Filled 2015-12-07: qty 1

## 2015-12-07 MED ORDER — DEXAMETHASONE SODIUM PHOSPHATE 10 MG/ML IJ SOLN
INTRAMUSCULAR | Status: DC | PRN
  Administered 2015-12-07: 10 mg via INTRAVENOUS

## 2015-12-07 MED ORDER — PROPOFOL 500 MG/50ML IV EMUL
INTRAVENOUS | Status: DC | PRN
  Administered 2015-12-07: 200 mL via INTRAVENOUS
  Administered 2015-12-07: 50 mL via INTRAVENOUS

## 2015-12-07 MED ORDER — SCOPOLAMINE 1 MG/3DAYS TD PT72
MEDICATED_PATCH | TRANSDERMAL | Status: AC
  Filled 2015-12-07: qty 1

## 2015-12-07 MED ORDER — MIDAZOLAM HCL 2 MG/2ML IJ SOLN
INTRAMUSCULAR | Status: AC
  Filled 2015-12-07: qty 2

## 2015-12-07 MED FILL — ENOXAPARIN 150 MG/ML SYR: 150 | 18 days supply | Qty: 18 | Fill #0

## 2015-12-07 MED FILL — WARFARIN SODIUM 5 MG TABLET: 5 | 15 days supply | Qty: 30 | Fill #1

## 2015-12-07 SURGICAL SUPPLY — 29 items
BAG URINE DRAINAGE (UROLOGICAL SUPPLIES) ×3 IMPLANT
BNDG CONFORM 2 STRL LF (GAUZE/BANDAGES/DRESSINGS) IMPLANT
CATH FOLEY 2WAY SLVR  5CC 20FR (CATHETERS) ×2
CATH FOLEY 2WAY SLVR 5CC 20FR (CATHETERS) ×1 IMPLANT
COVER BACK TABLE 60X90IN (DRAPES) ×3 IMPLANT
DRAPE LG THREE QUARTER DISP (DRAPES) ×3 IMPLANT
DRAPE UNDERBUTTOCKS STRL (DRAPE) ×3 IMPLANT
GLOVE BIO SURGEON STRL SZ7.5 (GLOVE) ×6 IMPLANT
GLOVE INDICATOR 7.5 STRL GRN (GLOVE) ×6 IMPLANT
GOWN STRL REUS W/ TWL LRG LVL3 (GOWN DISPOSABLE) ×1 IMPLANT
GOWN STRL REUS W/TWL LRG LVL3 (GOWN DISPOSABLE) ×5 IMPLANT
HOLDER FOLEY CATH W/STRAP (MISCELLANEOUS) ×3 IMPLANT
KIT ROOM TURNOVER WOR (KITS) ×3 IMPLANT
LEGGING LITHOTOMY PAIR STRL (DRAPES) ×3 IMPLANT
NEEDLE SPNL 22GX3.5 QUINCKE BK (NEEDLE) IMPLANT
PACK BASIN DAY SURGERY FS (CUSTOM PROCEDURE TRAY) ×3 IMPLANT
PAD ABD 8X10 STRL (GAUZE/BANDAGES/DRESSINGS) ×3 IMPLANT
PAD OB MATERNITY 4.3X12.25 (PERSONAL CARE ITEMS) IMPLANT
PAD PREP 24X48 CUFFED NSTRL (MISCELLANEOUS) ×3 IMPLANT
SET IRRIG Y TYPE TUR BLADDER L (SET/KITS/TRAYS/PACK) ×3 IMPLANT
SUT PROLENE 0 SH 30 (SUTURE) IMPLANT
SUT SILK 2 0 30  PSL (SUTURE)
SUT SILK 2 0 30 PSL (SUTURE) IMPLANT
SYR BULB IRRIGATION 50ML (SYRINGE) IMPLANT
SYR CONTROL 10ML LL (SYRINGE) IMPLANT
SYRINGE 10CC LL (SYRINGE) ×3 IMPLANT
TOWEL OR 17X24 6PK STRL BLUE (TOWEL DISPOSABLE) ×6 IMPLANT
TRAY DSU PREP LF (CUSTOM PROCEDURE TRAY) ×3 IMPLANT
WATER STERILE IRR 500ML POUR (IV SOLUTION) ×3 IMPLANT

## 2015-12-07 NOTE — Progress Notes (Signed)
IMMEDIATELY FOLLOWING SURGERY: Do not drive or operate machinery for the first twenty four hours after surgery. Do not make any important decisions for twenty four hours after surgery or while taking narcotic pain medications or sedatives. If you develop intractable nausea and vomiting or a severe headache please notify your doctor immediately.   FOLLOW-UP: You do not need to follow up with anesthesia unless specifically instructed to do so.   WOUND CARE INSTRUCTIONS (if applicable): Expect some mild vaginal bleeding, but if large amount of bleeding occurs please contact Dr. Sondra Come at [phone] or the Radiation On-Call physician. Call for any fever greater than 101.0 degrees or increasing vaginal//abdominal pain or trouble urinating.   QUESTIONS?: Please feel free to call your physician or the hospital operator if you have any questions, and they will be happy to assist you. Resume all medications: as listed on your after visit summary. Your next appointment is:  Future Appointments Date Time Provider [place]  12/07/2015 2:00 PM [facility] LAB 2 [facility] None  12/20/2015 1:30 PM [facility] 1 [facility] [place]  01/13/2016 10:20 AM Gery Pray, MD [facility] None

## 2015-12-07 NOTE — Progress Notes (Signed)
  Radiation Oncology         [phone] ________________________________  Name: Lindsey Hughes MRN: [id number]  Date: 12/07/2015  DOB: 02/15/1971  CC: Antonieta Iba, MD  HDR BRACHYTHERAPY NOTE  DIAGNOSIS:    ICD-9-CM  ICD-10-CM    1.  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 Identity was confirmed. All relevant records and images related to the planned course of therapy were reviewed. The patient freely provided informed written consent to proceed with treatment after reviewing the details related to the planned course of therapy. The consent form was witnessed and verified by the simulation staff. Then, the patient was set-up in a stable reproducible supine position for radiation therapy. The tandem ring system was accessed and fiducial markers were placed within the tandem and ring.   Simple treatment device note: On the operating room the patient had construction of her custom tandem ring system. She will be treated with a 60 tandem/ring system. The patient had placement of a 60 mm tandem. A cervical ring with a small shielding cap was used for her treatment. A rectal paddle was also part of her custom set up device.  Verification simulation note: An AP and lateral film was obtained through the pelvis area. This was compared to the patient's planning films documenting accurate position of the tandem/ring system for treatment.  High-dose-rate brachytherapy treatment note:  The remote afterloading device was accessed through catheter system and attached to the tandem ring system. Patient then proceeded to undergo her fifth high-dose-rate treatment directed at the proximal vagina. The patient was prescribed a dose of 5.5 gray to be delivered to the mucosal surface. Patient was treated with 2 channels using 23 dwell positions. Treatment time was 463.6 seconds. The patient tolerated the procedure  well. After completion of her therapy, a radiation survey was performed documenting return of the iridium source into the [employer] safe. The patient was then transferred to the nursing suite. She then had removal of the rectal paddle followed by the tandem and ring system. The patient's cervical sleeve also was removed .The patient tolerated the removal well.  PLAN: The patient completed all of her therapy and will return to radiation oncology for a follow up in 1 month.  ________________________________   Blair Promise, PhD, MD  This document serves as a record of services personally performed by Gery Pray, MD. It was created on his behalf by Darcus Austin, a trained medical scribe. The creation of this record is based on the scribe's personal observations and the provider's statements to them. This document has been checked and approved by the attending provider.

## 2015-12-07 NOTE — Progress Notes (Signed)
  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  Identity was confirmed.  All relevant records and images related to the planned course of therapy were reviewed.  The patient freely provided informed written consent to proceed with treatment after reviewing the details related to the planned course of therapy. The consent form was witnessed and verified by the simulation staff.  Then, the patient was set-up in a stable reproducible  supine position for radiation therapy.  CT images were obtained.  Surface markings were placed.  The CT images were loaded into the planning software.  Then the target and avoidance structures were contoured.  Treatment planning then occurred.  The radiation prescription was entered and confirmed.   I have requested : Brachytherapy Isodose Plan and Dosimetry Calculations to plan the radiation distribution.    PLAN:  The patient will receive 5.5 Gy in 1 fraction. Iridium 192 will be the high-dose-rate source.  ________________________________  Blair Promise, PhD, MD  This document serves as a record of services personally performed by Gery Pray, MD. It was created on his behalf by Darcus Austin, a trained medical scribe. The creation of this record is based on the scribe's personal observations and the provider's statements to them. This document has been checked and approved by the attending provider.

## 2015-12-07 NOTE — Anesthesia Procedure Notes (Signed)
Procedure Name: LMA Insertion Date/Time: 12/07/2015 7:40 AM Performed by: Wanita Chamberlain Pre-anesthesia Checklist: Patient identified, Timeout performed, Emergency Drugs available, Suction available and Patient being monitored Patient Re-evaluated:Patient Re-evaluated prior to inductionOxygen Delivery Method: Circle system utilized Preoxygenation: Pre-oxygenation with 100% oxygen Intubation Type: IV induction Ventilation: Mask ventilation without difficulty LMA: LMA inserted LMA Size: 4.0 Number of attempts: 1 Placement Confirmation: positive ETCO2 and breath sounds checked- equal and bilateral Tube secured with: Tape Dental Injury: Teeth and Oropharynx as per pre-operative assessment

## 2015-12-07 NOTE — H&P (View-Only) (Signed)
Radiation Oncology         [phone] ________________________________  History and physical examination  Name: Kendricka Feasel MRN: [id number]  Date: 10/06/2015  DOB: 1970/07/26  [id number], Anderson Malta L, PA-C  No ref. 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   HISTORY OF PRESENT ILLNESS::Lindsey Hughes is a 45 y.o. 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 Patient is currently undergoing external beam radiation therapy along with radiosensitizing chemotherapy. Her pelvic and low back pain has improved with her treatments as well as her vaginal bleeding. Patient is now taken to the operating room for her first brachytherapy procedure using iridium 192 with the tandem ring system Dr. Denman George will be present for exam under anesthesia and  placement of cervical sleeve for multiple tandem ring insertions.   PAST MEDICAL HISTORY:  has a past medical history of Hypothyroidism; Anemia; Depression; Headache; Radiation; GERD (gastroesophageal reflux disease); Chemotherapy-induced nausea; Chemotherapy induced diarrhea; Port-a-cath in place; History of thrombophlebitis (RESOLVED-- but per pt prefer no bp or  puncture done in right arm); and Cervical cancer [facility]) (oncologist-  dr livesay/  dr Katherina Wimer/  dr Denman George).    PAST SURGICAL HISTORY: Past Surgical History  Procedure Laterality Date  . Cesarean section  1991   . Carpal tunnel release Bilateral 2002 and 2009  . Lasik Left 2015  . Dilation and curettage of uterus N/A 09/03/2015    Procedure: EXAM UNDER ANESTHESIA  WITH FROZEN SECTION CERVICAL BIOPSY & ENDOCERVICAL CURRETTINGS;  Surgeon: Aloha Gell, MD;  Location: [facility];  Service: Gynecology;  Laterality: N/A;  . Tubal ligation  1996  . Portacath placement  10-11-2015  . Echo stress test  12-10-2014    normal w/ no evidence for  stress-induced ischemia/  normal LV function and wall motion , ef 65-70%    FAMILY HISTORY: family history includes Brain cancer in her father; CAD in her brother and mother; Lung cancer in her father and mother.  SOCIAL HISTORY:  reports that she has never smoked. She has never used smokeless tobacco. She reports that she does not drink alcohol or use illicit drugs.  ALLERGIES: Zofran  MEDICATIONS:  No current facility-administered medications for this encounter.   Current Outpatient Prescriptions  Medication Sig Dispense Refill  . acetaminophen (TYLENOL) 500 MG tablet Take 500 mg by mouth every 6 (six) hours as needed. Reported on 11/02/2015    . ALPRAZolam (XANAX) 0.25 MG tablet Take 0.25 mg by mouth as needed for anxiety. Reported on 10/21/2015    . ibuprofen (ADVIL,MOTRIN) 800 MG tablet Take 800 mg by mouth every 8 (eight) hours as needed for mild pain or moderate pain. Reported on 11/02/2015  4  . lidocaine-prilocaine (EMLA) cream Apply  1-2 hrs prior to Portacath access prn. 30 g 1  . loperamide (IMODIUM) 1 MG/5ML solution Take by mouth as needed for diarrhea or loose stools. Reported on 10/29/2015    . LORazepam (ATIVAN) 1 MG tablet Place 1/2 -1 tabled under the tongue or swallow every 6 hrs as needed for nausea.  Will make drowsy. 20 tablet 0  . magic mouthwash SOLN Take 5-10 mLs by mouth every 4 (four) hours as needed for mouth pain. 1:1 ratio 2% viscous lidocaine;benadryl, and nystatin 480 mL 1  . oxyCODONE-acetaminophen (ROXICET) 5-325 MG tablet Take 1 tablet by mouth every 4 (four) hours as needed for severe pain. Oxycodone/percocet/roxicet,  30 tablets, no refills 12 tablet 0  . pantoprazole (PROTONIX) 40 MG tablet Take 1 tablet (40 mg total) by mouth daily. (Patient taking differently: Take 40 mg by mouth every morning. ) 30 tablet 2  . prochlorperazine (COMPAZINE) 10 MG tablet Take 1 tablet (10 mg total) by mouth every 6 (six) hours as needed for nausea or vomiting. 30 tablet 1  .  triamcinolone (KENALOG) 0.1 % paste Apply to mouth ulers twice a day. (Patient taking differently: as needed. Apply to mouth ulers twice a day.) 5 g 1  . levothyroxine (SYNTHROID, LEVOTHROID) 50 MCG tablet Take 50 mcg by mouth daily before breakfast. Reported on 11/02/2015--  Pt per last dose 09-24-2015  approx.     Facility-Administered Medications Ordered in Other Encounters  Medication Dose Route Frequency Provider Last Rate Last Dose  . famotidine (PEPCID) IVPB 20 mg premix  20 mg Intravenous Q12H Lennis P Livesay, MD   20 mg at 10/07/15 1328    REVIEW OF SYSTEMS:  A 15 point review of systems is documented in the electronic medical record. This was obtained by the nursing staff. However, I reviewed this with the patient to discuss relevant findings and make appropriate changes. Patient has had some nausea with her Radiation and chemotherapy. She has also had some diarrhea with her treatment. Dysuria is also one of her side effects    PHYSICAL EXAM:  height is 5\' 2"  (1.575 m) and weight is 207 lb 6.4 oz (94.076 kg). Her oral temperature is 98.2 F (36.8 C). Her blood pressure is 126/84 and her pulse is 81. Her respiration is 16 and oxygen saturation is 100%.  General: Alert and oriented, in no acute distress HEENT: Head is normocephalic. Extraocular movements are intact. Oropharynx is clear. Teeth in good repair Neck: Neck is supple, no palpable cervical or supraclavicular lymphadenopathy. Heart: Regular in rate and rhythm with no murmurs, rubs, or gallops. Chest: Clear to auscultation bilaterally, with no rhonchi, wheezes, or rales. Abdomen: Soft, nontender, nondistended, with no rigidity or guarding. Extremities: No cyanosis or edema. Lymphatics: see Neck Exam Skin: No concerning lesions. Musculoskeletal: symmetric strength and muscle tone throughout. Neurologic: Cranial nerves II through XII are grossly intact. No obvious focalities. Speech is fluent. Coordination is  intact. Psychiatric: Judgment and insight are intact. Affect is appropriate. Pelvic exam deferred until operating room procedure   LABORATORY DATA:  Lab Results  Component Value Date   WBC 2.0* 11/08/2015   HGB 10.1* 11/08/2015   HCT 29.5* 11/08/2015   MCV 84.5 11/08/2015   PLT 100* 11/08/2015   NEUTROABS 1.4* 11/08/2015   Lab Results  Component Value Date   NA 139 11/08/2015   K 3.6 11/08/2015   CL 104 10/11/2015   CO2 25 11/08/2015   GLUCOSE 113 11/08/2015   CREATININE 0.8 11/08/2015   CALCIUM 9.0 11/08/2015      RADIOGRAPHY: Ir Fluoro Guide Cv Line Right  10/11/2015  INDICATION: 45 year old with cervical cancer. Port-A-Cath needed for chemotherapy. EXAM: FLUOROSCOPIC AND ULTRASOUND GUIDED PLACEMENT OF A SUBCUTANEOUS PORT COMPARISON:  None. MEDICATIONS: Ancef 2 g; The antibiotic was administered within an appropriate time interval prior to skin puncture. ANESTHESIA/SEDATION: Versed 3.0 mg IV; Fentanyl 150 mcg IV; Moderate Sedation Time:  35 minutes The patient was continuously monitored during the procedure by the interventional radiology nurse under my direct supervision. FLUOROSCOPY TIME:  24 seconds (4 mGy) COMPLICATIONS: None immediate. PROCEDURE: The procedure, risks, benefits, and alternatives were explained to the patient. Questions regarding the procedure  were encouraged and answered. The patient understands and consents to the procedure. Patient was placed supine on the interventional table. Ultrasound confirmed a patent right internal jugular vein. The right chest and neck were cleaned with a skin antiseptic and a sterile drape was placed. Maximal barrier sterile technique was utilized including caps, mask, sterile gowns, sterile gloves, sterile drape, hand hygiene and skin antiseptic. The right neck was anesthetized with 1% lidocaine. Small incision was made in the right neck with a blade. Micropuncture set was placed in the right internal jugular vein with ultrasound  guidance. The micropuncture wire was used for measurement purposes. The right chest was anesthetized with 1% lidocaine with epinephrine. #15 blade was used to make an incision and a subcutaneous port pocket was formed. [place] was assembled. Subcutaneous tunnel was formed with a stiff tunneling device. The port catheter was brought through the subcutaneous tunnel. The port was placed in the subcutaneous pocket. The micropuncture set was exchanged for a peel-away sheath. The catheter was placed through the peel-away sheath and the tip was positioned at the superior cavoatrial junction. Catheter placement was confirmed with fluoroscopy. The port was accessed and flushed with heparinized saline. The port pocket was closed using two layers of absorbable sutures and Dermabond. The vein skin site was closed using a single layer of absorbable suture and Dermabond. Sterile dressings were applied. Patient tolerated the procedure well without an immediate complication. Ultrasound and fluoroscopic images were taken and saved for this procedure. IMPRESSION: Placement of a subcutaneous port device. Catheter tip at the superior cavoatrial junction. Electronically Signed   By: Markus Daft M.D.   On: 10/11/2015 14:52   Ir US Guide Vasc Access Right  10/11/2015  INDICATION: 45 year old with cervical cancer. Port-A-Cath needed for chemotherapy. EXAM: FLUOROSCOPIC AND ULTRASOUND GUIDED PLACEMENT OF A SUBCUTANEOUS PORT COMPARISON:  None. MEDICATIONS: Ancef 2 g; The antibiotic was administered within an appropriate time interval prior to skin puncture. ANESTHESIA/SEDATION: Versed 3.0 mg IV; Fentanyl 150 mcg IV; Moderate Sedation Time:  35 minutes The patient was continuously monitored during the procedure by the interventional radiology nurse under my direct supervision. FLUOROSCOPY TIME:  24 seconds (4 mGy) COMPLICATIONS: None immediate. PROCEDURE: The procedure, risks, benefits, and alternatives were explained to the  patient. Questions regarding the procedure were encouraged and answered. The patient understands and consents to the procedure. Patient was placed supine on the interventional table. Ultrasound confirmed a patent right internal jugular vein. The right chest and neck were cleaned with a skin antiseptic and a sterile drape was placed. Maximal barrier sterile technique was utilized including caps, mask, sterile gowns, sterile gloves, sterile drape, hand hygiene and skin antiseptic. The right neck was anesthetized with 1% lidocaine. Small incision was made in the right neck with a blade. Micropuncture set was placed in the right internal jugular vein with ultrasound guidance. The micropuncture wire was used for measurement purposes. The right chest was anesthetized with 1% lidocaine with epinephrine. #15 blade was used to make an incision and a subcutaneous port pocket was formed. [place] was assembled. Subcutaneous tunnel was formed with a stiff tunneling device. The port catheter was brought through the subcutaneous tunnel. The port was placed in the subcutaneous pocket. The micropuncture set was exchanged for a peel-away sheath. The catheter was placed through the peel-away sheath and the tip was positioned at the superior cavoatrial junction. Catheter placement was confirmed with fluoroscopy. The port was accessed and flushed with heparinized saline. The port  pocket was closed using two layers of absorbable sutures and Dermabond. The vein skin site was closed using a single layer of absorbable suture and Dermabond. Sterile dressings were applied. Patient tolerated the procedure well without an immediate complication. Ultrasound and fluoroscopic images were taken and saved for this procedure. IMPRESSION: Placement of a subcutaneous port device. Catheter tip at the superior cavoatrial junction. Electronically Signed   By: Markus Daft M.D.   On: 10/11/2015 14:52      IMPRESSION: Stage II-B squamous cell  carcinoma cervix. The patient is now ready to proceed with brachytherapy as part of her overall management.  PLAN: Patient will proceed to the operating room for scheduled case on July 18 at 7:15 AM.    ------------------------------------------------  Blair Promise, PhD, MD

## 2015-12-07 NOTE — Brief Op Note (Signed)
12/07/2015  9:10 AM  PATIENT:  Lindsey Hughes  45 y.o. female  PRE-OPERATIVE DIAGNOSIS:  EXOCERVIX  POST-OPERATIVE DIAGNOSIS:  EXOCERVIX  PROCEDURE:  Procedure(s): TANDEM RING INSERTION (N/A)  SURGEON:  Surgeon(s) and Role:    * Gery Pray, MD - Primary  PHYSICIAN ASSISTANT:   ASSISTANTS: none   ANESTHESIA:   general  EBL:  Total I/O In: 650 [I.V.:650] Out: 0   BLOOD ADMINISTERED:none  DRAINS: Urinary Catheter (Foley)   LOCAL MEDICATIONS USED:  NONE  SPECIMEN:  No Specimen  DISPOSITION OF SPECIMEN:  N/A  COUNTS:  YES  TOURNIQUET:  * No tourniquets in log *  DICTATION:The patient was taken to outpatient OR #1. Timeout was performed before the procedure was initiated. The perineum and vaginal cavity was prepped by OR staff. A 20 French Foley catheter was  placed during the procedure. .On exam under anesthesia the patient was noted to have excellent response to her external beam and radiosensitizing chemotherapy. The cervical mass was actually noted to be along the anterior lip of the cervix and had decreased in size from approximately 6 cm down to 2 cm on clinical exam. There is no further parametrial extension noted. .The uterus was noted to be in an anteverted position.The cervical sleeve was noted to be sewn in good position along the cervical os. The sutures anchoring the cervical sleeve to the cervix were cut for easy removal cervical sleeve after the patient's treatment. Patient then had placement of a 60 mm, 60tandem within the cervical and endometrial cavity within the cervical sleeve. A 60 tandem was chosen this time to potentially lower dose to the bladder. Patient then had placement of a 60ring with smallshielding capIn place. Patient then had placement of a rectal paddle to shield the anterior rectum from high-dose radiation. Estrace cream was placed within the vaginal vault and perineum to aid in implant removal and for comfort issues. Patient was  subsequently transported to the recovery room in stable condition. Later in the day the patient will undergo planning and her fourthhigh-dose-rate treatment with iridium 192 as the high-dose-rate source. The patient will receive approximately 5.5 Gy to the clinical target volume.  PLAN OF CARE: Transferred to radiation oncology for planning and treatment  PATIENT DISPOSITION:  PACU - hemodynamically stable.   Delay start of Pharmacological VTE agent (>24hrs) due to surgical blood loss or risk of bleeding: not applicable

## 2015-12-07 NOTE — Interval H&P Note (Signed)
History and Physical Interval Note:  12/07/2015 7:41 AM  Lindsey Hughes  has presented today for surgery, with the diagnosis of EXOCERVIX  The various methods of treatment have been discussed with the patient and family. After consideration of risks, benefits and other options for treatment, the patient has consented to  Procedure(s): TANDEM RING INSERTION (N/A) as a surgical intervention .  The patient's history has been reviewed, patient examined, no change in status, stable for surgery.  I have reviewed the patient's chart and labs.  Questions were answered to the patient's satisfaction.     Gery Pray

## 2015-12-07 NOTE — Anesthesia Postprocedure Evaluation (Signed)
Anesthesia Post Note  Patient: Lindsey Hughes  Procedure(s) Performed: Procedure(s) (LRB): TANDEM RING INSERTION (N/A)  Patient location during evaluation: PACU Anesthesia Type: General Level of consciousness: sedated Pain management: pain level controlled Vital Signs Assessment: post-procedure vital signs reviewed and stable Respiratory status: spontaneous breathing and respiratory function stable Cardiovascular status: stable Anesthetic complications: no    Last Vitals:  Vitals:   12/07/15 0907 12/07/15 0912  BP:  117/69  Pulse: 76 76  Resp: 14 11  Temp:      Last Pain:  Vitals:   12/07/15 0918  TempSrc:   PainSc: 3                  Raylie Maddison DANIEL

## 2015-12-07 NOTE — Telephone Encounter (Signed)
lvm to inform pt of 8/31 appt at 330 pm per [facility] LOS

## 2015-12-07 NOTE — Progress Notes (Signed)
Removed foley catheter intact.

## 2015-12-07 NOTE — Progress Notes (Signed)
Patient given discharge paperwork and instructions.  She left the clinic with her husband.

## 2015-12-07 NOTE — Progress Notes (Signed)
Medical Oncology  Spoke with IR physician: needs to be off coumadin/ bridged with lovenox for PAC removal. Per patient, cannot have appointments week of 8-21 due to daughter's appointments at [facility] with [facility] scheduling: can remove PAC on 8-28. Needs to hold lovenox x 24 hrs prior.   INR done at hospital today with HDR  1.96 Message to RN re coumadin and lovenox, to discuss plan with patient.  Scheduling message sent for lab + MD on 12-23-15.  L.Faolan [place]

## 2015-12-07 NOTE — Telephone Encounter (Signed)
-----   Message from Gordy Levan, MD sent at 12/07/2015  1:42 PM EDT ----- INR done at hospital this AM low at 1.96  PAC to be taken out by [facility] IR on 8-28. Tiffany to call patient with that appointment.  If she does not start lovenox today (see below), she needs to take coumadin 10 mg today.  Since INR low today and since she will need to be off coumadin/ back on lovenox to get PAC removed, easiest thing may be to resume lovenox now at same once daily dose as previously, holding coumadin from now until after PAC removed.  She needs to hold lovenox day prior to [facility] removal (8-27) If we resume lovenox now, she will need enough to go thru Sept 1, skipping 8-27   Other option,  so not so many lovenox injections, would be coumadin 10 mg today and daily until check INR Mon or Tues next week (would have to coordinate lab at [facility] with her appointments at [facility] all of next week). Would then stop coumadin and use lovenox Fri 8-25 and Sat 8-26, hold Sun 8-27, resume lovenox on 8-28 after PAC removed that day and use thru Sept 1.   So this way she needs lovenox from 8-25 thru 9-1 skipping 8-27  Either way She would resume coumadin 10 mg daily on day after PAC (8-29.)   Will need INR 8-31; I could see her that day at 4:00.   I have sent scheduling message for 8-31 lab + MD now.   Fine to call me if this does not make sense!  Thanks

## 2015-12-07 NOTE — Transfer of Care (Signed)
Immediate Anesthesia Transfer of Care Note  Patient: Lindsey Hughes  Procedure(s) Performed: Procedure(s): TANDEM RING INSERTION (N/A)  Patient Location: PACU  Anesthesia Type:General  Level of Consciousness: awake, alert , oriented and patient cooperative  Airway & Oxygen Therapy: Patient Spontanous Breathing and Patient connected to nasal cannula oxygen  Post-op Assessment: Report given to RN and Post -op Vital signs reviewed and stable  Post vital signs: Reviewed and stable  Last Vitals:  Vitals:   12/07/15 0600  BP: 111/71  Pulse: 79  Resp: 16  Temp: 36.7 C    Last Pain:  Vitals:   12/07/15 0600  TempSrc: Oral      Patients Stated Pain Goal: 7 ([id number])  Complications: No apparent anesthesia complications

## 2015-12-07 NOTE — Telephone Encounter (Signed)
PAC to be removed 12-20-15 in [facility] Ms Hollick choose to resume Lovenox injections as noted below by Dr. Marko Plume.  Lovenox 150 mg sq daily beginning tonight. Hold Lovenox Sunday 12-19-15 for PAC removal 12-20-15. Resume Lovenox on 12-20-15 after PAC removal.  Resume  Coumadin 10 mg on Tuesday 8--29-17;12-22-15. In addition to Lovenox.   Lab and visit with Dr. Marko Plume for 12-23-15.  Scheduling to call her with appointment times. Further instructions on medication dosing at this visit. Ms Ostenson wrote instructions down and repeated instructions back to this nurse correctly. Lovenox 150 mg syrings sent to [place] # 18.  Pt qualified for Lovenox assistance and will receive  $4000 for oral and IV medications.  This will be good until 12-06-2016 per Annamary Rummage in [place] at [facility].

## 2015-12-08 ENCOUNTER — Encounter: Payer: Self-pay | Admitting: Oncology

## 2015-12-08 ENCOUNTER — Encounter (HOSPITAL_BASED_OUTPATIENT_CLINIC_OR_DEPARTMENT_OTHER): Payer: Self-pay | Admitting: Radiation Oncology

## 2015-12-08 NOTE — Progress Notes (Signed)
Lindsey Hughes (nurse) inquired about copay assistance for Lovenox for pt.  She stated pt's copay for this drug was expensive and wanted to know if pt could use her Lindsey Hughes or Lindsey Hughes grant to pay for it.  I advised if it was that expensive it would use up all of her grant funds so I researched and found the Lindsey Hughes had funds available for her Dx.  I applied in her behalf and she is approved for $4000 to cover drugs associated w/ her Dx for 12 months from 12/07/15 w/ a 6 month look back period. I emailed [facility] (pharmacy) a copy of her award letter.  Lindsey Hughes informed me that pt's copay for this drug is $0 with her ins but will keep the grant open in case anything changes with her ins.  Gave a copy of approval letter to HIM to scan in pt's chart and to [place] to file in her book.

## 2015-12-09 ENCOUNTER — Encounter: Payer: Self-pay | Admitting: Oncology

## 2015-12-09 NOTE — Addendum Note (Signed)
Encounter addended by: Jenene Slicker, RN on: 12/09/2015  4:30 PM<BR>    Actions taken: Charge Capture section accepted

## 2015-12-09 NOTE — Progress Notes (Signed)
Received signed physician form for PAF. ° °Faxed to PAF. Fax received ok per confirmation sheet. °

## 2015-12-16 NOTE — Progress Notes (Addendum)
  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     Indication for treatment: Curative along with chemotherapy     Radiation treatment dates:   External Beam Radiation to the Pelvis: 10/04/15 - 11/19/15  HDR-Cervical: 11/09/15 - 12/07/15  Site/dose:   1) Pelvis: 45 Gy in 25 fractions.  2) Pelvic Boost: 9 Gy in 5 fractions.  3) Brachytherapy: 27.5 Gy in 5 fractions. Tandem/ring system.  Beams/energy:   1) 3D // 15X Photon  2) Isodose Plan // 15X Photon  3) HDR-Cervix // Iridium-192 was the high-dose-rate source  Narrative: The patient tolerated radiation treatment relatively well. Excellent response to the treatment with significant shrinkage of the cervical mass. She complained of bladder irritation and was found to have a bladder infection at one point during treatment.  Plan: The patient has completed radiation treatment. The patient will return to radiation oncology clinic for routine followup in one month. I advised them to call or return sooner if they have any questions or concerns related to their recovery or treatment.  -----------------------------------  Blair Promise, PhD, MD  This document serves as a record of services personally performed by Gery Pray, MD. It was created on his behalf by Darcus Austin, a trained medical scribe. The creation of this record is based on the scribe's personal observations and the provider's statements to them. This document has been checked and approved by the attending provider.

## 2015-12-17 ENCOUNTER — Other Ambulatory Visit: Payer: Self-pay | Admitting: Radiology

## 2015-12-19 ENCOUNTER — Other Ambulatory Visit: Payer: Self-pay | Admitting: Oncology

## 2015-12-20 ENCOUNTER — Other Ambulatory Visit: Payer: Self-pay | Admitting: Radiology

## 2015-12-20 ENCOUNTER — Encounter: Payer: Self-pay | Admitting: Radiation Oncology

## 2015-12-20 ENCOUNTER — Telehealth: Payer: Self-pay | Admitting: *Deleted

## 2015-12-20 ENCOUNTER — Ambulatory Visit (HOSPITAL_COMMUNITY)
Admit: 2015-12-20 | Discharge: 2015-12-20 | Disposition: A | Discharge status: Home / Self Care | Payer: BLUE CROSS/BLUE SHIELD | Attending: Oncology | Admitting: Oncology

## 2015-12-20 ENCOUNTER — Encounter (HOSPITAL_COMMUNITY): Payer: Self-pay

## 2015-12-20 ENCOUNTER — Other Ambulatory Visit: Payer: Self-pay | Admitting: Oncology

## 2015-12-20 DIAGNOSIS — Z95828 Presence of other vascular implants and grafts: Secondary | ICD-10-CM

## 2015-12-20 DIAGNOSIS — Z86718 Personal history of other venous thrombosis and embolism: Secondary | ICD-10-CM | POA: Insufficient documentation

## 2015-12-20 DIAGNOSIS — Z7901 Long term (current) use of anticoagulants: Secondary | ICD-10-CM | POA: Diagnosis not present

## 2015-12-20 DIAGNOSIS — C539 Malignant neoplasm of cervix uteri, unspecified: Secondary | ICD-10-CM | POA: Insufficient documentation

## 2015-12-20 DIAGNOSIS — Z452 Encounter for adjustment and management of vascular access device: Secondary | ICD-10-CM | POA: Insufficient documentation

## 2015-12-20 DIAGNOSIS — Z923 Personal history of irradiation: Secondary | ICD-10-CM | POA: Diagnosis not present

## 2015-12-20 DIAGNOSIS — C779 Secondary and unspecified malignant neoplasm of lymph node, unspecified: Secondary | ICD-10-CM | POA: Diagnosis not present

## 2015-12-20 DIAGNOSIS — I82621 Acute embolism and thrombosis of deep veins of right upper extremity: Secondary | ICD-10-CM

## 2015-12-20 HISTORY — PX: IR GENERIC HISTORICAL: IMG1180011

## 2015-12-20 LAB — BASIC METABOLIC PANEL
Anion gap: 7 (ref 5–15)
BUN: 12 mg/dL (ref 6–20)
CHLORIDE: 105 mmol/L (ref 101–111)
CO2: 25 mmol/L (ref 22–32)
CREATININE: 0.82 mg/dL (ref 0.44–1.00)
Calcium: 9.3 mg/dL (ref 8.9–10.3)
GFR calc Af Amer: >60 mL/min (ref >60)
GFR calc non Af Amer: >60 mL/min (ref >60)
Glucose, Bld: 99 mg/dL (ref 65–99)
Potassium: 3.7 mmol/L (ref 3.5–5.1)
Sodium: 137 mmol/L (ref 135–145)

## 2015-12-20 LAB — CBC WITH DIFFERENTIAL/PLATELET
Basophils Absolute: 0 10*3/uL (ref 0.0–0.1)
Basophils Relative: 0 %
EOS ABS: 0.1 10*3/uL (ref 0.0–0.7)
EOS PCT: 2 %
HCT: 29.1 % — ABNORMAL LOW (ref 36.0–46.0)
HEMOGLOBIN: 10.1 g/dL — AB (ref 12.0–15.0)
LYMPHS ABS: 0.5 10*3/uL — AB (ref 0.7–4.0)
Lymphocytes Relative: 14 %
MCH: 32.8 pg (ref 26.0–34.0)
MCHC: 34.7 g/dL (ref 30.0–36.0)
MCV: 94.5 fL (ref 78.0–100.0)
MONOS PCT: 10 %
Monocytes Absolute: 0.3 10*3/uL (ref 0.1–1.0)
NEUTROS PCT: 74 %
Neutro Abs: 2.4 10*3/uL (ref 1.7–7.7)
Platelets: 266 10*3/uL (ref 150–400)
RBC: 3.08 MIL/uL — ABNORMAL LOW (ref 3.87–5.11)
RDW: 18.5 % — ABNORMAL HIGH (ref 11.5–15.5)
WBC: 3.3 10*3/uL — ABNORMAL LOW (ref 4.0–10.5)

## 2015-12-20 LAB — PROTIME-INR
INR: 0.91
Prothrombin Time: 12.3 seconds (ref 11.4–15.2)

## 2015-12-20 MED ORDER — SODIUM CHLORIDE 0.9 % IV SOLN
INTRAVENOUS | Status: DC
  Administered 2015-12-20: 12:00:00 via INTRAVENOUS

## 2015-12-20 MED ORDER — LIDOCAINE HCL 1 % IJ SOLN
INTRAMUSCULAR | Status: DC | PRN
  Administered 2015-12-20: 10 mL via INTRADERMAL

## 2015-12-20 MED ORDER — LIDOCAINE HCL 1 % IJ SOLN
INTRAMUSCULAR | Status: AC
  Filled 2015-12-20: qty 20

## 2015-12-20 MED ORDER — EPINEPHRINE HCL 1 MG/ML IJ SOLN
INTRAMUSCULAR | Status: AC
  Filled 2015-12-20: qty 1

## 2015-12-20 MED ORDER — CEFAZOLIN SODIUM-DEXTROSE 2-4 GM/100ML-% IV SOLN
2.0000 g | INTRAVENOUS | Status: AC
  Administered 2015-12-20: 2 g via INTRAVENOUS
  Filled 2015-12-20: qty 100

## 2015-12-20 NOTE — Discharge Instructions (Signed)
Incision Care °An incision is when a surgeon cuts into your body. After surgery, the incision needs to be cared for properly to prevent infection.  °HOW TO CARE FOR YOUR INCISION °· Take medicines only as directed by your health care provider. °· There are many different ways to close and cover an incision, including stitches, skin glue, and adhesive strips. Follow your health care provider's instructions on: °¨ Incision care. °¨ Bandage (dressing) changes and removal. °¨ Incision closure removal. °· Do not take baths, swim, or use a hot tub until your health care provider approves. You may shower as directed by your health care provider. °· Resume your normal diet and activities as directed. °· Use anti-itch medicine (such as an antihistamine) as directed by your health care provider. The incision may itch while it is healing. Do not pick or scratch at the incision. °· Drink enough fluid to keep your urine clear or pale yellow. °SEEK MEDICAL CARE IF:  °· You have drainage, redness, swelling, or pain at your incision site. °· You have muscle aches, chills, or a general ill feeling. °· You notice a bad smell coming from the incision or dressing. °· Your incision edges separate after the sutures, staples, or skin adhesive strips have been removed. °· You have persistent nausea or vomiting. °· You have a fever. °· You are dizzy. °SEEK IMMEDIATE MEDICAL CARE IF:  °· You have a rash. °· You faint. °· You have difficulty breathing. °MAKE SURE YOU:  °· Understand these instructions. °· Will watch your condition. °· Will get help right away if you are not doing well or get worse. °  °This information is not intended to replace advice given to you by your health care provider. Make sure you discuss any questions you have with your health care provider. °  °Document Released: 10/28/2004 Document Revised: 05/01/2014 Document Reviewed: 06/04/2013 °Elsevier Interactive Patient Education ©2016 Elsevier Inc. ° °

## 2015-12-20 NOTE — Procedures (Signed)
Successful removal of right anterior chest wall port-a-cath. EBL: Minimal No immediate post procedural complications.   Jay Latiqua Daloia, MD Pager #: [phone]  

## 2015-12-20 NOTE — H&P (Signed)
Chief Complaint: cervical cancer  Referring Physician:Dr. Evlyn Clines  Supervising Physician: Sandi Mariscal  Patient Status:  Out-pt  HPI: Lindsey Hughes is an 45 y.o. female with a history of cervical cancer with some spread to her nearby lymph nodes.  She had a PAC placed last year.  She has been undergoing treatment.  She is scheduled for a PET scan next month to determine if she needs further treatment.  She has a history of a DVT in her right arm for which she is on coumadin for and has been transitioned to Lovenox prior to her port removal.  She has been having some swelling in her neck from her PAC.  Because of some of these issues, a request has been made for removal.  Past Medical History:  Past Medical History:  Diagnosis Date  . Anemia   . Anticoagulated on Coumadin    started 11-26-2015 for right upper arm deep and superficial vein thrombus  . Cervical cancer [facility]) oncologist-  dr livesay/  dr kinard/  dr Denman George   dx 08/2015-- Stage IIB poorly differentiated SCC--  chemotherapy weekly (started 10-04-2015)  and external radiation (started 10-05-2015) and planned high-dose radiation via tandom ring direct to cervix to start 11-09-2015  . Chemotherapy induced diarrhea   . Chemotherapy-induced nausea   . Deep vein thrombosis, upper right extremity ([place]) 11-25-2015 dx   deep and superficial involving axillary vein, brachial vein, and basilic vein  . Depression   . Dysuria   . GERD (gastroesophageal reflux disease)    caused by chemotherapy  . Headache   . History of thrombophlebitis RESOLVED-- but per pt prefer no bp or  puncture done in right arm   per pt symptoms 10-20-2015-- on 10-22-2015 dx Superficial thrombus RUE of right basilic vein,where IV access was difficult for first peripheral chemo (not deep venous system) / symptoms resolved with local measures  . Hypothyroidism   . Port-a-cath in place   . Radiation    currently having radiation therapy started 10-04-2015   . Superficial thrombophlebitis of arm 11/25/2015   right upper arm-- secondary to IV access difficult for first chemo    Past Surgical History:  Past Surgical History:  Procedure Laterality Date  . CARPAL TUNNEL RELEASE Bilateral 2002 and 2009  . CESAREAN SECTION  1991   . DILATION AND CURETTAGE OF UTERUS N/A 09/03/2015   Procedure: EXAM UNDER ANESTHESIA  WITH FROZEN SECTION CERVICAL BIOPSY & ENDOCERVICAL CURRETTINGS;  Surgeon: Aloha Gell, MD;  Location: [facility];  Service: Gynecology;  Laterality: N/A;  . ECHO STRESS TEST  12-10-2014   normal w/ no evidence for stress-induced ischemia/  normal LV function and wall motion , ef 65-70%  . IR GENERIC HISTORICAL  10/25/2015   IR RADIOLOGIST EVAL & MGMT 10/25/2015 Corrie Mckusick, DO [facility]-INTERV RAD  . LASIK Left 2015  . PORTACATH PLACEMENT  10-11-2015  . TANDEM RING INSERTION N/A 11/09/2015   Procedure: TANDEM RING INSERTION;  Surgeon: Gery Pray, MD;  Location: [facility];  Service: Urology;  Laterality: N/A;  . TANDEM RING INSERTION N/A 11/16/2015   Procedure: TANDEM RING INSERTION;  Surgeon: Gery Pray, MD;  Location: [facility];  Service: Urology;  Laterality: N/A;  . TANDEM RING INSERTION N/A 11/23/2015   Procedure: TANDEM RING INSERTION;  Surgeon: Gery Pray, MD;  Location: [facility];  Service: Urology;  Laterality: N/A;  . TANDEM RING INSERTION N/A 11/30/2015   Procedure: TANDEM RING INSERTION;  Surgeon:  Gery Pray, MD;  Location: [facility];  Service: Urology;  Laterality: N/A;  . TANDEM RING INSERTION N/A 12/07/2015   Procedure: TANDEM RING INSERTION;  Surgeon: Gery Pray, MD;  Location: [facility];  Service: Urology;  Laterality: N/A;  . TUBAL LIGATION  1996    Family History:  Family History  Problem Relation Age of Onset  . CAD Mother     Premature disease  . Lung cancer Mother   . Lung cancer Father   . Brain cancer Father   . CAD Brother      MI in his 6s    Social History:  reports that she has never smoked. She has never used smokeless tobacco. She reports that she does not drink alcohol or use drugs.  Allergies:  Allergies  Allergen Reactions  . Zofran [Ondansetron Hcl] Other (See Comments)    headache    Medications: Medications reviewed in Epic  Please HPI for pertinent positives, otherwise complete 10 system ROS negative.  Mallampati Score: MD Evaluation Airway: WNL Heart: WNL Abdomen: WNL Chest/ Lungs: WNL ASA  Classification: 2 Mallampati/Airway Score: Two  Physical Exam: BP 127/88 (BP Location: Left Arm)   Pulse 82   Temp 98 F (36.7 C) (Oral)   Resp 16   SpO2 100%  There is no height or weight on file to calculate BMI. General: pleasant, obese white female who is sitting up in a chair in NAD HEENT: head is normocephalic, atraumatic.  Sclera are noninjected.  PERRL.  Ears and nose without any masses or lesions.  Mouth is pink and moist Heart: regular, rate, and rhythm.  Normal s1,s2. No obvious murmurs, gallops, or rubs noted.  Palpable radial and pedal pulses bilaterally Lungs/chest: CTAB, no wheezes, rhonchi, or rales noted.  Respiratory effort nonlabored.  PAC in right upper chest Abd: soft, NT, ND, +BS, no masses, hernias, or organomegaly MS: all 4 extremities are symmetrical with no cyanosis, clubbing, or edema. Psych: A&Ox3 with an appropriate affect.   Labs: Results for orders placed or performed during the hospital encounter of 12/20/15 (from the past 48 hour(s))  Basic metabolic panel     Status: None   Collection Time: 12/20/15 11:34 AM  Result Value Ref Range   Sodium 137 135 - 145 mmol/L   Potassium 3.7 3.5 - 5.1 mmol/L   Chloride 105 101 - 111 mmol/L   CO2 25 22 - 32 mmol/L   Glucose, Bld 99 65 - 99 mg/dL   BUN 12 6 - 20 mg/dL   Creatinine, Ser 0.82 0.44 - 1.00 mg/dL   Calcium 9.3 8.9 - 10.3 mg/dL   GFR calc non Af Amer >60 >60 mL/min   GFR calc Af Amer >60 >60 mL/min     Comment: (NOTE) The eGFR has been calculated using the CKD EPI equation. This calculation has not been validated in all clinical situations. eGFR's persistently <60 mL/min signify possible Chronic Kidney Disease.    Anion gap 7 5 - 15  CBC with Differential/Platelet     Status: Abnormal   Collection Time: 12/20/15 11:34 AM  Result Value Ref Range   WBC 3.3 (L) 4.0 - 10.5 K/uL   RBC 3.08 (L) 3.87 - 5.11 MIL/uL   Hemoglobin 10.1 (L) 12.0 - 15.0 g/dL   HCT 29.1 (L) 36.0 - 46.0 %   MCV 94.5 78.0 - 100.0 fL   MCH 32.8 26.0 - 34.0 pg   MCHC 34.7 30.0 - 36.0 g/dL   RDW  18.5 (H) 11.5 - 15.5 %   Platelets 266 150 - 400 K/uL   Neutrophils Relative % 74 %   Neutro Abs 2.4 1.7 - 7.7 K/uL   Lymphocytes Relative 14 %   Lymphs Abs 0.5 (L) 0.7 - 4.0 K/uL   Monocytes Relative 10 %   Monocytes Absolute 0.3 0.1 - 1.0 K/uL   Eosinophils Relative 2 %   Eosinophils Absolute 0.1 0.0 - 0.7 K/uL   Basophils Relative 0 %   Basophils Absolute 0.0 0.0 - 0.1 K/uL  Protime-INR     Status: None   Collection Time: 12/20/15 11:34 AM  Result Value Ref Range   Prothrombin Time 12.3 11.4 - 15.2 seconds   INR 0.91     Imaging: No results found.  Assessment/Plan 1. Cervical cancer -we will plan to remove her port a cath today. -labs and vitals have been reviewed -the procedure including risks and complications such as bleeding and infection were discussed with the patient.  She understands and is agreeable to proceed.  Her consent has been signed and is on the chart.  Thank you for this interesting consult.  I greatly enjoyed meeting Lirio Pleitez and look forward to participating in their care.  A copy of this report was sent to the requesting provider on this date.  Electronically Signed: Henreitta Cea 12/20/2015, 1:16 PM   I spent a total of  30 Minutes   in face to face in clinical consultation, greater than 50% of which was counseling/coordinating care for cervical cancer

## 2015-12-20 NOTE — Telephone Encounter (Signed)
"  What time am I to have my port-a-cath removed today, 11:00  or 1:00 pm?" Advised to arrive by 11:15 for the 11:30 am pre-procedure regimen before the 1:30 pm removal.  Has been NPO,aware to have driver and has stopped coumadin.

## 2015-12-22 ENCOUNTER — Telehealth: Payer: Self-pay | Admitting: *Deleted

## 2015-12-22 NOTE — Telephone Encounter (Signed)
"  Can I have lab appointment moved to 12-24-2015 so I do not have to come there two days in a row." 12-23-2015 appointment includes Lab and F/U. MD.  12-24-2015 appointment for GYN F/U.  No availability to interchange these provider dates.  Patient verbalized understanding.

## 2015-12-23 ENCOUNTER — Telehealth: Payer: Self-pay | Admitting: Oncology

## 2015-12-23 ENCOUNTER — Ambulatory Visit (HOSPITAL_BASED_OUTPATIENT_CLINIC_OR_DEPARTMENT_OTHER): Payer: BLUE CROSS/BLUE SHIELD | Admitting: Oncology

## 2015-12-23 ENCOUNTER — Other Ambulatory Visit (HOSPITAL_BASED_OUTPATIENT_CLINIC_OR_DEPARTMENT_OTHER): Payer: BLUE CROSS/BLUE SHIELD

## 2015-12-23 ENCOUNTER — Encounter: Payer: Self-pay | Admitting: Oncology

## 2015-12-23 VITALS — BP 117/74 | HR 98 | Temp 98.3°F | Resp 18 | Ht 62.0 in | Wt 203.2 lb

## 2015-12-23 DIAGNOSIS — C531 Malignant neoplasm of exocervix: Secondary | ICD-10-CM

## 2015-12-23 DIAGNOSIS — I82621 Acute embolism and thrombosis of deep veins of right upper extremity: Secondary | ICD-10-CM | POA: Diagnosis not present

## 2015-12-23 DIAGNOSIS — Z7901 Long term (current) use of anticoagulants: Secondary | ICD-10-CM | POA: Diagnosis not present

## 2015-12-23 DIAGNOSIS — E039 Hypothyroidism, unspecified: Secondary | ICD-10-CM | POA: Diagnosis not present

## 2015-12-23 DIAGNOSIS — C539 Malignant neoplasm of cervix uteri, unspecified: Secondary | ICD-10-CM

## 2015-12-23 LAB — CBC WITH DIFFERENTIAL/PLATELET
BASO%: 0.2 % (ref 0.0–2.0)
BASOS ABS: 0 10*3/uL (ref 0.0–0.1)
EOS ABS: 0.1 10*3/uL (ref 0.0–0.5)
EOS%: 1.7 % (ref 0.0–7.0)
HCT: 32.8 % — ABNORMAL LOW (ref 34.8–46.6)
HEMOGLOBIN: 11.3 g/dL — AB (ref 11.6–15.9)
LYMPH#: 0.4 10*3/uL — AB (ref 0.9–3.3)
LYMPH%: 10.2 % — ABNORMAL LOW (ref 14.0–49.7)
MCH: 32.1 pg (ref 25.1–34.0)
MCHC: 34.5 g/dL (ref 31.5–36.0)
MCV: 93.2 fL (ref 79.5–101.0)
MONO#: 0.5 10*3/uL (ref 0.1–0.9)
MONO%: 12.2 % (ref 0.0–14.0)
NEUT%: 75.7 % (ref 38.4–76.8)
NEUTROS ABS: 3.1 10*3/uL (ref 1.5–6.5)
NRBC: 0 % (ref 0–0)
PLATELETS: 257 10*3/uL (ref 145–400)
RBC: 3.52 10*6/uL — AB (ref 3.70–5.45)
RDW: 18.1 % — AB (ref 11.2–14.5)
WBC: 4.1 10*3/uL (ref 3.9–10.3)

## 2015-12-23 LAB — COMPREHENSIVE METABOLIC PANEL
ALBUMIN: 3.6 g/dL (ref 3.5–5.0)
ALK PHOS: 73 U/L (ref 40–150)
ALT: 64 U/L — AB (ref 0–55)
ANION GAP: 11 meq/L (ref 3–11)
AST: 59 U/L — ABNORMAL HIGH (ref 5–34)
BILIRUBIN TOTAL: 0.41 mg/dL (ref 0.20–1.20)
BUN: 14.8 mg/dL (ref 7.0–26.0)
CO2: 25 meq/L (ref 22–29)
CREATININE: 1 mg/dL (ref 0.6–1.1)
Calcium: 9.8 mg/dL (ref 8.4–10.4)
Chloride: 104 mEq/L (ref 98–109)
EGFR: 71 mL/min/{1.73_m2} — AB (ref >90)
GLUCOSE: 132 mg/dL (ref 70–140)
Potassium: 3.6 mEq/L (ref 3.5–5.1)
Sodium: 140 mEq/L (ref 136–145)
TOTAL PROTEIN: 7.2 g/dL (ref 6.4–8.3)

## 2015-12-23 LAB — MAGNESIUM: Magnesium: 1.9 mg/dl (ref 1.5–2.5)

## 2015-12-23 LAB — PROTIME-INR
INR: 1 — AB (ref 2.00–3.50)
PROTIME: 12 s (ref 10.6–13.4)

## 2015-12-23 NOTE — Progress Notes (Signed)
OFFICE PROGRESS NOTE   December 23, 2015   Physicians:Emma Rossi,[place], [place], [place]* (PCP [facility]), Gery Pray, Aloha Gell   INTERVAL HISTORY:  Patient is seen, alone for visit, for management of recent RUE DVT and in follow up of recently completed treatment for IIB poorly differentiated squamous cell carcinoma of cervix.  She completed 6 cycles of weekly sensitizing CDDP 11-15-15, given with pelvic radiation. She completed HDR on 12-07-15. She is to see Dr Denman George on 12-24-15 and Dr Sondra Come on 01-13-16. Right sided PAC was removed on 12-20-15.  Patient had superficial thrombus RUE basilic vein 07-26-01, then acute DVT right axillary, brachial and basilic veins 07-29-40. She was kept on lovenox for removal of PAC on 12-20-15. Despite instructions, she did not resume lovenox after PAC removed, and took coumadin at 5 mg instead of 10 mg daily. INR today is only 1.0, however fortunately has had no increased swelling or discomfort RUE or other concern for new clots.   Patient reports that she has felt well otherwise, with no symptoms other than very bothersome hot flashes that seem referable to recent treatments. As she will be anticoagulated, likely reasonable to receive short term supplemental estrogen if Dr Denman George agrees at her visit on 12-24-15.  Patient denies pelvic discomfort or bleeding, bladder or bowel symptoms, fever, LE swelling, nausea, SOB. She has minimal discomfort at site of PAC removal. Remainder of 10 point Review of Systems negative.    51 yo stepdaughter who lives with patient and husband is now on home dialysis x 12 hrs daily. The child continues at regular school.  ONCOLOGIC HISTORY Patient has history of abnormal PAPs ~ 5-7 years ago, not followed up. She presented to PCP with menorrhagia x 3-4 months, which progressed to continuous bleeding, Evaluation by Dr Pamala Hurry was complicated by cervical stenosis, and bleeding did not improve with hormonal intervention. [place]  07-15-15 showed normal right ovary, probably normal left ovary and uterine fibroid. Endometrial biopsy was accomplished on 08-12-15, pathology "weakly proliferative endometrium with glandular and stromal breakdown and abundant blood, no endometrial hyperplasia identified". She was taken for planned robotic hysterectomy by Dr Pamala Hurry on 09-03-15, however exam under anesthesia revealed abnormalities in posterior lip of cervix and hysterectomy not done. Biopsy ([id number] invasive poorly differentiated squamous cell carcinoma with involvement of endocervix and focal vascular invasion. 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 She was seen in consultation by Dr Denman George on 09-16-15, with posterior lip of cervix replaced by 6 cm tumor, with tumor beginning to infiltrate into bilateral parametria. Recommendation is for radiation with sensitizing chemotherapy. PET 09-17-15 showed intensely hypermetabolic cervical mass 5.2 x 3.3 cm (SUV 42), mild hypermetabolic uptake thru endometrium (SUV 6.2) and in high left adnexa without CT correlate, bilateral external iliac nodes 0.8 - 1.1 cm with low level uptake.Recommendation is for sensitizing chemotherapy with radiation. Weekly CDDP given x 6 from 10-04-15 thru 11-15-15. Last HDR 12-07-15.   Objective:  Vital signs in last 24 hours:  BP 117/74 (BP Location: Left Arm, Patient Position: Sitting)   Pulse 98   Temp 98.3 F (36.8 C) (Oral)   Resp 18   Ht 5' 2"  (1.575 m)   Wt 203 lb 3.2 oz (92.2 kg)   SpO2 100%   BMI 37.17 kg/m  Weight down 3 lbs. Alert, oriented and appropriate. Ambulatory without difficulty.  No alopecia  HEENT:PERRL, sclerae not icteric. Oral mucosa moist without lesions, posterior pharynx clear.  Neck supple. No JVD.  Lymphatics:no cervical,supraclavicular adenopathy Resp: clear to auscultation  bilaterally  Cardio: regular rate and rhythm. No gallop. GI: abdomen soft, nontender, not distended, no mass or organomegaly. Normally active bowel sounds.   Musculoskeletal/ Extremities: without pitting edema, cords, tenderness. Slightly tender medial RUE at antecubital fossa without clear cord, no erythema or swelling. Radial pulses fine Neuro: no peripheral neuropathy. Otherwise nonfocal Skin without rash, ecchymosis, petechiae Dressing removed from site of previous PAC, incision closed and not remarkable  Lab Results:  Results for orders placed or performed in visit on 12/23/15  CBC with Differential  Result Value Ref Range   WBC 4.1 3.9 - 10.3 10e3/uL   NEUT# 3.1 1.5 - 6.5 10e3/uL   HGB 11.3 (L) 11.6 - 15.9 g/dL   HCT 32.8 (L) 34.8 - 46.6 %   Platelets 257 145 - 400 10e3/uL   MCV 93.2 79.5 - 101.0 fL   MCH 32.1 25.1 - 34.0 pg   MCHC 34.5 31.5 - 36.0 g/dL   RBC 3.52 (L) 3.70 - 5.45 10e6/uL   RDW 18.1 (H) 11.2 - 14.5 %   lymph# 0.4 (L) 0.9 - 3.3 10e3/uL   MONO# 0.5 0.1 - 0.9 10e3/uL   Eosinophils Absolute 0.1 0.0 - 0.5 10e3/uL   Basophils Absolute 0.0 0.0 - 0.1 10e3/uL   NEUT% 75.7 38.4 - 76.8 %   LYMPH% 10.2 (L) 14.0 - 49.7 %   MONO% 12.2 0.0 - 14.0 %   EOS% 1.7 0.0 - 7.0 %   BASO% 0.2 0.0 - 2.0 %   nRBC 0 0 - 0 %  Comprehensive metabolic panel  Result Value Ref Range   Sodium 140 136 - 145 mEq/L   Potassium 3.6 3.5 - 5.1 mEq/L   Chloride 104 98 - 109 mEq/L   CO2 25 22 - 29 mEq/L   Glucose 132 70 - 140 mg/dl   BUN 14.8 7.0 - 26.0 mg/dL   Creatinine 1.0 0.6 - 1.1 mg/dL   Total Bilirubin 0.41 0.20 - 1.20 mg/dL   Alkaline Phosphatase 73 40 - 150 U/L   AST 59 (H) 5 - 34 U/L   ALT 64 (H) 0 - 55 U/L   Total Protein 7.2 6.4 - 8.3 g/dL   Albumin 3.6 3.5 - 5.0 g/dL   Calcium 9.8 8.4 - 10.4 mg/dL   Anion Gap 11 3 - 11 mEq/L   EGFR 71 (L) >90 ml/min/1.73 m2  Magnesium  Result Value Ref Range   Magnesium 1.9 1.5 - 2.5 mg/dl  Protime-INR  Result Value Ref Range   Protime 12.0 10.6 - 13.4 Seconds   INR 1.00 (L) 2.00 - 3.50   Lovenox No      Studies/Results:  No results found.  Further imaging per Drs Denman George and  Kinard  Medications: I have reviewed the patient's current medications. Written and oral instructions as follows: Resume lovenox shots today. Will continue daily lovenox until coumadin therapeutic. Coumadin 10 mg daily starting today thru 9-4 Will repeat INR on 9-5 (office closed 9-4 for Labor Day)    DISCUSSION Medications as above, carefully reviewed with patient, who seems to understand. Patient understands that she will have exam by Dr Denman George at visit on 9-1, with scans likely to be set up from that visit.   If NED in next few months, could consider changing from coumadin to xarelto depending on financial situation. (Note assistance for lovenox available, however 0 copay for that drug on her insurance - managed care documentation).    Assessment/Plan:  1.IIB poorly differentiated squamous cell carcinoma of  cervix with PET + bilateral external iliac nodes but no obvious distant disease. Weekly sensitizing CDDP with radiation given x 6 cycles from 10-04-15 thru  11-15-15 and HDR thru 12-07-15. She is to see Dr Denman George 12-24-15. Further imaging per Dr Denman George and Dr Sondra Come. 2.RUE DVT 11-25-15 with superficial thrombus: on side of PAC tho neck CT did not show clot at [facility], however with treatment completed seemed best to remove PAC, done 12-20-15. Expect needs anticoagulation 4-6 months as long as no active cancer at that point. Needs lovenox bridge until coumadin therapeutic. Repeat INR on 12-28-15 , lab at least weekly next couple of weeks until stable and patient clearly compliant. 3.hypothyroid: medication as instructed by PCP: PET shows some uptake in left lobe thyroid without discreeet nodule on CT imaging. May need thyroid US at least after completion of this treatment if not already done by PCP (per patient's history, probably has not had thyroid US) 4.social stress with 25 yo step daughter recently diagnosed with renal failure, apparently congenital renal problems not previously known. On wait list  for renal transplant. In and out of [facility]. Now on home dialysis x 12 hrs daily 5.multiple bowel movements daily as baseline. 6.Vasomotor symptoms with treatment induced menopause. She will discuss hormone replacement with Dr Denman George. As she will be on full anticoagulation, that should still be reasonable  7.PAC in 8.UTIs treated and resolved 9.chemo nausea and GERD: resolved/ much better with compazine and protonix 10.post bilateral carpal tunnel surgery and BTL   All questions answered. Time spent 25 min including >50% counseling and coordination of care. Route PCP, cc Drs Denman George and Delena Serve, MD   12/23/2015, 7:29 PM

## 2015-12-23 NOTE — Telephone Encounter (Signed)
appt made and avs printed °

## 2015-12-24 ENCOUNTER — Ambulatory Visit: Payer: BLUE CROSS/BLUE SHIELD | Attending: Gynecologic Oncology | Admitting: Gynecologic Oncology

## 2015-12-24 ENCOUNTER — Encounter: Payer: Self-pay | Admitting: Gynecologic Oncology

## 2015-12-24 VITALS — BP 136/78 | HR 72 | Temp 98.4°F | Resp 18 | Ht 62.0 in | Wt 203.1 lb

## 2015-12-24 DIAGNOSIS — Z808 Family history of malignant neoplasm of other organs or systems: Secondary | ICD-10-CM | POA: Diagnosis not present

## 2015-12-24 DIAGNOSIS — E039 Hypothyroidism, unspecified: Secondary | ICD-10-CM | POA: Diagnosis not present

## 2015-12-24 DIAGNOSIS — Z8249 Family history of ischemic heart disease and other diseases of the circulatory system: Secondary | ICD-10-CM | POA: Insufficient documentation

## 2015-12-24 DIAGNOSIS — D649 Anemia, unspecified: Secondary | ICD-10-CM | POA: Diagnosis not present

## 2015-12-24 DIAGNOSIS — E28310 Symptomatic premature menopause: Secondary | ICD-10-CM | POA: Diagnosis not present

## 2015-12-24 DIAGNOSIS — Z9851 Tubal ligation status: Secondary | ICD-10-CM | POA: Diagnosis not present

## 2015-12-24 DIAGNOSIS — Z959 Presence of cardiac and vascular implant and graft, unspecified: Secondary | ICD-10-CM | POA: Diagnosis not present

## 2015-12-24 DIAGNOSIS — C539 Malignant neoplasm of cervix uteri, unspecified: Secondary | ICD-10-CM | POA: Insufficient documentation

## 2015-12-24 DIAGNOSIS — Z888 Allergy status to other drugs, medicaments and biological substances status: Secondary | ICD-10-CM | POA: Diagnosis not present

## 2015-12-24 DIAGNOSIS — Z801 Family history of malignant neoplasm of trachea, bronchus and lung: Secondary | ICD-10-CM | POA: Diagnosis not present

## 2015-12-24 DIAGNOSIS — Z923 Personal history of irradiation: Secondary | ICD-10-CM | POA: Insufficient documentation

## 2015-12-24 DIAGNOSIS — Z86718 Personal history of other venous thrombosis and embolism: Secondary | ICD-10-CM | POA: Insufficient documentation

## 2015-12-24 DIAGNOSIS — Z7901 Long term (current) use of anticoagulants: Secondary | ICD-10-CM | POA: Diagnosis not present

## 2015-12-24 DIAGNOSIS — Z8672 Personal history of thrombophlebitis: Secondary | ICD-10-CM | POA: Insufficient documentation

## 2015-12-24 DIAGNOSIS — K219 Gastro-esophageal reflux disease without esophagitis: Secondary | ICD-10-CM | POA: Diagnosis not present

## 2015-12-24 DIAGNOSIS — C531 Malignant neoplasm of exocervix: Secondary | ICD-10-CM

## 2015-12-24 DIAGNOSIS — E28319 Asymptomatic premature menopause: Secondary | ICD-10-CM

## 2015-12-24 DIAGNOSIS — Z8541 Personal history of malignant neoplasm of cervix uteri: Secondary | ICD-10-CM

## 2015-12-24 DIAGNOSIS — Z9221 Personal history of antineoplastic chemotherapy: Secondary | ICD-10-CM | POA: Insufficient documentation

## 2015-12-24 DIAGNOSIS — F329 Major depressive disorder, single episode, unspecified: Secondary | ICD-10-CM | POA: Insufficient documentation

## 2015-12-24 MED ORDER — CONJ ESTROG-MEDROXYPROGEST ACE 0.45-1.5 MG PO TABS: 1.0000 | ORAL_TABLET | Freq: Every day | ORAL | 6 refills | Status: DC

## 2015-12-24 MED FILL — PREMPRO 0.45-1.5 MG TABLET: 0.45-1.5 | 28 days supply | Qty: 28 | Fill #0

## 2015-12-24 NOTE — Progress Notes (Signed)
Consult Note: Gyn-Onc  Consult was requested by Dr. Pamala Hurry for the evaluation of Lindsey Hughes 45 y.o. female  CC:  Chief Complaint  Patient presents with  . Cervical Cancer    follow up visit    Assessment/Plan:  Lindsey Hughes  is a 45 y.o.  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  Complete clinical response.  Recommend restaging PET 6 weeks post treatment  Will see her for follow-up in 3 months.  Will prescribe Prempro for her to take for radiation induced premature menopause.  HPI: Lindsey Hughes is a very pleasant 45 year old who is seen in consultation at the request of Dr Pamala Hurry for cervical cancer.  The patient reports a 4 month history of peristent daily vaginal bleeding. She has also had back pain and more recently random urinary incontinence.  She cannot remember when she last had a pap smear (5-10 years ago) but she does recall having a history of abnormal paps in the past, though was not treated for these and feels that she "was never told they needed treatment".   As workup for her bleeding she saw Dr Pamala Hurry. An office endometrial biopsy was performed on 08/12/15 and revealed benign, weakly proliferative endoemetrium. During that office exam the posterior lip of the cervix was not visualized due to positioning of the uterus and body habitus. Passage of the pipelle was difficult due to obstruction at the endocervix. Pap was taken but was not resulted by the time of her scheduled hysterectomy. It later returned as ASC-H, positive for high risk HPV.  The patient was scheduled for a robotic hysterectomy and on 09/03/15 was taken to the OR. Intraoperative findings were significant for a very abnormal posterior lip of cervix which was nodular and friable. The cervix could be visualized intra-op under general anesthetic. Because it was clearly abnormal she underwent biopsy by Dr  Pamala Hurry which returned positive for poorly differentiated squamous cell carcinoma. The procedure was aborted and a referral was made to [place].  Interval Hx: PET scan on 09/17/15 showed hypermetabolism of a 5.2cm cervical mass and bilateral external iliac nodes with increased FDG uptake suggestive of metastatic disease. Between 10/04/15 to 11/19/15 she received external beam radiation (45 gy in 25 fractions) with radiosensitizing Lindsey Hughes. Between 11/09/15- 12/07/15 she received intracavitary brachytherapy (HDR) with 27.5Gy given in 5 fractions. She additionally received a pelvic boost of 9 Gy. She tolerated treatment well with no major GI or GU toxicity.  She did develop a Rt UE DVT for which her port was removed and she was started on lovenox and coumadin.  She did develop symptoms of menopause with hot flashes.  Current Meds:  Outpatient Encounter Prescriptions as of 12/24/2015  Medication Sig  . acetaminophen (TYLENOL) 500 MG tablet Take 500 mg by mouth every 6 (six) hours as needed. Reported on 11/02/2015  . ALPRAZolam (XANAX) 0.25 MG tablet Take 0.25 mg by mouth as needed for anxiety. Reported on 10/21/2015  . diphenoxylate-atropine (LOMOTIL) 2.5-0.025 MG tablet Take 2 tablets by mouth 4 (four) times daily as needed for diarrhea or loose stools.  . enoxaparin (LOVENOX) 150 MG/ML injection Inject 1 mL (150 mg total) into the skin daily. As directed for DVT  . hydrocortisone (ANUSOL-HC) 2.5 % rectal cream Place 1 application rectally 2 (two) times daily. Called in to [facility] on 11/23/15.  . hydrocortisone (ANUSOL-HC) 25 MG suppository Place 25 mg rectally 2 (  two) times daily. Called in to [facility] on 11/23/15. Disp. 12 suppositories.  Marland Kitchen ibuprofen (ADVIL,MOTRIN) 800 MG tablet Take 800 mg by mouth every 8 (eight) hours as needed for mild pain or moderate pain. Reported on 11/02/2015  . loperamide (IMODIUM) 1 MG/5ML solution Take by mouth as needed for diarrhea or loose stools. Reported  on 10/29/2015  . LORazepam (ATIVAN) 1 MG tablet Place 1/2 -1 tabled under the tongue or swallow every 6 hrs as needed for nausea.  Will make drowsy.  . magic mouthwash SOLN Take 5-10 mLs by mouth every 4 (four) hours as needed for mouth pain. 1:1 ratio 2% viscous lidocaine;benadryl, and nystatin  . oxyCODONE-acetaminophen (ROXICET) 5-325 MG tablet Take 1 tablet by mouth every 4 (four) hours as needed for severe pain. Oxycodone/percocet/roxicet, 30 tablets, no refills  . pantoprazole (PROTONIX) 40 MG tablet Take 1 tablet (40 mg total) by mouth daily. (Patient taking differently: Take 40 mg by mouth every morning. )  . potassium chloride SA (K-DUR,KLOR-CON) 20 MEQ tablet Take 1 tab PO BID x 1 day; then decrease to 1 tab PO QD till gone.  . prochlorperazine (COMPAZINE) 10 MG tablet Take 1 tablet (10 mg total) by mouth every 6 (six) hours as needed for nausea or vomiting.  . triamcinolone (KENALOG) 0.1 % paste Apply to mouth ulers twice a day.  . warfarin (COUMADIN) 5 MG tablet Take 2 tablet by mouth daily beginning 11-26-15 and as directed  . estrogen, conjugated,-medroxyprogesterone (PREMPRO) 0.45-1.5 MG tablet Take 1 tablet by mouth daily.   No facility-administered encounter medications on file as of 12/24/2015.     Allergy:  Allergies  Allergen Reactions  . Zofran [Ondansetron Hcl] Other (See Comments)    headache    Social Hx:   Social History   Social History  . Marital status: Married    Spouse name: N/A  . Number of children: 1  . Years of education: N/A   Occupational History  . Not on file.   Social History Main Topics  . Smoking status: Never Smoker  . Smokeless tobacco: Never Used  . Alcohol use No  . Drug use: No  . Sexual activity: Yes    Birth control/ protection: Surgical   Other Topics Concern  . Not on file   Social History Narrative  . No narrative on file    Past Surgical Hx:  Past Surgical History:  Procedure Laterality Date  . CARPAL TUNNEL RELEASE  Bilateral 2002 and 2009  . CESAREAN SECTION  1991   . DILATION AND CURETTAGE OF UTERUS N/A 09/03/2015   Procedure: EXAM UNDER ANESTHESIA  WITH FROZEN SECTION CERVICAL BIOPSY & ENDOCERVICAL CURRETTINGS;  Surgeon: Aloha Gell, MD;  Location: [facility];  Service: Gynecology;  Laterality: N/A;  . ECHO STRESS TEST  12-10-2014   normal w/ no evidence for stress-induced ischemia/  normal LV function and wall motion , ef 65-70%  . IR GENERIC HISTORICAL  10/25/2015   IR RADIOLOGIST EVAL & MGMT 10/25/2015 Corrie Mckusick, DO [facility]  . IR GENERIC HISTORICAL  12/20/2015   IR REMOVAL TUN ACCESS W/ PORT W/O FL MOD SED 12/20/2015 Sandi Mariscal, MD [facility]  . LASIK Left 2015  . PORTACATH PLACEMENT  10-11-2015  . TANDEM RING INSERTION N/A 11/09/2015   Procedure: TANDEM RING INSERTION;  Surgeon: Gery Pray, MD;  Location: [facility];  Service: Urology;  Laterality: N/A;  . TANDEM RING INSERTION N/A 11/16/2015   Procedure: TANDEM RING INSERTION;  Surgeon: Gery Pray, MD;  Location: [facility];  Service: Urology;  Laterality: N/A;  . TANDEM RING INSERTION N/A 11/23/2015   Procedure: TANDEM RING INSERTION;  Surgeon: Gery Pray, MD;  Location: [facility];  Service: Urology;  Laterality: N/A;  . TANDEM RING INSERTION N/A 11/30/2015   Procedure: TANDEM RING INSERTION;  Surgeon: Gery Pray, MD;  Location: [facility];  Service: Urology;  Laterality: N/A;  . TANDEM RING INSERTION N/A 12/07/2015   Procedure: TANDEM RING INSERTION;  Surgeon: Gery Pray, MD;  Location: [facility];  Service: Urology;  Laterality: N/A;  . TUBAL LIGATION  1996    Past Medical Hx:  Past Medical History:  Diagnosis Date  . Anemia   . Anticoagulated on Coumadin    started 11-26-2015 for right upper arm deep and superficial vein thrombus  . Cervical cancer [facility]) oncologist-  dr livesay/  dr kinard/  dr Denman George   dx 08/2015-- Stage IIB poorly differentiated  [facility]--  chemotherapy weekly (started 10-04-2015)  and external radiation (started 10-05-2015) and planned high-dose radiation via tandom ring direct to cervix to start 11-09-2015  . Chemotherapy induced diarrhea   . Chemotherapy-induced nausea   . Deep vein thrombosis, upper right extremity ([place]) 11-25-2015 dx   deep and superficial involving axillary vein, brachial vein, and basilic vein  . Depression   . Dysuria   . GERD (gastroesophageal reflux disease)    caused by chemotherapy  . Headache   . History of thrombophlebitis RESOLVED-- but per pt prefer no bp or  puncture done in right arm   per pt symptoms 10-20-2015-- on 10-22-2015 dx Superficial thrombus RUE of right basilic vein,where IV access was difficult for first peripheral chemo (not deep venous system) / symptoms resolved with local measures  . Hypothyroidism   . Port-a-cath in place   . Radiation    currently having radiation therapy started 10-04-2015  . Superficial thrombophlebitis of arm 11/25/2015   right upper arm-- secondary to IV access difficult for first chemo    Past Gynecological History:  G1P1, + abnormal pap smears remotely. No recent pap history in 5-10 years.  No LMP recorded. Patient is not currently having periods (Reason: Chemotherapy).  Family Hx:  Family History  Problem Relation Age of Onset  . CAD Mother     Premature disease  . Lung cancer Mother   . Lung cancer Father   . Brain cancer Father   . CAD Brother     MI in his 36s    Review of Systems:  Constitutional  + hot flashes  ENT Normal appearing ears and nares bilaterally Skin/Breast  No rash, sores, jaundice, itching, dryness Cardiovascular  No chest pain, shortness of breath, or edema  Pulmonary  No cough or wheeze.  Gastro Intestinal  No nausea, vomitting, or diarrhoea. No bright red blood per rectum, no abdominal pain, change in bowel movement, or constipation.  Genito Urinary  No frequency, urgency, dysuria, no  bleeding Musculo Skeletal  No myalgia, arthralgia, + right knee swelling + right upper extremity edema and tenderness Neurologic  No weakness, numbness, change in gait,  Psychology  No depression, anxiety, insomnia.   Vitals:  Blood pressure 136/78, pulse 72, temperature 98.4 F (36.9 C), temperature source Oral, resp. rate 18, height 5\' 2"  (1.575 m), weight 203 lb 1.6 oz (92.1 kg), SpO2 99 %.  Physical Exam: WD in NAD Neck  Supple NROM, without any enlargements.  Lymph Node Survey No  cervical supraclavicular or inguinal adenopathy Cardiovascular  Pulse normal rate, regularity and rhythm. S1 and S2 normal.  Lungs  Clear to auscultation bilateraly, without wheezes/crackles/rhonchi. Good air movement.  Skin  No rash/lesions/breakdown  Psychiatry  Alert and oriented to person, place, and time  Abdomen  Normoactive bowel sounds, abdomen soft, non-tender and obese without evidence of hernia.  Back No CVA tenderness Genito Urinary  Vulva/vagina: Normal external female genitalia.  No lesions. No discharge or bleeding.  Bladder/urethra:  No lesions or masses, well supported bladder  Vagina: normal with no lesions  Cervix: cervix tilted posteriorally but small with no palpable or visible tumor  Uterus: 10cm minimally mobile, no bilateral parametrial involvement but not to sidewalls  Adnexa: no palpable masses. Rectal  Good tone, no masses no cul de sac nodularity. No parametrial extension minimally bilateraly Extremities  No bilateral cyanosis, clubbing or edema.   Donaciano Eva, MD  12/24/2015, 10:05 AM

## 2015-12-24 NOTE — Patient Instructions (Addendum)
PET scan at end of September.  We will contact you with the results.   Plan to follow up with Dr Everitt Amber in 3 months. Call with any changes , questions or concerns.  Thank you   We will contact you with the date and time for your PET scan once insurance has approved it.

## 2015-12-28 ENCOUNTER — Other Ambulatory Visit (HOSPITAL_BASED_OUTPATIENT_CLINIC_OR_DEPARTMENT_OTHER): Payer: BLUE CROSS/BLUE SHIELD

## 2015-12-28 ENCOUNTER — Telehealth: Payer: Self-pay

## 2015-12-28 DIAGNOSIS — I82621 Acute embolism and thrombosis of deep veins of right upper extremity: Secondary | ICD-10-CM

## 2015-12-28 LAB — PROTIME-INR
INR: 2.4 (ref 2.00–3.50)
PROTIME: 28.8 s — AB (ref 10.6–13.4)

## 2015-12-28 NOTE — Telephone Encounter (Signed)
S/w pt per Dr Mariana Kaufman instruction: Stop lovenox, continue coumadin 10 mg daily, repeat INR 9/11 as planned.

## 2015-12-31 ENCOUNTER — Telehealth: Payer: Self-pay

## 2015-12-31 ENCOUNTER — Other Ambulatory Visit: Payer: Self-pay | Admitting: Gynecologic Oncology

## 2015-12-31 DIAGNOSIS — C539 Malignant neoplasm of cervix uteri, unspecified: Secondary | ICD-10-CM

## 2015-12-31 DIAGNOSIS — E28319 Asymptomatic premature menopause: Secondary | ICD-10-CM

## 2015-12-31 DIAGNOSIS — R232 Flushing: Secondary | ICD-10-CM

## 2015-12-31 MED ORDER — CONJ ESTROG-MEDROXYPROGEST ACE 0.625-2.5 MG PO TABS: 1.0000 | ORAL_TABLET | Freq: Every day | ORAL | 6 refills | Status: DC

## 2015-12-31 NOTE — Telephone Encounter (Signed)
Ms  Lindsey Hughes stated that the Prempro 0.45-1.5 mg  prescribed last week is not helping her hot flashes. Told her that [name] with Gyn ONC sent in another prescription to her pharmacy for a stronger dose of Prempro.0.625-2.5 mg. She can call Gyn Onc if she continues with Hot flashes with increased dose of Prempro. Ms [name] verbalized understanding.

## 2016-01-03 ENCOUNTER — Telehealth: Payer: Self-pay | Admitting: Oncology

## 2016-01-03 ENCOUNTER — Other Ambulatory Visit: Payer: BLUE CROSS/BLUE SHIELD

## 2016-01-03 NOTE — Telephone Encounter (Signed)
01/03/2016 Appointment rescheduled to 01/04/2016 per patient request.

## 2016-01-04 ENCOUNTER — Telehealth: Payer: Self-pay

## 2016-01-04 ENCOUNTER — Other Ambulatory Visit (HOSPITAL_BASED_OUTPATIENT_CLINIC_OR_DEPARTMENT_OTHER): Payer: BLUE CROSS/BLUE SHIELD

## 2016-01-04 DIAGNOSIS — I808 Phlebitis and thrombophlebitis of other sites: Secondary | ICD-10-CM

## 2016-01-04 DIAGNOSIS — I82621 Acute embolism and thrombosis of deep veins of right upper extremity: Secondary | ICD-10-CM

## 2016-01-04 LAB — PROTIME-INR
INR: 2.9 (ref 2.00–3.50)
Protime: 34.8 Seconds — ABNORMAL HIGH (ref 10.6–13.4)

## 2016-01-04 NOTE — Telephone Encounter (Signed)
-----   Message from Gordy Levan, MD sent at 01/04/2016 12:56 PM EDT ----- Regarding: RE: PT/INR Results Decrease coumadin to 7.5 mg daily = 1.5 of 5 mg tablet daily Be sure she knows no ibuprofen while on coumadin- that still on med sheet.  Can take lovenox off of med sheet also please.  thanks ----- Message ----- From: Baruch Merl, RN Sent: 01/04/2016  12:30 PM To: Gordy Levan, MD Subject: PT/INR Results                                 Ms Maclaren' INR today is 2.90 She has been on Coumadin 10 mg daily since 12-28-15. Repeat PT/INR on 01-10-16. Coumadin Dose?  Barbaraann Share

## 2016-01-04 NOTE — Telephone Encounter (Signed)
Told Lindsey Hughes to take 7.5 mg of coumadin daily with repeat PT/INR on 01-10-16 as scheduled. Reviewed that she is not to take Ibuprofen or naprosyn while on coumadin due to increased chance of bleeding. Lindsey Shetterly verbalized understanding.

## 2016-01-10 ENCOUNTER — Encounter: Payer: Self-pay | Admitting: Oncology

## 2016-01-10 ENCOUNTER — Telehealth: Payer: Self-pay | Admitting: *Deleted

## 2016-01-10 ENCOUNTER — Other Ambulatory Visit: Payer: BLUE CROSS/BLUE SHIELD

## 2016-01-10 NOTE — Telephone Encounter (Signed)
FYI "I have an appointment today for lab but I have to go to [facility].  Can the lab appointment be changed to Thursday when I see Dr. Sondra Come." Scheduling message sent requesting 10:00 am for 02-12-2016.

## 2016-01-12 ENCOUNTER — Encounter: Payer: Self-pay | Admitting: General Practice

## 2016-01-12 NOTE — Progress Notes (Signed)
[facility] Spiritual Care Note  Reached Ms Meckel by phone to provide Spiritual Care check-in and reminder of ongoing Support Team availability.  Per pt, everything is going well right now.  She is hopeful for good PET scan results next month. Pt also notes that dtr (stepdtr) is doing home dialysis while on kidney transplant list.  Ms Clarence is grateful that dtr does not need to go to hospital for this care and that dtr is going to school in the midst of it all.  Encouraged pt to reach out to Support Team any time, but please also page if needs arise.  Thank you.   [place], [place], [facility] Pager [phone] Voicemail [phone]

## 2016-01-13 ENCOUNTER — Telehealth: Payer: Self-pay

## 2016-01-13 ENCOUNTER — Ambulatory Visit
Admission: RE | Admit: 2016-01-13 | Discharge: 2016-01-13 | Disposition: A | Discharge status: Home / Self Care | Payer: BLUE CROSS/BLUE SHIELD | Source: Ambulatory Visit | Attending: Radiation Oncology | Admitting: Radiation Oncology

## 2016-01-13 ENCOUNTER — Encounter: Payer: Self-pay | Admitting: Radiation Oncology

## 2016-01-13 ENCOUNTER — Other Ambulatory Visit (HOSPITAL_BASED_OUTPATIENT_CLINIC_OR_DEPARTMENT_OTHER): Payer: BLUE CROSS/BLUE SHIELD

## 2016-01-13 DIAGNOSIS — I808 Phlebitis and thrombophlebitis of other sites: Secondary | ICD-10-CM

## 2016-01-13 DIAGNOSIS — I82621 Acute embolism and thrombosis of deep veins of right upper extremity: Secondary | ICD-10-CM | POA: Diagnosis not present

## 2016-01-13 DIAGNOSIS — Y842 Radiological procedure and radiotherapy as the cause of abnormal reaction of the patient, or of later complication, without mention of misadventure at the time of the procedure: Secondary | ICD-10-CM | POA: Insufficient documentation

## 2016-01-13 DIAGNOSIS — C539 Malignant neoplasm of cervix uteri, unspecified: Secondary | ICD-10-CM

## 2016-01-13 LAB — PROTIME-INR
INR: 2.1 (ref 2.00–3.50)
PROTIME: 25.2 s — AB (ref 10.6–13.4)

## 2016-01-13 NOTE — Progress Notes (Signed)
  Home Care Instructions for the Insertion and Care of Your Vaginal Dilator  Why Do I Need a Vaginal Dilator?  Internal radiation therapy may cause scar tissue to form at the top of your vagina (vaginal cuff).  This may make vaginal examinations difficult in the future. You can prevent scar tissue from forming by using a vaginal dilator (a smooth plastic rod), and/or by having regular sexual intercourse.  If not using the dilator you should be having intercourse two or three times a week.  If you are unable to have intercourse, you should use your vaginal dilator.  You may have some spotting or bleeding from your dilator or intercourse the first few times. You may also have some discomfort. If discomfort occurs with intercourse, you and your partner may need to stop for a while and try again later.  How to Use Your Vaginal Dilator  - Wash the dilator with soap and water before and after each use. - Check the dilator to be sure it is smooth. Do not use the dilator if you find any roughspots. - Coat the dilator with K-Y Jelly, Astroglide, or Replens. Do not use Vaseline, baby oil, or other oil based lubricants. They are not water-soluble and can be irritating to the tissues in the vagina. - Lie on your back with your knees bent and legs apart. - Insert the rounded end of the dilator into your vagina as far as it will go without causing pain or discomfort. - Close your knees and slowly straighten your legs. - Keep the dilator in your vagina for about 10 to 15 minutes. - Bend your knees, open your legs, and gently remove the dilator. - Gently cleanse the skin around the vaginal opening. - Wash the dilator after each use. -  It is important that you use the dilator routinely until instructed otherwise by your doctor.   

## 2016-01-13 NOTE — Progress Notes (Addendum)
[name] Underdown here for follow up after treatment to her pelvis.  She denies having pain, urinary issues, bowel issues, nausea, vaginal/rectal discharge or bleeding.  She reports her energy level is back to normal.  Patient was given a size S+ and M vaginal dilator and was instructed to use it 3 times a week for 10 minutes.  BP 116/80 (BP Location: Left Arm, Patient Position: Sitting)   Pulse 73   Temp 98.1 F (36.7 C) (Oral)   Ht 5\' 2"  (1.575 m)   Wt 208 lb 4.8 oz (94.5 kg)   SpO2 100%   BMI 38.10 kg/m    Wt Readings from Last 3 Encounters:  01/13/16 208 lb 4.8 oz (94.5 kg)  12/24/15 203 lb 1.6 oz (92.1 kg)  12/23/15 203 lb 3.2 oz (92.2 kg)

## 2016-01-13 NOTE — Telephone Encounter (Signed)
Per Dr Marko Plume: lvm to continue at 7.5 mg daily. Next INR 10/9.

## 2016-01-13 NOTE — Progress Notes (Signed)
Radiation Oncology         [phone] ________________________________  Name: Lindsey Hughes MRN: [id number]  Date: 01/13/2016  DOB: 12-20-1970  Follow-Up Visit Note  CC: Couillard, Frazier Butt, MD    ICD-9-CM ICD-10-CM   1. 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      Interval Since Last Radiation: 5 weeks  External Beam Radiation to the Pelvis: 10/04/15 - 11/19/15 HDR-Cervical: 11/09/15 - 12/07/15  Site/dose:   1) Pelvis: 45 Gy in 25 fractions.  2) Pelvic Boost: 9 Gy in 5 fractions.  3) Brachytherapy: 27.5 Gy in 5 fractions. Tandem/ring system.  Narrative:  The patient returns today for routine follow-up. She denies having pain, urinary issues, bowel issues, nausea, vaginal/rectal discharge or bleeding. The patient had her Port-A-Cath removed and reports swelling in that region. She reports her energy level is back to normal. The patient followed up with Dr. Marko Plume on 12/23/15 and Dr. Denman George on 12/24/15.  ALLERGIES:  is allergic to zofran [ondansetron hcl].  Meds: Current Outpatient Prescriptions  Medication Sig Dispense Refill  . estrogen, conjugated,-medroxyprogesterone (PREMPRO) 0.625-2.5 MG tablet Take 1 tablet by mouth daily. 30 tablet 6  . warfarin (COUMADIN) 5 MG tablet Take 2 tablet by mouth daily beginning 11-26-15 and as directed 30 tablet 1  . acetaminophen (TYLENOL) 500 MG tablet Take 500 mg by mouth every 6 (six) hours as needed. Reported on 11/02/2015    . pantoprazole (PROTONIX) 40 MG tablet Take 1 tablet (40 mg total) by mouth daily. (Patient not taking: Reported on 01/13/2016) 30 tablet 2  . potassium chloride SA (K-DUR,KLOR-CON) 20 MEQ tablet Take 1 tab PO BID x 1 day; then decrease to 1 tab PO QD till gone. (Patient not taking: Reported on 01/13/2016) 7 tablet 0  . triamcinolone (KENALOG) 0.1 % paste Apply to mouth ulers twice a day. (Patient not  taking: Reported on 01/13/2016) 5 g 1   No current facility-administered medications for this encounter.     Physical Findings: The patient is in no acute distress. Patient is alert and oriented.  height is 5\' 2"  (1.575 m) and weight is 208 lb 4.8 oz (94.5 kg). Her oral temperature is 98.1 F (36.7 C). Her blood pressure is 116/80 and her pulse is 73. Her oxygen saturation is 100%.   Lungs are clear to auscultation bilaterally. Heart has regular rate and rhythm. No palpable cervical, supraclavicular, or axillary adenopathy. Abdomen soft, non-tender, normal bowel sounds.  A pelvic exam was deferred in light of the recent completion of her treatment.  Lab Findings: Lab Results  Component Value Date   WBC 4.1 12/23/2015   HGB 11.3 (L) 12/23/2015   HCT 32.8 (L) 12/23/2015   MCV 93.2 12/23/2015   PLT 257 12/23/2015    Radiographic Findings: Ir Removal Tun Access W/ Port W/o Fl  Result Date: 12/20/2015 CLINICAL DATA:  History of cervical cancer, no longer in need of right anterior chest wall internal jugular approach port a catheter. The Port a catheter was placed by Dr. Anselm Pancoast on 10/11/2015. While the [facility] a catheter has functioned well throughout duration two-view Manuela Schwartz there is no sign of infection, the patient has experienced transient right upper extremity DVT though there is no documented history of internal jugular or subclavian vein thrombosis. EXAM: REMOVAL OF IMPLANTED TUNNELED PORT-A-CATH MEDICATIONS: Ancef 2 gm IV. The antibiotic was administered within 1  hour prior to the start of the procedure. ANESTHESIA/SEDATION: None per patient request FLUOROSCOPY TIME:  None PROCEDURE: Informed written consent was obtained from the patient after a discussion of the risk, benefits and alternatives to the procedure. The patient was positioned supine on the fluoroscopy table and the right chest Port-A-Cath site was prepped with chlorhexidine. A sterile gown and gloves were worn during the procedure.  Local anesthesia was provided with 1% lidocaine with epinephrine. A timeout was performed prior to the initiation of the procedure. An incision was made overlying the Port-A-Cath with a #15 scalpel. Utilizing sharp and blunt dissection, the Port-A-Cath was removed completely. The pocked was irrigated with sterile saline. Wound closure was performed with subcutaneous 2-0 Vicryl, subcuticular 4-0 Vicryl, Dermabond and Steri-Strips. A dressing was placed. The patient tolerated the procedure well without immediate post procedural complication. FINDINGS: Successful removal of implant Port-A-Cath without immediate post procedural complication. IMPRESSION: Successful removal of implanted Port-A-Cath. Electronically Signed   By: Sandi Mariscal M.D.   On: 12/20/2015 15:05    Impression:  The patient is recovering from the effects of radiation.  Plan: The patient was given a size S+ and M vaginal dilator and was instructed to use it 3 times a week for 10 minutes. The patient has a PET scan scheduled on 01/27/16. She is scheduled to follow up with Dr. Marko Plume on 01/31/16 and Dr. Denman George on 04/03/16. She will follow up with radiation oncology in March 2018.  ____________________________________ -----------------------------------  Blair Promise, PhD, MD  This document serves as a record of services personally performed by Gery Pray, MD. It was created on his behalf by Darcus Austin, a trained medical scribe. The creation of this record is based on the scribe's personal observations and the provider's statements to them. This document has been checked and approved by the attending provider.

## 2016-01-17 ENCOUNTER — Other Ambulatory Visit: Payer: Self-pay | Admitting: Oncology

## 2016-01-17 DIAGNOSIS — I808 Phlebitis and thrombophlebitis of other sites: Secondary | ICD-10-CM

## 2016-01-17 DIAGNOSIS — I82621 Acute embolism and thrombosis of deep veins of right upper extremity: Secondary | ICD-10-CM

## 2016-01-17 MED FILL — WARFARIN SODIUM 5 MG TABLET: 5 | 15 days supply | Qty: 30 | Fill #0

## 2016-01-27 ENCOUNTER — Telehealth: Payer: Self-pay | Admitting: Gynecologic Oncology

## 2016-01-27 ENCOUNTER — Encounter (HOSPITAL_COMMUNITY)
Admission: RE | Admit: 2016-01-27 | Discharge: 2016-01-27 | Disposition: A | Discharge status: Home / Self Care | Payer: BLUE CROSS/BLUE SHIELD | Source: Ambulatory Visit | Attending: Gynecologic Oncology | Admitting: Gynecologic Oncology

## 2016-01-27 DIAGNOSIS — C531 Malignant neoplasm of exocervix: Secondary | ICD-10-CM | POA: Diagnosis present

## 2016-01-27 LAB — GLUCOSE, CAPILLARY: Glucose-Capillary: 98 mg/dL (ref 65–99)

## 2016-01-27 MED ORDER — FLUDEOXYGLUCOSE F - 18 (FDG) INJECTION
10.1000 | Freq: Once | INTRAVENOUS | Status: AC | PRN
  Administered 2016-01-27: 10.1 via INTRAVENOUS

## 2016-01-27 NOTE — Telephone Encounter (Signed)
Patient informed of PET scan results.  No concerns voiced.  Complete response based on PET per Dr. Denman George.  Advised to call for any new symptoms, questions, or concerns.

## 2016-01-30 ENCOUNTER — Other Ambulatory Visit: Payer: Self-pay | Admitting: Oncology

## 2016-01-30 DIAGNOSIS — C539 Malignant neoplasm of cervix uteri, unspecified: Secondary | ICD-10-CM

## 2016-01-30 DIAGNOSIS — I82621 Acute embolism and thrombosis of deep veins of right upper extremity: Secondary | ICD-10-CM

## 2016-01-30 DIAGNOSIS — C531 Malignant neoplasm of exocervix: Secondary | ICD-10-CM

## 2016-01-31 ENCOUNTER — Encounter: Payer: Self-pay | Admitting: Oncology

## 2016-01-31 ENCOUNTER — Ambulatory Visit: Payer: BLUE CROSS/BLUE SHIELD | Admitting: Oncology

## 2016-01-31 ENCOUNTER — Other Ambulatory Visit: Payer: BLUE CROSS/BLUE SHIELD

## 2016-01-31 NOTE — Progress Notes (Signed)
MEDICAL ONCOLOGY  Patient [facility] MD + lab 01-31-16  Message to scheduling: Please reschedule lab this week + flu vaccine Please reschedule lab + MD ~ Nov 6 or 9  L.Marko Plume, MD

## 2016-02-01 ENCOUNTER — Telehealth: Payer: Self-pay | Admitting: Oncology

## 2016-02-01 ENCOUNTER — Telehealth: Payer: Self-pay

## 2016-02-01 NOTE — Telephone Encounter (Signed)
Spoke with Lindsey Hughes and verified appointment dates and time for 02-04-16 for lab,Coumadin dosing, and flu shot. Appointment  02-28-16 for lab and F/U visit with Dr. Marko Plume.  Lindsey Bauers verbalized understanding.

## 2016-02-01 NOTE — Telephone Encounter (Signed)
lvm to inform pt of r/s lab/ov appt for 11/6 and added lab and flu shot 10/13 per LOS

## 2016-02-01 NOTE — Telephone Encounter (Signed)
-----   Message from Gordy Levan, MD sent at 01/31/2016  5:16 PM EDT ----- Sherald Hess MD + lab (CBC CMET INR) on 01-31-16  Message sent to schedulers to get lab + flu shot this week (flu shot if she will, important in part due to young child on dialysis/ transplant list) and to see me with lab Niv, WIll need to let her know coumadin dose   thanks

## 2016-02-03 ENCOUNTER — Telehealth: Payer: Self-pay

## 2016-02-03 NOTE — Telephone Encounter (Signed)
Pt called asking if she can see Dr Marko Plume tomorrow when she comes for her labs. Her tongue has turned brown in color. She is brushing it without change. It is not tender. It has been for about 1.5 weeks. She denies any urinary or bowel problems, denies sore throat.   S/w Dr Marko Plume and called pt. The RN will look at her tongue tomorrow and give her the flu shot.

## 2016-02-04 ENCOUNTER — Telehealth: Payer: Self-pay

## 2016-02-04 ENCOUNTER — Ambulatory Visit (HOSPITAL_BASED_OUTPATIENT_CLINIC_OR_DEPARTMENT_OTHER): Payer: BLUE CROSS/BLUE SHIELD

## 2016-02-04 ENCOUNTER — Other Ambulatory Visit (HOSPITAL_BASED_OUTPATIENT_CLINIC_OR_DEPARTMENT_OTHER): Payer: BLUE CROSS/BLUE SHIELD

## 2016-02-04 DIAGNOSIS — I82621 Acute embolism and thrombosis of deep veins of right upper extremity: Secondary | ICD-10-CM

## 2016-02-04 DIAGNOSIS — Z23 Encounter for immunization: Secondary | ICD-10-CM | POA: Diagnosis not present

## 2016-02-04 DIAGNOSIS — C539 Malignant neoplasm of cervix uteri, unspecified: Secondary | ICD-10-CM | POA: Diagnosis not present

## 2016-02-04 DIAGNOSIS — B379 Candidiasis, unspecified: Secondary | ICD-10-CM

## 2016-02-04 LAB — PROTIME-INR
INR: 1.9 — ABNORMAL LOW (ref 2.00–3.50)
Protime: 22.8 Seconds — ABNORMAL HIGH (ref 10.6–13.4)

## 2016-02-04 LAB — CBC WITH DIFFERENTIAL/PLATELET
BASO%: 0.5 % (ref 0.0–2.0)
Basophils Absolute: 0 10*3/uL (ref 0.0–0.1)
EOS%: 1.1 % (ref 0.0–7.0)
Eosinophils Absolute: 0.1 10*3/uL (ref 0.0–0.5)
HCT: 36.1 % (ref 34.8–46.6)
HGB: 12.1 g/dL (ref 11.6–15.9)
LYMPH%: 9 % — AB (ref 14.0–49.7)
MCH: 33.2 pg (ref 25.1–34.0)
MCHC: 33.5 g/dL (ref 31.5–36.0)
MCV: 99.2 fL (ref 79.5–101.0)
MONO#: 0.4 10*3/uL (ref 0.1–0.9)
MONO%: 8.3 % (ref 0.0–14.0)
NEUT%: 81.1 % — ABNORMAL HIGH (ref 38.4–76.8)
NEUTROS ABS: 4 10*3/uL (ref 1.5–6.5)
Platelets: 322 10*3/uL (ref 145–400)
RBC: 3.64 10*6/uL — AB (ref 3.70–5.45)
RDW: 12 % (ref 11.2–14.5)
WBC: 4.9 10*3/uL (ref 3.9–10.3)
lymph#: 0.4 10*3/uL — ABNORMAL LOW (ref 0.9–3.3)

## 2016-02-04 LAB — COMPREHENSIVE METABOLIC PANEL
ALT: 11 U/L (ref 0–55)
AST: 13 U/L (ref 5–34)
Albumin: 3.3 g/dL — ABNORMAL LOW (ref 3.5–5.0)
Alkaline Phosphatase: 80 U/L (ref 40–150)
Anion Gap: 10 mEq/L (ref 3–11)
BILIRUBIN TOTAL: 0.23 mg/dL (ref 0.20–1.20)
BUN: 18.5 mg/dL (ref 7.0–26.0)
CO2: 27 meq/L (ref 22–29)
Calcium: 9.6 mg/dL (ref 8.4–10.4)
Chloride: 104 mEq/L (ref 98–109)
Creatinine: 1 mg/dL (ref 0.6–1.1)
EGFR: 71 mL/min/{1.73_m2} — AB (ref >90)
GLUCOSE: 95 mg/dL (ref 70–140)
Potassium: 3.7 mEq/L (ref 3.5–5.1)
SODIUM: 141 meq/L (ref 136–145)
TOTAL PROTEIN: 7.3 g/dL (ref 6.4–8.3)

## 2016-02-04 MED ORDER — FLUCONAZOLE 100 MG PO TABS: 100.0000 mg | ORAL_TABLET | Freq: Every day | ORAL | 0 refills | Status: DC

## 2016-02-04 MED ORDER — INFLUENZA VAC SPLIT QUAD 0.5 ML IM SUSY
0.5000 mL | PREFILLED_SYRINGE | Freq: Once | INTRAMUSCULAR | Status: AC
  Administered 2016-02-04: 0.5 mL via INTRAMUSCULAR
  Filled 2016-02-04: qty 0.5

## 2016-02-04 NOTE — Progress Notes (Signed)
Pt had 2 questions: First her tongue is brown, it is not tender, it has a bad taste and odor to it. This RN looked and it does look like thrush, tan coating on posterior tongue.   2nd she is asking if now is a good time to get her hysterectomy and bladder sling. Would she go back to Dr Pamala Hurry who was originally going to do surgery, or who would do surgery.   She discussed pain with intercourse. Denied dryness but pain with penetration.  Instructed her to use her dilator given to her by rad onc. She refused seeing Melissa NP at present. Instructed her to call if intercourse becomes more painful, or restricted.

## 2016-02-04 NOTE — Telephone Encounter (Signed)
Called pt with warfarin adjustment. Take 10 mg today Mondays and Fridays. Take 7.5 mg other days. Pt spoke back directions. PT STATED SHE HAD MISSED 2-3 DOSES IN THE LAST WEEK. Told her we will call her on Monday with adjustment of orders.   Ordered diflucan for 3 days per Dr Marko Plume. Pt to use biotene mouthwash daily. Discussed baking soda rinses if she cannot afford biotene.   Told pt to ask Dr Denman George at her December appt about proceeding with hysterectomy and bladder sling surgery.

## 2016-02-07 MED ORDER — WARFARIN SODIUM 5 MG PO TABS: ORAL_TABLET | ORAL | 1 refills | Status: DC

## 2016-02-07 MED ORDER — FLUCONAZOLE 100 MG PO TABS: 100.0000 mg | ORAL_TABLET | Freq: Every day | ORAL | 0 refills | Status: DC

## 2016-02-07 NOTE — Telephone Encounter (Signed)
Told Lindsey Hughes that Dr. Marko Plume said to take 7.5 mg daily and try not to miss doses. Refills sent to pharmacy for coumadin and resent diflucan to [facility] in [place] not [place].

## 2016-02-07 NOTE — Addendum Note (Signed)
Addended by: Baruch Merl on: 02/07/2016 10:16 AM   Modules accepted: Orders

## 2016-02-15 ENCOUNTER — Telehealth: Payer: Self-pay | Admitting: *Deleted

## 2016-02-15 NOTE — Telephone Encounter (Signed)
Pt states she took medicine for 3 days for tongue problems. States she is still having the same issues, no change after taking diflucan. Wants to know what she can do.

## 2016-02-15 NOTE — Telephone Encounter (Signed)
-----   Message from Gordy Levan, MD sent at 02/15/2016 12:25 PM EDT ----- Phone note seen: Pt states she took medicine for 3 days for tongue problems. States she is still having the same issues, no change after taking diflucan. Wants to know what she can do.  I would do salt water mouth washes 3x daily, also fine to see PCP or dentist. She has apt with me on 11-6  Thank you

## 2016-02-15 NOTE — Telephone Encounter (Signed)
Notified of Dr Mariana Kaufman response. Verbalized understanding

## 2016-02-26 ENCOUNTER — Other Ambulatory Visit: Payer: Self-pay | Admitting: Oncology

## 2016-02-26 DIAGNOSIS — C531 Malignant neoplasm of exocervix: Secondary | ICD-10-CM

## 2016-02-26 DIAGNOSIS — I82621 Acute embolism and thrombosis of deep veins of right upper extremity: Secondary | ICD-10-CM

## 2016-02-28 ENCOUNTER — Other Ambulatory Visit (HOSPITAL_BASED_OUTPATIENT_CLINIC_OR_DEPARTMENT_OTHER): Payer: BLUE CROSS/BLUE SHIELD

## 2016-02-28 ENCOUNTER — Ambulatory Visit (HOSPITAL_BASED_OUTPATIENT_CLINIC_OR_DEPARTMENT_OTHER): Payer: BLUE CROSS/BLUE SHIELD | Admitting: Oncology

## 2016-02-28 VITALS — BP 119/73 | HR 67 | Temp 98.1°F | Resp 18 | Ht 62.0 in | Wt 208.0 lb

## 2016-02-28 DIAGNOSIS — C531 Malignant neoplasm of exocervix: Secondary | ICD-10-CM

## 2016-02-28 DIAGNOSIS — I82721 Chronic embolism and thrombosis of deep veins of right upper extremity: Secondary | ICD-10-CM | POA: Diagnosis not present

## 2016-02-28 DIAGNOSIS — C539 Malignant neoplasm of cervix uteri, unspecified: Secondary | ICD-10-CM

## 2016-02-28 DIAGNOSIS — E039 Hypothyroidism, unspecified: Secondary | ICD-10-CM | POA: Diagnosis not present

## 2016-02-28 DIAGNOSIS — Z7901 Long term (current) use of anticoagulants: Secondary | ICD-10-CM | POA: Diagnosis not present

## 2016-02-28 DIAGNOSIS — I82621 Acute embolism and thrombosis of deep veins of right upper extremity: Secondary | ICD-10-CM

## 2016-02-28 LAB — CBC WITH DIFFERENTIAL/PLATELET
BASO%: 0.4 % (ref 0.0–2.0)
Basophils Absolute: 0 10*3/uL (ref 0.0–0.1)
EOS ABS: 0.1 10*3/uL (ref 0.0–0.5)
EOS%: 2.4 % (ref 0.0–7.0)
HEMATOCRIT: 37.3 % (ref 34.8–46.6)
HGB: 12.6 g/dL (ref 11.6–15.9)
LYMPH%: 13.8 % — AB (ref 14.0–49.7)
MCH: 32.2 pg (ref 25.1–34.0)
MCHC: 33.8 g/dL (ref 31.5–36.0)
MCV: 95.4 fL (ref 79.5–101.0)
MONO#: 0.5 10*3/uL (ref 0.1–0.9)
MONO%: 10.4 % (ref 0.0–14.0)
NEUT#: 3.6 10*3/uL (ref 1.5–6.5)
NEUT%: 73 % (ref 38.4–76.8)
PLATELETS: 268 10*3/uL (ref 145–400)
RBC: 3.91 10*6/uL (ref 3.70–5.45)
RDW: 11.5 % (ref 11.2–14.5)
WBC: 4.9 10*3/uL (ref 3.9–10.3)
lymph#: 0.7 10*3/uL — ABNORMAL LOW (ref 0.9–3.3)
nRBC: 0 % (ref 0–0)

## 2016-02-28 LAB — PROTIME-INR
INR: 2.8 (ref 2.00–3.50)
Protime: 33.6 Seconds — ABNORMAL HIGH (ref 10.6–13.4)

## 2016-02-28 NOTE — Progress Notes (Signed)
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   She completed 6 cycles of weekly sensitizing CDDP on 11-15-15, given with pelvic radiation. She completed HDR on 12-07-15. She saw Dr Denman George on 12-24-15 and Dr Sondra Come on 01-13-16. She had PET 01-27-16, this compared with 09-17-15 has resolution of previously hypermetabolic iliac nodes and decreased uptake in cervix from previous SUV 42 to 4, felt to be no active disease. She will see Dr Denman George again in Dec 2017 and Dr Sondra Come 06-2016.    Patient had superficial thrombus RUE basilic vein 0000000 after difficult peripheral IV access for chemo in RUE, then acute DVT right axillary, brachial and basilic veins [id number] with right sided PAC in place. She was kept on lovenox for removal of PAC on 12-20-15 and has been on coumadin since then, planned for 4-6 months. She states she has been compliant with coumadin 7.5 mg daily since 02-07-16, did have antibiotics mid Oct for respiratory infection. She had oral thrush likely related to antibiotics, improved in last 24 hours since additional diflucan by PCP. She has had no bleeding, tho bruises easily. She has slight pulling or tightness at antecubital area on right at times when she extends elbow, no swelling, cords, erythema. She denies SOB or chest pain. She has had no menses since start of chemoradiation, does have some hot flashes despite now on prempro 0.625-2.5mg  by gyn oncology. Appetite is good, bowels ok, no LE swelling, no respiratory symptoms now, no fever. No significant residual peripheral neuropathy. Remainder of 10 point Review of Systems negative.  No central catheter Flu vaccine 02-04-16  She saw PCP last week,  is following up on thyroid.  46 yo stepdaughter now on home dialysis 9 hours nightly. Still multiple trips to [facility], awaiting transplant, but otherwise doing well.   ONCOLOGIC HISTORY Patient has history of abnormal PAPs ~ 5-7 years ago, not followed up. She presented to PCP with menorrhagia x 3-4 months, which progressed to continuous bleeding, Evaluation by Dr Pamala Hurry was complicated by cervical stenosis, and bleeding did not improve with hormonal intervention. [place] 07-15-15 showed normal right ovary, probably normal left ovary and uterine fibroid. Endometrial biopsy was accomplished on 08-12-15, pathology "weakly proliferative endometrium with glandular and stromal breakdown and abundant blood, no endometrial hyperplasia identified". She was taken for planned robotic hysterectomy by Dr Pamala Hurry on 09-03-15, however exam under anesthesia revealed abnormalities in posterior lip of cervix and hysterectomy not done. Biopsy [id number] invasive poorly differentiated squamous cell carcinoma with involvement of endocervix and focal vascular invasion. 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 She was seen in consultation by Dr Denman George on 09-16-15, with posterior lip of cervix replaced by 6 cm tumor, with tumor beginning to infiltrate into bilateral parametria. Recommendation is for radiation with sensitizing chemotherapy. PET 09-17-15 showed intensely hypermetabolic cervical mass 5.2 x 3.3 cm (SUV 42), mild hypermetabolic uptake thru endometrium (SUV 6.2) and in high left adnexa without CT correlate, bilateral external iliac nodes 0.8 - 1.1 cm with low level uptake.Recommendation is for sensitizing chemotherapy with radiation. Weekly CDDP given x 6 from 10-04-15 thru 11-15-15. Last HDR 12-07-15.    Objective:  Vital signs in last 24 hours:  BP 119/73 (BP Location: Right Arm, Patient Position: Sitting)  Pulse 67   Temp 98.1 F (36.7 C) (Oral)   Resp 18   Ht 5\' 2"  (1.575 m)   Wt 208 lb (94.3 kg)   SpO2 100%    BMI 38.04 kg/m  Weight up 5 lbs. Looks comfortable Alert, oriented and appropriate. Ambulatory without difficulty.  No alopecia  HEENT:PERRL, sclerae not icteric. Oral mucosa moist without thrush visualized, tiny mucosal tear inner lower lip,  posterior pharynx clear.  Neck supple. No JVD.  Lymphatics:no cervical,supraclavicular or inguinal adenopathy Resp: clear to auscultation bilaterally and normal percussion bilaterally Cardio: regular rate and rhythm. No gallop. GI: soft, nontender, not distended, no mass or organomegaly. Normally active bowel sounds.  Musculoskeletal/ Extremities: RUE without swelling, cords, tenderness and full ROM . LE without pitting edema, cords, tenderness Neuro: no significant residual peripheral neuropathy. Otherwise nonfocal Skin without rash, ecchymosis, petechiae. Scar from previous PAC healed.   Lab Results:  Results for orders placed or performed in visit on 02/28/16  CBC with Differential  Result Value Ref Range   WBC 4.9 3.9 - 10.3 10e3/uL   NEUT# 3.6 1.5 - 6.5 10e3/uL   HGB 12.6 11.6 - 15.9 g/dL   HCT 37.3 34.8 - 46.6 %   Platelets 268 145 - 400 10e3/uL   MCV 95.4 79.5 - 101.0 fL   MCH 32.2 25.1 - 34.0 pg   MCHC 33.8 31.5 - 36.0 g/dL   RBC 3.91 3.70 - 5.45 10e6/uL   RDW 11.5 11.2 - 14.5 %   lymph# 0.7 (L) 0.9 - 3.3 10e3/uL   MONO# 0.5 0.1 - 0.9 10e3/uL   Eosinophils Absolute 0.1 0.0 - 0.5 10e3/uL   Basophils Absolute 0.0 0.0 - 0.1 10e3/uL   NEUT% 73.0 38.4 - 76.8 %   LYMPH% 13.8 (L) 14.0 - 49.7 %   MONO% 10.4 0.0 - 14.0 %   EOS% 2.4 0.0 - 7.0 %   BASO% 0.4 0.0 - 2.0 %   nRBC 0 0 - 0 %  Protime-INR  Result Value Ref Range   Protime 33.6 (H) 10.6 - 13.4 Seconds   INR 2.80 2.00 - 3.50   Lovenox No      Studies/Results: EXAM: NUCLEAR MEDICINE PET SKULL BASE TO THIGH  TECHNIQUE: 10.1 mCi F-18 FDG was injected intravenously. Full-ring PET imaging was performed from the skull base to thigh after the radiotracer. CT data was  obtained and used for attenuation correction and anatomic localization.  FASTING BLOOD GLUCOSE:  Value: 98 mg/dl  COMPARISON:  PET-CT scan 09/17/2015  FINDINGS: NECK  He her bony her prior 50  CHEST  No hypermetabolic mediastinal or hilar nodes. No suspicious pulmonary nodules on the CT scan.  ABDOMEN/PELVIS  Within the pelvis, there is marked interval reduction of hypermetabolic activity associated with the cervix with SUV max equal 4 decreased from SUV max equal 42.  Interval resolution of hypermetaboliciliac lymph nodes seen on comparison exam. No hypermetabolic iliac nodes remain.  No hypermetabolic periaortic lymph nodes are present. No abnormal metabolic activity liver.  SKELETON  No focal hypermetabolic activity to suggest skeletal metastasis.  IMPRESSION: 1. Marked reduction in metabolic activity associated with the cervix. 2. No hypermetabolic pelvic lymph nodes remain. 3. No evidence distant metastasis.  PACs images reviewed by MD   Medications: I have reviewed the patient's current medications. She is given written and oral instructions to take 5 mg coumadin today then continue 7.5 mg daily.  DISCUSSION As PAC is out, and as no active cancer apparent on the PET,  should be OK to complete anticoagulation ~ mid Dec, which will be ~ 4.5 months.  Will repeat INR in ~ 2 weeks and again when she sees Dr Denman George 12-11. As long as she is doing well, expect to DC coumadin as above.  Patient will follow with gyn oncology and radiation oncology, with prn back to med onc after anticoagulation is completed.   Discussed probable ovarian dysfunction related to radiation and chemo in this 45 yo lady. She will discuss with Drs Sondra Come and Denman George to see if they expect ovarian function to recover.   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 Weekly sensitizing CDDP with  radiation given x 6 cycles from 10-04-15 thru 11-15-15 and HDR thru 12-07-15.Repeat PET 01-27-16 resolution of uptake in iliac nodes and minimal uptake in cervix. She will see Dr Denman George in Dec and Dr Sondra Come in March .  2.RUE DVT 11-25-15 with superficial thrombus: on side of complication from peripheral IV used for chemo earlier and portacath. PAC removed. Plan to stop anticoagulation ~ min - late Dec if stable, which will be >=4.5 months on anticoagulation. Note she is on prempro now by gyn onc. 3.hypothyroid: medication as instructed by PCP: PET shows some uptake in left lobe thyroid without discreeet nodule on CT imaging. May need thyroid US at least after completion of this treatment if not already done by PCP (per patient's history, probably has not had thyroid US) 4.social stress with 66 yo step daughter recently diagnosed with renal failure, apparently congenital renal problems not previously known. On wait list for renal transplant. In and out of [facility]. Now on home dialysis x 9 hrs daily 5.multiple bowel movements daily as baseline, unchanged 6.Vasomotor symptoms with treatment induced menopause. Symptoms much better on prempro now by gyn oncology. See above re anticoagulation. 7..post bilateral carpal tunnel surgery and BTL 8.flu vaccine 02-04-16 9.oral thrush with recent antibiotics: exam unremarkable now, one additional dose diflucan already prescribed.  All questions answered. Time spent 25 min including >50% counseling and coordination of care. Route PCP, cc Drs Denman George and Sondra Come. Message to collaborative RN for INR   Evlyn Clines, MD   02/28/2016, 10:23 AM

## 2016-03-01 ENCOUNTER — Encounter: Payer: Self-pay | Admitting: Oncology

## 2016-03-08 MED ORDER — VENLAFAXINE HCL ER 37.5 MG PO CP24: 37.5000 mg | ORAL_CAPSULE | Freq: Every day | ORAL | 0 refills | Status: DC

## 2016-03-08 NOTE — Telephone Encounter (Signed)
Lindsey Hughes called today stating that the increased dose of Prempro of 0.625-2.5 mg is not helping with her hot flashes.  Is there another medication that is stronger/better to control the hot flashes?

## 2016-03-08 NOTE — Telephone Encounter (Signed)
Told Lindsey Hughes that Dr. Denman George recommended adding Effexor  37.5 mg XL to current Prempro dose. If she want to increase dosing of estrogen and progestrone, she will need to be prescribed a dose for estrogen and progesterone. It would be two separate pills.  Lindsey Lares will try adding the Effexor. Prescrliption sent in to her pharmacy for a month supply.

## 2016-03-08 NOTE — Addendum Note (Signed)
Addended by: Baruch Merl on: 03/08/2016 05:58 PM   Modules accepted: Orders

## 2016-03-13 ENCOUNTER — Telehealth: Payer: Self-pay

## 2016-03-13 ENCOUNTER — Other Ambulatory Visit (HOSPITAL_BASED_OUTPATIENT_CLINIC_OR_DEPARTMENT_OTHER): Payer: BLUE CROSS/BLUE SHIELD

## 2016-03-13 DIAGNOSIS — I808 Phlebitis and thrombophlebitis of other sites: Secondary | ICD-10-CM

## 2016-03-13 DIAGNOSIS — I82621 Acute embolism and thrombosis of deep veins of right upper extremity: Secondary | ICD-10-CM | POA: Diagnosis not present

## 2016-03-13 LAB — PROTIME-INR
INR: 1.6 — ABNORMAL LOW (ref 2.00–3.50)
Protime: 19.2 Seconds — ABNORMAL HIGH (ref 10.6–13.4)

## 2016-03-13 NOTE — Telephone Encounter (Signed)
Told Ms Guedea that Dr. Marko Plume said to take 10 mg of coumadin this evening then 7.5 mg daily. Repeat PT/INR on 04-03-16 as scheduled. Emphasized taking medication daily between 4-6 pm. Ms. Hofler verbalized understanding.

## 2016-03-13 NOTE — Telephone Encounter (Signed)
ENCOUNTER OPENED IN ERROR

## 2016-04-03 ENCOUNTER — Encounter: Payer: Self-pay | Admitting: Gynecologic Oncology

## 2016-04-03 ENCOUNTER — Ambulatory Visit: Payer: BLUE CROSS/BLUE SHIELD | Attending: Gynecologic Oncology | Admitting: Gynecologic Oncology

## 2016-04-03 ENCOUNTER — Telehealth: Payer: Self-pay

## 2016-04-03 ENCOUNTER — Other Ambulatory Visit (HOSPITAL_BASED_OUTPATIENT_CLINIC_OR_DEPARTMENT_OTHER): Payer: BLUE CROSS/BLUE SHIELD

## 2016-04-03 VITALS — BP 110/68 | HR 74 | Temp 98.1°F | Resp 18 | Ht 62.0 in | Wt 209.4 lb

## 2016-04-03 DIAGNOSIS — Z808 Family history of malignant neoplasm of other organs or systems: Secondary | ICD-10-CM | POA: Insufficient documentation

## 2016-04-03 DIAGNOSIS — I808 Phlebitis and thrombophlebitis of other sites: Secondary | ICD-10-CM

## 2016-04-03 DIAGNOSIS — E28319 Asymptomatic premature menopause: Secondary | ICD-10-CM | POA: Diagnosis not present

## 2016-04-03 DIAGNOSIS — E8941 Symptomatic postprocedural ovarian failure: Secondary | ICD-10-CM

## 2016-04-03 DIAGNOSIS — Z801 Family history of malignant neoplasm of trachea, bronchus and lung: Secondary | ICD-10-CM | POA: Insufficient documentation

## 2016-04-03 DIAGNOSIS — Z8541 Personal history of malignant neoplasm of cervix uteri: Secondary | ICD-10-CM

## 2016-04-03 DIAGNOSIS — Z923 Personal history of irradiation: Secondary | ICD-10-CM | POA: Diagnosis not present

## 2016-04-03 DIAGNOSIS — Z888 Allergy status to other drugs, medicaments and biological substances status: Secondary | ICD-10-CM | POA: Insufficient documentation

## 2016-04-03 DIAGNOSIS — Z9221 Personal history of antineoplastic chemotherapy: Secondary | ICD-10-CM | POA: Diagnosis not present

## 2016-04-03 DIAGNOSIS — C539 Malignant neoplasm of cervix uteri, unspecified: Secondary | ICD-10-CM | POA: Insufficient documentation

## 2016-04-03 DIAGNOSIS — Z7901 Long term (current) use of anticoagulants: Secondary | ICD-10-CM | POA: Insufficient documentation

## 2016-04-03 DIAGNOSIS — F329 Major depressive disorder, single episode, unspecified: Secondary | ICD-10-CM | POA: Diagnosis not present

## 2016-04-03 DIAGNOSIS — I82621 Acute embolism and thrombosis of deep veins of right upper extremity: Secondary | ICD-10-CM

## 2016-04-03 DIAGNOSIS — Z86718 Personal history of other venous thrombosis and embolism: Secondary | ICD-10-CM | POA: Insufficient documentation

## 2016-04-03 DIAGNOSIS — E039 Hypothyroidism, unspecified: Secondary | ICD-10-CM | POA: Insufficient documentation

## 2016-04-03 DIAGNOSIS — Z9851 Tubal ligation status: Secondary | ICD-10-CM | POA: Diagnosis not present

## 2016-04-03 DIAGNOSIS — Z8249 Family history of ischemic heart disease and other diseases of the circulatory system: Secondary | ICD-10-CM | POA: Diagnosis not present

## 2016-04-03 DIAGNOSIS — K219 Gastro-esophageal reflux disease without esophagitis: Secondary | ICD-10-CM | POA: Insufficient documentation

## 2016-04-03 LAB — PROTIME-INR
INR: 2.2 (ref 2.00–3.50)
PROTIME: 26.4 s — AB (ref 10.6–13.4)

## 2016-04-03 NOTE — Telephone Encounter (Signed)
Spoke with husband and bave him the coumadin dosing of &.5 mg daily.  Dr Marko Plume said that Ms Trim can stop the coumadin after 04-23-16. Husband wrote down directions and verbalized understanding.

## 2016-04-03 NOTE — Progress Notes (Signed)
Consult Note: Gyn-Onc  Consult was requested by Dr. Pamala Hurry for the evaluation of Lindsey Hughes 45 y.o. female  CC:  No chief complaint on file.   Assessment/Plan:  Lindsey Hughes  is a 45 y.o.  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  Complete clinical response including on post-treatment restaging PET in October, 2017.  Dr Sondra Come will see her for follow-up in 3 months I will see her for follow-up in 3 months.  Continue Prempro for her to take for radiation induced premature menopause.  HPI: Lindsey Hughes is a very pleasant 45 year old who is seen in consultation at the request of Dr Pamala Hurry for cervical cancer.  The patient reports a 4 month history of peristent daily vaginal bleeding. She has also had back pain and more recently random urinary incontinence.  She cannot remember when she last had a pap smear (5-10 years ago) but she does recall having a history of abnormal paps in the past, though was not treated for these and feels that she "was never told they needed treatment".   As workup for her bleeding she saw Dr Pamala Hurry. An office endometrial biopsy was performed on 08/12/15 and revealed benign, weakly proliferative endoemetrium. During that office exam the posterior lip of the cervix was not visualized due to positioning of the uterus and body habitus. Passage of the pipelle was difficult due to obstruction at the endocervix. Pap was taken but was not resulted by the time of her scheduled hysterectomy. It later returned as ASC-H, positive for high risk HPV.  The patient was scheduled for a robotic hysterectomy and on 09/03/15 was taken to the OR. Intraoperative findings were significant for a very abnormal posterior lip of cervix which was nodular and friable. The cervix could be visualized intra-op under general anesthetic. Because it was clearly abnormal she underwent biopsy by  Dr Pamala Hurry which returned positive for poorly differentiated squamous cell carcinoma. The procedure was aborted and a referral was made to [place].  PET scan on 09/17/15 showed hypermetabolism of a 5.2cm cervical mass and bilateral external iliac nodes with increased FDG uptake suggestive of metastatic disease. Between 10/04/15 to 11/19/15 she received external beam radiation (45 gy in 25 fractions) with radiosensitizing CDDP. Between 11/09/15- 12/07/15 she received intracavitary brachytherapy (HDR) with 27.5Gy given in 5 fractions. She additionally received a pelvic boost of 9 Gy. She tolerated treatment well with no major GI or GU toxicity.  She did develop a Rt UE DVT for which her port was removed and she was started on lovenox and coumadin.  She did develop symptoms of menopause with hot flashes.  Interval Hx: Post treatment re-staging PET scan was performed on 01/27/16 which showed complete clinical response in the previously enlarged and avid pelvic nodes. No residual cervical mass and residual cervical FDG avidity of 4 demonstrating complete clinical response. No new lesions.   She is doing well. She has no bleeding. She is sexually active. She denies LE edema. She denies diarrhea but does have some "loose stools".  Current Meds:  Outpatient Encounter Prescriptions as of 04/03/2016  Medication Sig  . acetaminophen (TYLENOL) 500 MG tablet Take 500 mg by mouth every 6 (six) hours as needed. Reported on 11/02/2015  . estrogen, conjugated,-medroxyprogesterone (PREMPRO) 0.625-2.5 MG tablet Take 1 tablet by mouth daily.  Marland Kitchen levothyroxine (SYNTHROID, LEVOTHROID) 25 MCG tablet   . rosuvastatin (CRESTOR) 5 MG tablet Take 5 mg  by mouth.  . traZODone (DESYREL) 50 MG tablet Take 50 mg by mouth.  . venlafaxine XR (EFFEXOR-XR) 37.5 MG 24 hr capsule Take 1 capsule (37.5 mg total) by mouth daily with breakfast. For hot flashes  . warfarin (COUMADIN) 5 MG tablet Take 7.5 mg daily or as directed  .  [DISCONTINUED] levothyroxine (SYNTHROID, LEVOTHROID) 25 MCG tablet Take by mouth.  . [DISCONTINUED] fluconazole (DIFLUCAN) 150 MG tablet Take 150 mg by mouth.  . [DISCONTINUED] pantoprazole (PROTONIX) 40 MG tablet Take 1 tablet (40 mg total) by mouth daily. (Patient not taking: Reported on 04/03/2016)  . [DISCONTINUED] potassium chloride SA (K-DUR,KLOR-CON) 20 MEQ tablet Take 1 tab PO BID x 1 day; then decrease to 1 tab PO QD till gone. (Patient not taking: Reported on 04/03/2016)  . [DISCONTINUED] triamcinolone (KENALOG) 0.1 % paste Apply to mouth ulers twice a day. (Patient not taking: Reported on 04/03/2016)   No facility-administered encounter medications on file as of 04/03/2016.     Allergy:  Allergies  Allergen Reactions  . Zofran [Ondansetron Hcl] Other (See Comments)    headache    Social Hx:   Social History   Social History  . Marital status: Married    Spouse name: N/A  . Number of children: 1  . Years of education: N/A   Occupational History  . Not on file.   Social History Main Topics  . Smoking status: Never Smoker  . Smokeless tobacco: Never Used  . Alcohol use No  . Drug use: No  . Sexual activity: Yes    Birth control/ protection: Surgical   Other Topics Concern  . Not on file   Social History Narrative  . No narrative on file    Past Surgical Hx:  Past Surgical History:  Procedure Laterality Date  . CARPAL TUNNEL RELEASE Bilateral 2002 and 2009  . CESAREAN SECTION  1991   . DILATION AND CURETTAGE OF UTERUS N/A 09/03/2015   Procedure: EXAM UNDER ANESTHESIA  WITH FROZEN SECTION CERVICAL BIOPSY & ENDOCERVICAL CURRETTINGS;  Surgeon: Aloha Gell, MD;  Location: [facility];  Service: Gynecology;  Laterality: N/A;  . ECHO STRESS TEST  12-10-2014   normal w/ no evidence for stress-induced ischemia/  normal LV function and wall motion , ef 65-70%  . IR GENERIC HISTORICAL  10/25/2015   IR RADIOLOGIST EVAL & MGMT 10/25/2015 Corrie Mckusick, DO [facility]  . IR  GENERIC HISTORICAL  12/20/2015   IR REMOVAL TUN ACCESS W/ PORT W/O FL MOD SED 12/20/2015 Sandi Mariscal, MD [facility]  . LASIK Left 2015  . PORTACATH PLACEMENT  10-11-2015  . TANDEM RING INSERTION N/A 11/09/2015   Procedure: TANDEM RING INSERTION;  Surgeon: Gery Pray, MD;  Location: [facility];  Service: Urology;  Laterality: N/A;  . TANDEM RING INSERTION N/A 11/16/2015   Procedure: TANDEM RING INSERTION;  Surgeon: Gery Pray, MD;  Location: [facility];  Service: Urology;  Laterality: N/A;  . TANDEM RING INSERTION N/A 11/23/2015   Procedure: TANDEM RING INSERTION;  Surgeon: Gery Pray, MD;  Location: [facility];  Service: Urology;  Laterality: N/A;  . TANDEM RING INSERTION N/A 11/30/2015   Procedure: TANDEM RING INSERTION;  Surgeon: Gery Pray, MD;  Location: [facility];  Service: Urology;  Laterality: N/A;  . TANDEM RING INSERTION N/A 12/07/2015   Procedure: TANDEM RING INSERTION;  Surgeon: Gery Pray, MD;  Location: [facility];  Service: Urology;  Laterality: N/A;  . TUBAL LIGATION  1996    Past Medical Hx:  Past Medical History:  Diagnosis Date  . Anemia   . Anticoagulated on Coumadin    started 11-26-2015 for right upper arm deep and superficial vein thrombus  . Cervical cancer [facility]) oncologist-  dr livesay/  dr kinard/  dr Denman George   dx 08/2015-- Stage IIB poorly differentiated SCC--  chemotherapy weekly (started 10-04-2015)  and external radiation (started 10-05-2015) and planned high-dose radiation via tandom ring direct to cervix to start 11-09-2015  . Chemotherapy induced diarrhea   . Chemotherapy-induced nausea   . Deep vein thrombosis, upper right extremity ([place]) 11-25-2015 dx   deep and superficial involving axillary vein, brachial vein, and basilic vein  . Depression   . Dysuria   . GERD (gastroesophageal reflux disease)    caused by chemotherapy  . Headache   . History of thrombophlebitis  RESOLVED-- but per pt prefer no bp or  puncture done in right arm   per pt symptoms 10-20-2015-- on 10-22-2015 dx Superficial thrombus RUE of right basilic vein,where IV access was difficult for first peripheral chemo (not deep venous system) / symptoms resolved with local measures  . Hypothyroidism   . Port-a-cath in place   . Radiation    currently having radiation therapy started 10-04-2015  . Radiation 10/04/15-11/19/15   pelvis 45 Gy, boost to 9 Gy  . Radiation 11/09/15-12/07/15   bracytherapy cervical - 27.5 Gy in 5 fractions  . Superficial thrombophlebitis of arm 11/25/2015   right upper arm-- secondary to IV access difficult for first chemo    Past Gynecological History:  G1P1, + abnormal pap smears remotely. No recent pap history in 5-10 years.  No LMP recorded. Patient is not currently having periods (Reason: Chemotherapy).  Family Hx:  Family History  Problem Relation Age of Onset  . CAD Mother     Premature disease  . Lung cancer Mother   . Lung cancer Father   . Brain cancer Father   . CAD Brother     MI in his 63s    Review of Systems:  Constitutional  + hot flashes  ENT Normal appearing ears and nares bilaterally Skin/Breast  No rash, sores, jaundice, itching, dryness Cardiovascular  No chest pain, shortness of breath, or edema  Pulmonary  No cough or wheeze.  Gastro Intestinal  No nausea, vomitting, or diarrhoea. No bright red blood per rectum, no abdominal pain, change in bowel movement, or constipation.  Genito Urinary  No frequency, urgency, dysuria, no bleeding Musculo Skeletal  No myalgia, arthralgia, + right knee swelling + right upper extremity edema and tenderness Neurologic  No weakness, numbness, change in gait,  Psychology  No depression, anxiety, insomnia.   Vitals:  Blood pressure 110/68, pulse 74, temperature 98.1 F (36.7 C), temperature source Oral, resp. rate 18, height 5\' 2"  (1.575 m), weight 209 lb 6.4 oz (95 kg), SpO2 100  %.  Physical Exam: WD in NAD Neck  Supple NROM, without any enlargements.  Lymph Node Survey No cervical supraclavicular or inguinal adenopathy Cardiovascular  Pulse normal rate, regularity and rhythm. S1 and S2 normal.  Lungs  Clear to auscultation bilateraly, without wheezes/crackles/rhonchi. Good air movement.  Skin  No rash/lesions/breakdown  Psychiatry  Alert and oriented to person, place, and time  Abdomen  Normoactive bowel sounds, abdomen soft, non-tender and obese without evidence of hernia.  Back No CVA tenderness Genito Urinary  Vulva/vagina: Normal external female genitalia.  No lesions. No discharge or bleeding.  Bladder/urethra:  No  lesions or masses, well supported bladder  Vagina: normal with no lesions  Cervix: cervix tilted posteriorally but small with no palpable or visible tumor  Uterus: 10cm minimally mobile, no palpable disease in the parametria  Adnexa: no palpable masses. Rectal  Good tone, no masses no cul de sac nodularity. No parametrial extension minimally bilateraly Extremities  No bilateral cyanosis, clubbing or edema.   Donaciano Eva, MD  04/03/2016, 3:58 PM

## 2016-04-03 NOTE — Telephone Encounter (Signed)
-----   Message from Gordy Levan, MD sent at 02/28/2016 10:19 AM EST ----- Regarding: INR ~ 2 wks and 12-11 02-28-16: coumadin 5 mg today then 7.5 mg daily  Lab for coumadin ~ 2 weeks Lab for coumadin with Dr Serita Grit visit 12-11  Expect to complete coumadin late Dec, which will be nearly 5 months

## 2016-04-13 ENCOUNTER — Other Ambulatory Visit: Payer: Self-pay | Admitting: Gynecologic Oncology

## 2016-04-13 DIAGNOSIS — C539 Malignant neoplasm of cervix uteri, unspecified: Secondary | ICD-10-CM

## 2016-04-13 DIAGNOSIS — E28319 Asymptomatic premature menopause: Secondary | ICD-10-CM

## 2016-06-06 ENCOUNTER — Telehealth: Payer: Self-pay

## 2016-06-06 NOTE — Telephone Encounter (Signed)
Pt called c/o increasing abdominal pain and lower back pain x2 weeks, she was evaluated by her PCP, whom referred her to notify us.  States she has had rectal bleeding present without a bowel movement. Has happened after urination, sees the blood upon wiping. Denies any fever, nausea or diarrhea. Pt was given an appointment for tomorrow.

## 2016-06-07 ENCOUNTER — Encounter: Payer: Self-pay | Admitting: Gynecologic Oncology

## 2016-06-07 ENCOUNTER — Ambulatory Visit: Payer: BLUE CROSS/BLUE SHIELD | Attending: Gynecologic Oncology | Admitting: Gynecologic Oncology

## 2016-06-07 VITALS — BP 121/79 | HR 61 | Temp 98.3°F | Resp 20 | Ht 63.0 in | Wt 208.8 lb

## 2016-06-07 DIAGNOSIS — Z801 Family history of malignant neoplasm of trachea, bronchus and lung: Secondary | ICD-10-CM | POA: Diagnosis not present

## 2016-06-07 DIAGNOSIS — E039 Hypothyroidism, unspecified: Secondary | ICD-10-CM | POA: Diagnosis not present

## 2016-06-07 DIAGNOSIS — R109 Unspecified abdominal pain: Secondary | ICD-10-CM | POA: Diagnosis not present

## 2016-06-07 DIAGNOSIS — Z7901 Long term (current) use of anticoagulants: Secondary | ICD-10-CM | POA: Insufficient documentation

## 2016-06-07 DIAGNOSIS — Z8249 Family history of ischemic heart disease and other diseases of the circulatory system: Secondary | ICD-10-CM | POA: Diagnosis not present

## 2016-06-07 DIAGNOSIS — K219 Gastro-esophageal reflux disease without esophagitis: Secondary | ICD-10-CM | POA: Insufficient documentation

## 2016-06-07 DIAGNOSIS — Z8541 Personal history of malignant neoplasm of cervix uteri: Secondary | ICD-10-CM

## 2016-06-07 DIAGNOSIS — C539 Malignant neoplasm of cervix uteri, unspecified: Secondary | ICD-10-CM

## 2016-06-07 DIAGNOSIS — K625 Hemorrhage of anus and rectum: Secondary | ICD-10-CM | POA: Diagnosis not present

## 2016-06-07 DIAGNOSIS — Z888 Allergy status to other drugs, medicaments and biological substances status: Secondary | ICD-10-CM | POA: Diagnosis not present

## 2016-06-07 DIAGNOSIS — M545 Low back pain: Secondary | ICD-10-CM | POA: Diagnosis not present

## 2016-06-07 DIAGNOSIS — Z9851 Tubal ligation status: Secondary | ICD-10-CM | POA: Diagnosis not present

## 2016-06-07 DIAGNOSIS — Z808 Family history of malignant neoplasm of other organs or systems: Secondary | ICD-10-CM | POA: Insufficient documentation

## 2016-06-07 DIAGNOSIS — Z86718 Personal history of other venous thrombosis and embolism: Secondary | ICD-10-CM | POA: Diagnosis not present

## 2016-06-07 DIAGNOSIS — R232 Flushing: Secondary | ICD-10-CM

## 2016-06-07 DIAGNOSIS — E28319 Asymptomatic premature menopause: Secondary | ICD-10-CM | POA: Diagnosis not present

## 2016-06-07 MED ORDER — CONJ ESTROG-MEDROXYPROGEST ACE 0.625-2.5 MG PO TABS: 1.0000 | ORAL_TABLET | Freq: Every day | ORAL | 6 refills | Status: DC

## 2016-06-07 MED ORDER — MORPHINE SULFATE 15 MG PO TABS: 15.0000 mg | ORAL_TABLET | ORAL | 0 refills | Status: DC | PRN

## 2016-06-07 MED ORDER — VENLAFAXINE HCL ER 37.5 MG PO CP24: ORAL_CAPSULE | ORAL | 6 refills | Status: DC

## 2016-06-07 NOTE — Progress Notes (Signed)
Consult Note: Gyn-Onc  Consult was requested by Dr. Pamala Hurry for the evaluation of Lindsey Hughes 46 y.o. female  CC:  Chief Complaint  Patient presents with  . Abdominal Pain    Assessment/Plan:  Lindsey Hughes  is a 46 y.o.  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  Complete clinical response including on post-treatment restaging PET in October, 2017.  New symptoms of pain. Unexplained by exam. Will order PET/CT to evaluate for occult recurrence or spinal mets.  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 Recommend GI consult and consideration for colonoscopy.  Dr Sondra Come will see her for follow-up in 3 months I will see her for follow-up in 6 months.  Continue Prempro for her to take for radiation induced premature menopause.  HPI: Lindsey Hughes is a very pleasant 46 year old who is seen in consultation at the request of Dr Pamala Hurry for cervical cancer.  The patient reports a 4 month history of peristent daily vaginal bleeding. She has also had back pain and more recently random urinary incontinence.  She cannot remember when she last had a pap smear (5-10 years ago) but she does recall having a history of abnormal paps in the past, though was not treated for these and feels that she "was never told they needed treatment".   As workup for her bleeding she saw Dr Pamala Hurry. An office endometrial biopsy was performed on 08/12/15 and revealed benign, weakly proliferative endoemetrium. During that office exam the posterior lip of the cervix was not visualized due to positioning of the uterus and body habitus. Passage of the pipelle was difficult due to obstruction at the endocervix. Pap was taken but was not resulted by the time of her scheduled hysterectomy. It later returned as ASC-H, positive for high risk HPV.  The patient was scheduled for a robotic hysterectomy and on  09/03/15 was taken to the OR. Intraoperative findings were significant for a very abnormal posterior lip of cervix which was nodular and friable. The cervix could be visualized intra-op under general anesthetic. 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 The procedure was aborted and a referral was made to [place].  PET scan on 09/17/15 showed hypermetabolism of a 5.2cm cervical mass and bilateral external iliac nodes with increased FDG uptake suggestive of metastatic disease. Between 10/04/15 to 11/19/15 she received external beam radiation (45 gy in 25 fractions) with radiosensitizing CDDP. Between 11/09/15- 12/07/15 she received intracavitary brachytherapy (HDR) with 27.5Gy given in 5 fractions. She additionally received a pelvic boost of 9 Gy. She tolerated treatment well with no major GI or GU toxicity.  She did develop a Rt UE DVT for which her port was removed and she was started on lovenox and coumadin.  She did develop symptoms of menopause with hot flashes.  Post treatment re-staging PET scan was performed on 01/27/16 which showed complete clinical response in the previously enlarged and avid pelvic nodes. No residual cervical mass and residual cervical FDG avidity of 4 demonstrating complete clinical response. No new lesions.   Interval Hx: She is reporting 2 week history (since mid January, 2018) of constant severe mid low back pain. She denies exacerbating positions or movements. Nothing relieves it. She has tried ibuprofen with no relief. Not associated with other symptoms. She had an episode in early February, 2018 of bright red rectal bleeding (  1 episode) with none since. She has no vaginal bleeding. She is sexually active. She denies LE edema. She denies diarrhea but does have some "loose stools".  Current Meds:  Outpatient Encounter Prescriptions as of 06/07/2016  Medication Sig  .  acetaminophen (TYLENOL) 500 MG tablet Take 500 mg by mouth every 6 (six) hours as needed. Reported on 11/02/2015  . estrogen, conjugated,-medroxyprogesterone (PREMPRO) 0.625-2.5 MG tablet Take 1 tablet by mouth daily.  Marland Kitchen levothyroxine (SYNTHROID, LEVOTHROID) 25 MCG tablet   . rosuvastatin (CRESTOR) 5 MG tablet Take 5 mg by mouth.  . venlafaxine XR (EFFEXOR-XR) 37.5 MG 24 hr capsule Take 1 capsule daily with breakfast. For hotflashes  . [DISCONTINUED] estrogen, conjugated,-medroxyprogesterone (PREMPRO) 0.625-2.5 MG tablet Take 1 tablet by mouth daily.  . [DISCONTINUED] venlafaxine XR (EFFEXOR-XR) 37.5 MG 24 hr capsule Take 1 capsule daily with breakfast. For hotflashes  . morphine (MSIR) 15 MG tablet Take 1 tablet (15 mg total) by mouth every 4 (four) hours as needed for severe pain.  . traZODone (DESYREL) 50 MG tablet Take 50 mg by mouth.  . [DISCONTINUED] warfarin (COUMADIN) 5 MG tablet Take 7.5 mg daily or as directed (Patient not taking: Reported on 06/07/2016)   No facility-administered encounter medications on file as of 06/07/2016.     Allergy:  Allergies  Allergen Reactions  . Zofran [Ondansetron Hcl] Other (See Comments)    headache    Social Hx:   Social History   Social History  . Marital status: Married    Spouse name: N/A  . Number of children: 1  . Years of education: N/A   Occupational History  . Not on file.   Social History Main Topics  . Smoking status: Never Smoker  . Smokeless tobacco: Never Used  . Alcohol use No  . Drug use: No  . Sexual activity: Yes    Birth control/ protection: Surgical   Other Topics Concern  . Not on file   Social History Narrative  . No narrative on file    Past Surgical Hx:  Past Surgical History:  Procedure Laterality Date  . CARPAL TUNNEL RELEASE Bilateral 2002 and 2009  . CESAREAN SECTION  1991   . DILATION AND CURETTAGE OF UTERUS N/A 09/03/2015   Procedure: EXAM UNDER ANESTHESIA  WITH FROZEN SECTION CERVICAL BIOPSY &  ENDOCERVICAL CURRETTINGS;  Surgeon: Aloha Gell, MD;  Location: [facility];  Service: Gynecology;  Laterality: N/A;  . ECHO STRESS TEST  12-10-2014   normal w/ no evidence for stress-induced ischemia/  normal LV function and wall motion , ef 65-70%  . IR GENERIC HISTORICAL  10/25/2015   IR RADIOLOGIST EVAL & MGMT 10/25/2015 Corrie Mckusick, DO [facility]  . IR GENERIC HISTORICAL  12/20/2015   IR REMOVAL TUN ACCESS W/ PORT W/O FL MOD SED 12/20/2015 Sandi Mariscal, MD [facility]  . LASIK Left 2015  . PORTACATH PLACEMENT  10-11-2015  . TANDEM RING INSERTION N/A 11/09/2015   Procedure: TANDEM RING INSERTION;  Surgeon: Gery Pray, MD;  Location: [facility];  Service: Urology;  Laterality: N/A;  . TANDEM RING INSERTION N/A 11/16/2015   Procedure: TANDEM RING INSERTION;  Surgeon: Gery Pray, MD;  Location: [facility];  Service: Urology;  Laterality: N/A;  . TANDEM RING INSERTION N/A 11/23/2015   Procedure: TANDEM RING INSERTION;  Surgeon: Gery Pray, MD;  Location: [facility];  Service: Urology;  Laterality: N/A;  . TANDEM RING INSERTION N/A 11/30/2015   Procedure: TANDEM RING INSERTION;  Surgeon: Gery Pray, MD;  Location: [facility];  Service: Urology;  Laterality: N/A;  . TANDEM RING INSERTION N/A 12/07/2015   Procedure: TANDEM RING INSERTION;  Surgeon: Gery Pray, MD;  Location: [facility];  Service: Urology;  Laterality: N/A;  . TUBAL LIGATION  1996    Past Medical Hx:  Past Medical History:  Diagnosis Date  . Anemia   . Anticoagulated on Coumadin    started 11-26-2015 for right upper arm deep and superficial vein thrombus  . Cervical cancer [facility]) oncologist-  dr livesay/  dr kinard/  dr Denman George   dx 08/2015-- Stage IIB poorly differentiated SCC--  chemotherapy weekly (started 10-04-2015)  and external radiation (started 10-05-2015) and planned high-dose radiation via tandom ring direct to cervix to start 11-09-2015   . Chemotherapy induced diarrhea   . Chemotherapy-induced nausea   . Deep vein thrombosis, upper right extremity ([place]) 11-25-2015 dx   deep and superficial involving axillary vein, brachial vein, and basilic vein  . Depression   . Dysuria   . GERD (gastroesophageal reflux disease)    caused by chemotherapy  . Headache   . History of thrombophlebitis RESOLVED-- but per pt prefer no bp or  puncture done in right arm   per pt symptoms 10-20-2015-- on 10-22-2015 dx Superficial thrombus RUE of right basilic vein,where IV access was difficult for first peripheral chemo (not deep venous system) / symptoms resolved with local measures  . Hypothyroidism   . Port-a-cath in place   . Radiation    currently having radiation therapy started 10-04-2015  . Radiation 10/04/15-11/19/15   pelvis 45 Gy, boost to 9 Gy  . Radiation 11/09/15-12/07/15   bracytherapy cervical - 27.5 Gy in 5 fractions  . Superficial thrombophlebitis of arm 11/25/2015   right upper arm-- secondary to IV access difficult for first chemo    Past Gynecological History:  G1P1, + abnormal pap smears remotely. No recent pap history in 5-10 years.  No LMP recorded. Patient is not currently having periods (Reason: Chemotherapy).  Family Hx:  Family History  Problem Relation Age of Onset  . CAD Mother     Premature disease  . Lung cancer Mother   . Lung cancer Father   . Brain cancer Father   . CAD Brother     MI in his 38s    Review of Systems:  Constitutional  + hot flashes  ENT Normal appearing ears and nares bilaterally Skin/Breast  No rash, sores, jaundice, itching, dryness Cardiovascular  No chest pain, shortness of breath, or edema  Pulmonary  No cough or wheeze.  Gastro Intestinal  No nausea, vomitting, or diarrhoea. No bright red blood per rectum, no abdominal pain, change in bowel movement, or constipation.  Genito Urinary  No frequency, urgency, dysuria, no bleeding Musculo Skeletal  No myalgia,  arthralgia, + right knee swelling + right upper extremity edema and tenderness Neurologic  No weakness, numbness, change in gait,  Psychology  No depression, anxiety, insomnia.   Vitals:  Blood pressure 121/79, pulse 61, temperature 98.3 F (36.8 C), temperature source Oral, resp. rate 20, height 5\' 3"  (1.6 m), weight 208 lb 12.8 oz (94.7 kg), SpO2 100 %.  Physical Exam: WD in NAD Neck  Supple NROM, without any enlargements.  Lymph Node Survey No cervical supraclavicular or inguinal adenopathy Cardiovascular  Pulse normal rate, regularity and rhythm. S1 and S2 normal.  Lungs  Clear to auscultation bilateraly, without wheezes/crackles/rhonchi. Good air movement.  Skin  No  rash/lesions/breakdown  Psychiatry  Alert and oriented to person, place, and time  Abdomen  Normoactive bowel sounds, abdomen soft, non-tender and obese without evidence of hernia.  Back No CVA tenderness Genito Urinary  Vulva/vagina: Normal external female genitalia.  No lesions. No discharge or bleeding.  Bladder/urethra:  No lesions or masses, well supported bladder  Vagina: normal with no lesions  Cervix: agglutinated upper vagina. No palpable or visible tumor.  Uterus: 10cm minimally mobile, no palpable disease in the parametria  Adnexa: no palpable masses. Rectal  Good tone, no masses no cul de sac nodularity. No blood on glove. No parametrial extension minimally bilateraly Extremities  No bilateral cyanosis, clubbing or edema.   Donaciano Eva, MD  06/07/2016, 3:51 PM

## 2016-06-07 NOTE — Patient Instructions (Signed)
Dr Denman George is going to order a Pet Scan for you. Our office will contact you once prior authorization is obtained from your insurance provider and we are allowed to schedule the test for you. Our office will also contact you with an appointment to [facility].

## 2016-06-13 ENCOUNTER — Telehealth: Payer: Self-pay

## 2016-06-13 NOTE — Telephone Encounter (Signed)
Pre authorization approved for PET scan, appointment booked for 06/21/16 at 2pm. Pt aware of appointment.

## 2016-06-21 ENCOUNTER — Telehealth: Payer: Self-pay

## 2016-06-21 ENCOUNTER — Ambulatory Visit (HOSPITAL_COMMUNITY)
Admission: RE | Admit: 2016-06-21 | Discharge: 2016-06-21 | Disposition: A | Discharge status: Home / Self Care | Payer: BLUE CROSS/BLUE SHIELD | Source: Ambulatory Visit | Attending: Gynecologic Oncology | Admitting: Gynecologic Oncology

## 2016-06-21 DIAGNOSIS — K625 Hemorrhage of anus and rectum: Secondary | ICD-10-CM

## 2016-06-21 DIAGNOSIS — E042 Nontoxic multinodular goiter: Secondary | ICD-10-CM | POA: Insufficient documentation

## 2016-06-21 DIAGNOSIS — C539 Malignant neoplasm of cervix uteri, unspecified: Secondary | ICD-10-CM

## 2016-06-21 LAB — GLUCOSE, CAPILLARY: GLUCOSE-CAPILLARY: 99 mg/dL (ref 65–99)

## 2016-06-21 MED ORDER — FLUDEOXYGLUCOSE F - 18 (FDG) INJECTION
10.4600 | Freq: Once | INTRAVENOUS | Status: AC | PRN
  Administered 2016-06-21: 10.46 via INTRAVENOUS

## 2016-06-21 NOTE — Telephone Encounter (Signed)
Pt notified PET scan results and verbalized understanding of need to continue follow up with GI for rectal bleeding and to f/u with thyroid testing with her PCP.

## 2016-06-21 NOTE — Telephone Encounter (Signed)
-----   Message from Everitt Amber, MD sent at 06/21/2016  3:42 PM EST ----- Jovita Gamma, Would you mind letting Ms Sato know that her PET showed no abnormalities in the abdomen (no sign of the cancer come back, and no clear explanation for her pain). I recommend continued work up with GI for her rectal bleeding (which is likely secondary to her radiation).  She has some nodularity to her thyroid gland, and should follow-up with her PCP, Anderson Malta Couillard regarding workup of this as I do not treat or work-up thyroid disease.  Donaciano Eva, MD

## 2016-06-29 ENCOUNTER — Other Ambulatory Visit: Payer: Self-pay | Admitting: Physician Assistant

## 2016-06-29 DIAGNOSIS — E041 Nontoxic single thyroid nodule: Secondary | ICD-10-CM

## 2016-07-04 ENCOUNTER — Ambulatory Visit
Admission: RE | Admit: 2016-07-04 | Discharge: 2016-07-04 | Disposition: A | Discharge status: Home / Self Care | Payer: BLUE CROSS/BLUE SHIELD | Source: Ambulatory Visit | Attending: Physician Assistant | Admitting: Physician Assistant

## 2016-07-04 DIAGNOSIS — E041 Nontoxic single thyroid nodule: Secondary | ICD-10-CM

## 2016-07-05 ENCOUNTER — Encounter: Payer: Self-pay | Admitting: Gastroenterology

## 2016-07-06 ENCOUNTER — Ambulatory Visit: Payer: BLUE CROSS/BLUE SHIELD | Admitting: Radiation Oncology

## 2016-07-20 ENCOUNTER — Ambulatory Visit
Admission: RE | Admit: 2016-07-20 | Discharge: 2016-07-20 | Disposition: A | Discharge status: Home / Self Care | Payer: BLUE CROSS/BLUE SHIELD | Source: Ambulatory Visit | Attending: Radiation Oncology | Admitting: Radiation Oncology

## 2016-07-20 ENCOUNTER — Ambulatory Visit: Payer: BLUE CROSS/BLUE SHIELD | Admitting: Radiation Oncology

## 2016-07-20 VITALS — BP 121/80 | HR 81 | Temp 98.3°F | Resp 16 | Wt 211.0 lb

## 2016-07-20 DIAGNOSIS — C531 Malignant neoplasm of exocervix: Secondary | ICD-10-CM | POA: Diagnosis not present

## 2016-07-20 DIAGNOSIS — M549 Dorsalgia, unspecified: Secondary | ICD-10-CM | POA: Diagnosis not present

## 2016-07-20 DIAGNOSIS — Z923 Personal history of irradiation: Secondary | ICD-10-CM | POA: Diagnosis not present

## 2016-07-20 MED ORDER — HYDROCODONE-ACETAMINOPHEN 5-325 MG PO TABS: 1.0000 | ORAL_TABLET | Freq: Four times a day (QID) | ORAL | 0 refills | Status: DC | PRN

## 2016-07-20 NOTE — Progress Notes (Signed)
Antonya Creasman is here today for follow up from cervical cancer.  Patient states she is having back pain that is radiating down left leg.  She states she went to PCP and they Xray and stated that it was arthritis and was prescribed morphine but declined using.  She is using Motrin and Tylenol but states that is not working.  She is also supposed to be getting injections to help with the pain.  She states she has some slight fatigue.  Denies any issues with appetite.  Denies vaginal bleeding.  Denies blood in urine & Stool.  Denies using a dilator but does state she is sexual activity.    Vitals:   07/20/16 1533  BP: 121/80  Pulse: 81  Resp: 16  Temp: 98.3 F (36.8 C)  TempSrc: Oral  SpO2: 100%  Weight: 211 lb (95.7 kg)    Wt Readings from Last 3 Encounters:  07/20/16 211 lb (95.7 kg)  06/07/16 208 lb 12.8 oz (94.7 kg)  04/03/16 209 lb 6.4 oz (95 kg)

## 2016-07-20 NOTE — Progress Notes (Signed)
Radiation Oncology         [phone] ________________________________  Name: Lindsey Hughes MRN: [id number]  Date: 07/20/2016  DOB: 14-Mar-1971  Follow-Up Visit Note  CC: Couillard, Frazier Butt, MD    ICD-9-CM ICD-10-CM   1. 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      Interval Since Last Radiation: 7 months External Beam Radiation to the Pelvis: 10/04/15 - 11/19/15 HDR-Cervical: 11/09/15 - 12/07/15  Site/dose:   1) Pelvis: 45 Gy in 25 fractions.  2) Pelvic Boost: 9 Gy in 5 fractions.  3) Brachytherapy: 27.5 Gy in 5 fractions. Tandem/ring system.  Narrative:  The patient returns today for routine follow-up of radiation completed 12/07/15.  On review of systems, the patient states she is having back pain that is radiating down her left leg. She states she saw her PCP and was diagnosed with arthritis; she was prescribed morphine but declined using. She is using Motrin and Tylenol for pain management, but states that is not working. Additionally, the patient is supposed to be getting injections to help with the pain.  She reports she has some slight fatigue. She denies any issues with appetite. Denies vaginal bleeding. Denies hematuria or hematochezia. Denies using a dilator but does state she is sexually active.    ALLERGIES:  is allergic to zofran [ondansetron hcl].  Meds: Current Outpatient Prescriptions  Medication Sig Dispense Refill  . acetaminophen (TYLENOL) 500 MG tablet Take 500 mg by mouth every 6 (six) hours as needed. Reported on 11/02/2015    . estrogen, conjugated,-medroxyprogesterone (PREMPRO) 0.625-2.5 MG tablet Take 1 tablet by mouth daily. 30 tablet 6  . ibuprofen (ADVIL,MOTRIN) 200 MG tablet Take 200 mg by mouth every 6 (six) hours as needed.    Marland Kitchen levothyroxine (SYNTHROID, LEVOTHROID) 25 MCG tablet   0  . morphine (MSIR) 15 MG tablet Take 1 tablet (15  mg total) by mouth every 4 (four) hours as needed for severe pain. (Patient not taking: Reported on 07/20/2016) 50 tablet 0  . rosuvastatin (CRESTOR) 5 MG tablet Take 5 mg by mouth.    . traZODone (DESYREL) 50 MG tablet Take 50 mg by mouth.    . venlafaxine XR (EFFEXOR-XR) 37.5 MG 24 hr capsule Take 1 capsule daily with breakfast. For hotflashes (Patient not taking: Reported on 07/20/2016) 30 capsule 6   No current facility-administered medications for this encounter.     Physical Findings: The patient is in no acute distress. Patient is alert and oriented.  weight is 211 lb (95.7 kg). Her oral temperature is 98.3 F (36.8 C). Her blood pressure is 121/80 and her pulse is 81. Her respiration is 16 and oxygen saturation is 100%.   Lungs are clear to auscultation bilaterally. Heart is regular in rate and rhythm. No palpable cervical, supraclavicular, or axillary adenopathy. Abdomen soft, non tender, with normal active bowel sounds. Normal external genitalia. On speculum exam, the vaginal vault is somewhat narrowed but does permit a small speculum. There are some radiation changes noted in the upper vaginal vault. Cervix is agglutinated to the upper vaginal area. On bimanual and rectovaginal exam there are no pelvic masses appreciated.    Lab Findings: Lab Results  Component Value Date   WBC 4.9 02/28/2016   HGB 12.6 02/28/2016   HCT 37.3 02/28/2016   MCV 95.4 02/28/2016   PLT 268 02/28/2016    Radiographic Findings:  Nm Pet Image Restag (ps) Skull Base To Thigh  Result Date: 06/21/2016 CLINICAL DATA:  Subsequent treatment strategy for cervical cancer. EXAM: NUCLEAR MEDICINE PET SKULL BASE TO THIGH TECHNIQUE: 10.5 mCi F-18 FDG was injected intravenously. Full-ring PET imaging was performed from the skull base to thigh after the radiotracer. CT data was obtained and used for attenuation correction and anatomic localization. FASTING BLOOD GLUCOSE:  Value: 99 mg/dl COMPARISON:  Multiple exams,  including 01/27/2016 FINDINGS: NECK No hypermetabolic lymph nodes in the neck. Reasonably symmetric activity along the tonsillar pillars is likely physiologic. There is some focal hypermetabolic activity in the left thyroid lobe without a well-defined CT correlate, maximum SUV 8.8. The contralateral thyroid lobe has a maximum SUV of 4.4. CHEST No hypermetabolic mediastinal or hilar nodes. No suspicious pulmonary nodules on the CT scan. ABDOMEN/PELVIS No abnormal hypermetabolic activity within the liver, pancreas, adrenal glands, or spleen. No hypermetabolic lymph nodes in the abdomen or pelvis. Fatty deposition in the wall of the ascending colon and terminal ileum, usually this is a normal variant but it can be associated with inflammatory bowel disease. Endometrial stripe measures about 10 mm in thickness although this is measured on axial images rather than sagittal. Low-density in the expected location of the cervix, query partial resection versus fluid in the cervical canal. No significant abnormal hypermetabolic activity in the cervix currently. SKELETON No focal hypermetabolic activity to suggest skeletal metastasis. IMPRESSION: 1. No current residual cervical hypermetabolic activity to suggest active malignancy. Fluid density in the vicinity of the cervix is likely from fluid in the residual cervical canal. 2. Focal hypermetabolic activity in the left thyroid lobe without a well-defined CT correlate either today or on the recent CT neck from 12/03/2015. Thyroid ultrasound is recommended for further characterization. Specifically, a small minority of hypermetabolic thyroid nodules can actually represent thyroid cancer, and this merits further characterization. This could alternatively be due to a mildly asymmetric low grade thyroiditis. Electronically Signed   By: Van Clines M.D.   On: 06/21/2016 15:19   US Thyroid  Result Date: 07/05/2016 CLINICAL DATA:  Incidental on PET. Hypermetabolic left  thyroid abnormality. EXAM: THYROID ULTRASOUND TECHNIQUE: Ultrasound examination of the thyroid gland and adjacent soft tissues was performed. COMPARISON:  PET 06/21/2016 FINDINGS: Parenchymal Echotexture: Normal Isthmus: 0.3 cm Right lobe: 4.4 x 1.2 x 1.9 cm Left lobe: 4.0 x 1.1 x 1.5 cm _________________________________________________________ Estimated total number of nodules >/= 1 cm: 0 Number of spongiform nodules >/=  2 cm not described below (TR1): 0 Number of mixed cystic and solid nodules >/= 1.5 cm not described below (TR2): 0 _________________________________________________________ There is a 0.6 x 0.4 x 0.6 cm hypoechoic nodule posterior to the mid to lower right lobe. It is external to the gland. There is no evidence of a left thyroid nodule. IMPRESSION: There is no evidence of a left thyroid nodule. Specifically, there is no correlate to the abnormality noted on the accompanying PET-CT. There is a 0.6 cm external to the thyroid gland and posterior to the mid to lower right lobe. This may be parathyroid in origin. It is nonspecific. The above is in keeping with the ACR TI-RADS recommendations - J Am Coll Radiol 2017;14:587-595. Electronically Signed   By: Marybelle Killings M.D.   On: 07/05/2016 07:54    Impression:   No evidence of disease recurrence on clinical exam and recent PET scan Patient has persistent pain in lower back which seems to be worsening. We will order an MRI. She may  have a possible disc problem. She does have some radicular type symptoms with pain radiating into the left leg.  Plan: Patient will follow up with Dr. Denman George in June and radiation oncology in September. Prescribed Vicodin for back pain. She is hesitant to use the morphine that Dr. Denman George gave her. The patient will be seeing gastroenterology in the near future, but she has not had any more rectal bleeding at this time. She does have increased bowel frequency (10 bowel movements per day), and I have advised her to use Imodium  as needed for this issue.  -----------------------------------  Blair Promise, PhD, MD  This document serves as a record of services personally performed by Gery Pray, MD. It was created on his behalf by Maryla Morrow, a trained medical scribe. The creation of this record is based on the scribe's personal observations and the provider's statements to them. This document has been checked and approved by the attending provider.

## 2016-07-21 ENCOUNTER — Ambulatory Visit: Payer: Self-pay | Admitting: Radiation Oncology

## 2016-07-24 ENCOUNTER — Telehealth: Payer: Self-pay | Admitting: *Deleted

## 2016-07-24 NOTE — Telephone Encounter (Signed)
CALLED PATIENT TO INFORM OF MRI FOR 07-28-16 - ARRIVAL TIME - 11:45 AM @ [facility] , NO RESTRICTIONS TO TEST, @ [facility], LVM FOR A RETURN CALL

## 2016-07-28 ENCOUNTER — Ambulatory Visit (HOSPITAL_COMMUNITY): Admission: RE | Admit: 2016-07-28 | Payer: BLUE CROSS/BLUE SHIELD | Source: Ambulatory Visit

## 2016-07-28 ENCOUNTER — Telehealth: Payer: Self-pay | Admitting: *Deleted

## 2016-07-28 NOTE — Telephone Encounter (Signed)
Received a phone call from this patient wanting to cancel MRI for today and has requested that this scan be rescheduled for next week, test rescheduled for 08-02-16 - arrival time - 12:45 pm @ [facility], lvm for a return call

## 2016-08-02 ENCOUNTER — Ambulatory Visit (HOSPITAL_COMMUNITY)
Admission: RE | Admit: 2016-08-02 | Discharge: 2016-08-02 | Disposition: A | Discharge status: Home / Self Care | Payer: BLUE CROSS/BLUE SHIELD | Source: Ambulatory Visit | Attending: Radiation Oncology | Admitting: Radiation Oncology

## 2016-08-02 DIAGNOSIS — C531 Malignant neoplasm of exocervix: Secondary | ICD-10-CM

## 2016-08-02 DIAGNOSIS — M5127 Other intervertebral disc displacement, lumbosacral region: Secondary | ICD-10-CM | POA: Diagnosis not present

## 2016-08-02 DIAGNOSIS — M5126 Other intervertebral disc displacement, lumbar region: Secondary | ICD-10-CM | POA: Insufficient documentation

## 2016-08-02 MED ORDER — GADOBENATE DIMEGLUMINE 529 MG/ML IV SOLN: 20.0000 mL | Freq: Once | INTRAVENOUS | Status: DC | PRN

## 2016-08-08 ENCOUNTER — Ambulatory Visit: Payer: BLUE CROSS/BLUE SHIELD | Admitting: Gastroenterology

## 2016-08-22 ENCOUNTER — Telehealth: Payer: Self-pay | Admitting: Oncology

## 2016-08-22 NOTE — Telephone Encounter (Signed)
Patient called and left a message to see if Dr. Sondra Come will refill her medication "for her back."  She was given HYDROcodone-acetaminophen (NORCO/VICODIN) 5-325 MG on 07/20/16.

## 2016-08-23 ENCOUNTER — Telehealth: Payer: Self-pay | Admitting: Oncology

## 2016-08-23 ENCOUNTER — Other Ambulatory Visit: Payer: Self-pay | Admitting: Radiation Oncology

## 2016-08-23 DIAGNOSIS — C531 Malignant neoplasm of exocervix: Secondary | ICD-10-CM

## 2016-08-23 MED ORDER — HYDROCODONE-ACETAMINOPHEN 5-325 MG PO TABS: 1.0000 | ORAL_TABLET | Freq: Four times a day (QID) | ORAL | 0 refills | Status: DC | PRN

## 2016-08-23 NOTE — Telephone Encounter (Signed)
Left a message for patient regarding her MRI results.  Requested a return call.

## 2016-10-02 ENCOUNTER — Ambulatory Visit: Payer: BLUE CROSS/BLUE SHIELD | Attending: Gynecologic Oncology | Admitting: Gynecologic Oncology

## 2016-10-06 ENCOUNTER — Telehealth: Payer: Self-pay | Admitting: *Deleted

## 2016-10-06 NOTE — Telephone Encounter (Signed)
Contacted the patient regarding her missed appt from June 11th. Patient rescheduled to June 20th

## 2016-10-11 ENCOUNTER — Ambulatory Visit: Payer: BLUE CROSS/BLUE SHIELD | Attending: Gynecologic Oncology | Admitting: Gynecologic Oncology

## 2016-10-11 ENCOUNTER — Telehealth: Payer: Self-pay | Admitting: Oncology

## 2016-10-11 ENCOUNTER — Encounter: Payer: Self-pay | Admitting: Gynecologic Oncology

## 2016-10-11 VITALS — BP 130/82 | HR 54 | Temp 98.2°F | Resp 18 | Wt 209.5 lb

## 2016-10-11 DIAGNOSIS — Z801 Family history of malignant neoplasm of trachea, bronchus and lung: Secondary | ICD-10-CM | POA: Insufficient documentation

## 2016-10-11 DIAGNOSIS — G8929 Other chronic pain: Secondary | ICD-10-CM | POA: Diagnosis not present

## 2016-10-11 DIAGNOSIS — R197 Diarrhea, unspecified: Secondary | ICD-10-CM | POA: Diagnosis not present

## 2016-10-11 DIAGNOSIS — Y842 Radiological procedure and radiotherapy as the cause of abnormal reaction of the patient, or of later complication, without mention of misadventure at the time of the procedure: Secondary | ICD-10-CM | POA: Insufficient documentation

## 2016-10-11 DIAGNOSIS — F329 Major depressive disorder, single episode, unspecified: Secondary | ICD-10-CM | POA: Insufficient documentation

## 2016-10-11 DIAGNOSIS — Z923 Personal history of irradiation: Secondary | ICD-10-CM | POA: Insufficient documentation

## 2016-10-11 DIAGNOSIS — C539 Malignant neoplasm of cervix uteri, unspecified: Secondary | ICD-10-CM | POA: Diagnosis present

## 2016-10-11 DIAGNOSIS — Z888 Allergy status to other drugs, medicaments and biological substances status: Secondary | ICD-10-CM | POA: Diagnosis not present

## 2016-10-11 DIAGNOSIS — E28319 Asymptomatic premature menopause: Secondary | ICD-10-CM | POA: Diagnosis not present

## 2016-10-11 DIAGNOSIS — M5442 Lumbago with sciatica, left side: Secondary | ICD-10-CM | POA: Diagnosis not present

## 2016-10-11 DIAGNOSIS — E039 Hypothyroidism, unspecified: Secondary | ICD-10-CM | POA: Diagnosis not present

## 2016-10-11 DIAGNOSIS — Z9889 Other specified postprocedural states: Secondary | ICD-10-CM | POA: Insufficient documentation

## 2016-10-11 DIAGNOSIS — M549 Dorsalgia, unspecified: Secondary | ICD-10-CM | POA: Insufficient documentation

## 2016-10-11 DIAGNOSIS — K627 Radiation proctitis: Secondary | ICD-10-CM

## 2016-10-11 DIAGNOSIS — Z8541 Personal history of malignant neoplasm of cervix uteri: Secondary | ICD-10-CM | POA: Diagnosis not present

## 2016-10-11 DIAGNOSIS — R159 Full incontinence of feces: Secondary | ICD-10-CM | POA: Insufficient documentation

## 2016-10-11 DIAGNOSIS — Z08 Encounter for follow-up examination after completed treatment for malignant neoplasm: Secondary | ICD-10-CM

## 2016-10-11 DIAGNOSIS — Z9221 Personal history of antineoplastic chemotherapy: Secondary | ICD-10-CM | POA: Diagnosis not present

## 2016-10-11 DIAGNOSIS — Z8249 Family history of ischemic heart disease and other diseases of the circulatory system: Secondary | ICD-10-CM | POA: Insufficient documentation

## 2016-10-11 DIAGNOSIS — Z86718 Personal history of other venous thrombosis and embolism: Secondary | ICD-10-CM | POA: Insufficient documentation

## 2016-10-11 DIAGNOSIS — K219 Gastro-esophageal reflux disease without esophagitis: Secondary | ICD-10-CM | POA: Insufficient documentation

## 2016-10-11 DIAGNOSIS — Z79899 Other long term (current) drug therapy: Secondary | ICD-10-CM | POA: Diagnosis not present

## 2016-10-11 DIAGNOSIS — Z7901 Long term (current) use of anticoagulants: Secondary | ICD-10-CM | POA: Diagnosis not present

## 2016-10-11 DIAGNOSIS — Z808 Family history of malignant neoplasm of other organs or systems: Secondary | ICD-10-CM | POA: Diagnosis not present

## 2016-10-11 DIAGNOSIS — K625 Hemorrhage of anus and rectum: Secondary | ICD-10-CM | POA: Insufficient documentation

## 2016-10-11 NOTE — Patient Instructions (Signed)
Try using metamucil as a stool bulking agent. This can be bought over the counter from the supermarket. Use once a day.  Continue to use your prempro until approximately age 46.  I will see you again for follow-up in December, Dr Sondra Come will see you again in September.  Please notify Dr Denman George at phone number [phone] if you notice vaginal bleeding, new pelvic or abdominal pains, bloating, feeling full easy, or a change in bladder or bowel function.

## 2016-10-11 NOTE — Progress Notes (Signed)
Consult Note: Gyn-Onc  Consult was requested by Dr. Pamala Hurry for the evaluation of Lindsey Hughes 46 y.o. female  CC:  Chief Complaint  Patient presents with  . Cervical Cancer    Follow-up    Assessment/Plan:  Lindsey Hughes  is a 46 y.o.  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  Complete clinical response including on post-treatment restaging PET in October, 2017.  1/ cervical cancer: no evidence of recurrence.  Dr Sondra Come will see her for follow-up in 3 months I will see her for follow-up in 6 months.  2/ Back Pain: Disc bulking in lumbar spine on MRI. Unexplained by exam.  PET/CT negative for spinal mets in February 2018. I discussed that I will not be able to prescribe opioids for her spine pain. She will need to select a singular provider of opioids (I recommended her PCP). Ideally nonopioid management should be explored.  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 If returns recommend GI consult and consideration for colonoscopy. 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 Discussed that early cessation of estrogen replacement after radiation induced castration results in higher all cause mortality.  HPI: Lindsey Hughes is a very pleasant 46 year old who is seen in consultation at the request of Dr Pamala Hurry for cervical cancer.  The patient reports a 4 month history of peristent daily vaginal bleeding. She has also had back pain and more recently random urinary incontinence.  She cannot remember when she last had a pap smear (5-10 years ago) but she does recall having a history of abnormal paps in the past, though was not treated for these and feels that she "was never told they needed  treatment".   As workup for her bleeding she saw Dr Pamala Hurry. An office endometrial biopsy was performed on 08/12/15 and revealed benign, weakly proliferative endoemetrium. During that office exam the posterior lip of the cervix was not visualized due to positioning of the uterus and body habitus. Passage of the pipelle was difficult due to obstruction at the endocervix. Pap was taken but was not resulted by the time of her scheduled hysterectomy. It later returned as ASC-H, positive for high risk HPV.  The patient was scheduled for a robotic hysterectomy and on 09/03/15 was taken to the OR. Intraoperative findings were significant for a very abnormal posterior lip of cervix which was nodular and friable. The cervix could be visualized intra-op under general anesthetic. 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 The procedure was aborted and a referral was made to [facility].  PET scan on 09/17/15 showed hypermetabolism of a 5.2cm cervical mass and bilateral external iliac nodes with increased FDG uptake suggestive of metastatic disease. Between 10/04/15 to 11/19/15 she received external beam radiation (45 gy in 25 fractions) with radiosensitizing CDDP. Between 11/09/15- 12/07/15 she received intracavitary brachytherapy (HDR) with 27.5Gy given in 5 fractions. She additionally received a pelvic boost of 9 Gy. She tolerated treatment well with no major GI or GU toxicity.  She did develop a Rt UE DVT for which her port was removed and she was started on lovenox and coumadin.  She did develop symptoms of menopause with hot flashes.  Post treatment  re-staging PET scan was performed on 01/27/16 which showed complete clinical response in the previously enlarged and avid pelvic nodes. No residual cervical mass and residual cervical FDG avidity of 4 demonstrating complete clinical response. No new lesions.   In February  2018 she complained of back pain. PET/CT showed no evidence of malgnancy/recurrence. MRI spine showed some bulging lumbar discs. She had PET avidity in the thyroid gland but negative [place].  Interval Hx: She continues to have loose stools and fecal incontinence. No further rectal bleeding. She continues to have back pain She is taking prempro but was told to stop by her PCP, Anderson Malta Couilliard, due to concern that this would be worse for her cancer.  Current Meds:  Outpatient Encounter Prescriptions as of 10/11/2016  Medication Sig  . acetaminophen (TYLENOL) 500 MG tablet Take 500 mg by mouth every 6 (six) hours as needed. Reported on 11/02/2015  . estrogen, conjugated,-medroxyprogesterone (PREMPRO) 0.625-2.5 MG tablet Take 1 tablet by mouth daily.  Marland Kitchen ibuprofen (ADVIL,MOTRIN) 200 MG tablet Take 200 mg by mouth every 6 (six) hours as needed.  . venlafaxine XR (EFFEXOR-XR) 37.5 MG 24 hr capsule Take 1 capsule daily with breakfast. For hotflashes (Patient taking differently: 75 mg. Take 1 capsule daily with breakfast. For hotflashes)  . HYDROcodone-acetaminophen (NORCO/VICODIN) 5-325 MG tablet Take 1 tablet by mouth every 6 (six) hours as needed for moderate pain. (Patient not taking: Reported on 10/11/2016)  . levothyroxine (SYNTHROID, LEVOTHROID) 25 MCG tablet 50 mcg.   . rosuvastatin (CRESTOR) 5 MG tablet Take 5 mg by mouth.  . traZODone (DESYREL) 50 MG tablet Take 50 mg by mouth.  . [DISCONTINUED] morphine (MSIR) 15 MG tablet Take 1 tablet (15 mg total) by mouth every 4 (four) hours as needed for severe pain. (Patient not taking: Reported on 07/20/2016)   No facility-administered encounter medications on file as of 10/11/2016.     Allergy:  Allergies  Allergen Reactions  . Zofran [Ondansetron Hcl] Other (See Comments)    headache    Social Hx:   Social History   Social History  . Marital status: Married    Spouse name: N/A  . Number of children: 1  . Years of education: N/A    Occupational History  . Not on file.   Social History Main Topics  . Smoking status: Never Smoker  . Smokeless tobacco: Never Used  . Alcohol use No  . Drug use: No  . Sexual activity: Yes    Birth control/ protection: Surgical   Other Topics Concern  . Not on file   Social History Narrative  . No narrative on file    Past Surgical Hx:  Past Surgical History:  Procedure Laterality Date  . CARPAL TUNNEL RELEASE Bilateral 2002 and 2009  . CESAREAN SECTION  1991   . DILATION AND CURETTAGE OF UTERUS N/A 09/03/2015   Procedure: EXAM UNDER ANESTHESIA  WITH FROZEN SECTION CERVICAL BIOPSY & ENDOCERVICAL CURRETTINGS;  Surgeon: Aloha Gell, MD;  Location: [facility];  Service: Gynecology;  Laterality: N/A;  . ECHO STRESS TEST  12-10-2014   normal w/ no evidence for stress-induced ischemia/  normal LV function and wall motion , ef 65-70%  . IR GENERIC HISTORICAL  10/25/2015   IR RADIOLOGIST EVAL & MGMT 10/25/2015 Corrie Mckusick, DO [facility]  . IR GENERIC HISTORICAL  12/20/2015   IR REMOVAL TUN ACCESS W/ PORT W/O FL MOD SED 12/20/2015 Sandi Mariscal, MD [facility]  . LASIK Left 2015  . PORTACATH PLACEMENT  10-11-2015  . TANDEM RING INSERTION N/A 11/09/2015   Procedure: TANDEM RING INSERTION;  Surgeon: Gery Pray, MD;  Location: [facility];  Service: Urology;  Laterality: N/A;  . TANDEM RING INSERTION N/A 11/16/2015   Procedure: TANDEM RING INSERTION;  Surgeon: Gery Pray, MD;  Location: [facility];  Service: Urology;  Laterality: N/A;  . TANDEM RING INSERTION N/A 11/23/2015   Procedure: TANDEM RING INSERTION;  Surgeon: Gery Pray, MD;  Location: [facility];  Service: Urology;  Laterality: N/A;  . TANDEM RING INSERTION N/A 11/30/2015   Procedure: TANDEM RING INSERTION;  Surgeon: Gery Pray, MD;  Location: [facility];  Service: Urology;  Laterality: N/A;  . TANDEM RING INSERTION N/A 12/07/2015   Procedure: TANDEM RING  INSERTION;  Surgeon: Gery Pray, MD;  Location: [facility];  Service: Urology;  Laterality: N/A;  . TUBAL LIGATION  1996    Past Medical Hx:  Past Medical History:  Diagnosis Date  . Anemia   . Anticoagulated on Coumadin    started 11-26-2015 for right upper arm deep and superficial vein thrombus  . Cervical cancer [facility]) oncologist-  dr livesay/  dr kinard/  dr Denman George   dx 08/2015-- Stage IIB poorly differentiated SCC--  chemotherapy weekly (started 10-04-2015)  and external radiation (started 10-05-2015) and planned high-dose radiation via tandom ring direct to cervix to start 11-09-2015  . Chemotherapy induced diarrhea   . Chemotherapy-induced nausea   . Deep vein thrombosis, upper right extremity ([place]) 11-25-2015 dx   deep and superficial involving axillary vein, brachial vein, and basilic vein  . Depression   . Dysuria   . GERD (gastroesophageal reflux disease)    caused by chemotherapy  . Headache   . History of thrombophlebitis RESOLVED-- but per pt prefer no bp or  puncture done in right arm   per pt symptoms 10-20-2015-- on 10-22-2015 dx Superficial thrombus RUE of right basilic vein,where IV access was difficult for first peripheral chemo (not deep venous system) / symptoms resolved with local measures  . Hypothyroidism   . Port-a-cath in place   . Radiation    currently having radiation therapy started 10-04-2015  . Radiation 10/04/15-11/19/15   pelvis 45 Gy, boost to 9 Gy  . Radiation 11/09/15-12/07/15   bracytherapy cervical - 27.5 Gy in 5 fractions  . Superficial thrombophlebitis of arm 11/25/2015   right upper arm-- secondary to IV access difficult for first chemo    Past Gynecological History:  G1P1, + abnormal pap smears remotely. No recent pap history in 5-10 years.  No LMP recorded. Patient is not currently having periods (Reason: Chemotherapy).  Family Hx:  Family History  Problem Relation Age of Onset  . CAD Mother        Premature disease  .  Lung cancer Mother   . Lung cancer Father   . Brain cancer Father   . CAD Brother        MI in his 37s    Review of Systems:  Constitutional  + hot flashes  ENT Normal appearing ears and nares bilaterally Skin/Breast  No rash, sores, jaundice, itching, dryness Cardiovascular  No chest pain, shortness of breath, or edema  Pulmonary  No cough or wheeze.  Gastro Intestinal  No nausea, vomitting, or diarrhoea. No bright red blood per rectum, no abdominal pain, change in bowel movement, or constipation. + fecal incontinence and loose stools Genito Urinary  No frequency, urgency, dysuria, no bleeding Musculo Skeletal  No myalgia, arthralgia, + right knee swelling + right upper extremity edema and tenderness Neurologic  No weakness, numbness, change in gait,  Psychology  No depression, anxiety, insomnia.   Vitals:  Blood pressure 130/82, pulse (!) 54, temperature 98.2 F (36.8 C), resp. rate 18, weight 209 lb 8 oz (95 kg), SpO2 100 %.  Physical Exam: WD in NAD Neck  Supple NROM, without any enlargements.  Lymph Node Survey No cervical supraclavicular or inguinal adenopathy Cardiovascular  Pulse normal rate, regularity and rhythm. S1 and S2 normal.  Lungs  Clear to auscultation bilateraly, without wheezes/crackles/rhonchi. Good air movement.  Skin  No rash/lesions/breakdown  Psychiatry  Alert and oriented to person, place, and time  Abdomen  Normoactive bowel sounds, abdomen soft, non-tender and obese without evidence of hernia.  Back No CVA tenderness Genito Urinary  Vulva/vagina: Normal external female genitalia.  No lesions. No discharge or bleeding.  Bladder/urethra:  No lesions or masses, well supported bladder  Vagina: normal with no lesions  Cervix: agglutinated upper vagina. No palpable or visible tumor.  Uterus: 10cm minimally mobile, no palpable disease in the parametria  Adnexa: no palpable masses. Rectal  Good tone, no masses no cul de sac nodularity. No  blood on glove. No parametrial extension minimally bilateraly Extremities  No bilateral cyanosis, clubbing or edema.   Donaciano Eva, MD  10/11/2016, 11:54 AM

## 2016-10-11 NOTE — Telephone Encounter (Signed)
Patient called and said she is having sciatic nerve pain in her left lower back that is shooting down to her knee.  She is wondering if Dr. Sondra Come would refill her pain medication (hydrocodone/acetaminophen) that she last had filled on 08/23/16.  She said her primary care doctor told her to ask Dr. Sondra Come since he has been managing this medication.  She also asked if she should be taking Prempro.  She said she will be seeing Dr. Denman George today so advised her to ask at the appointment.

## 2016-10-12 ENCOUNTER — Telehealth: Payer: Self-pay | Admitting: Oncology

## 2016-10-12 NOTE — Telephone Encounter (Signed)
Called patient and advised her that Dr. Sondra Come is recommending that she contact her primary care doctor to see about alternative pain medications (antiinflammatories) for her back pain as it is not cancer related.  Lindsey Hughes verbalized understanding and agreement.

## 2017-01-16 ENCOUNTER — Telehealth: Payer: Self-pay | Admitting: *Deleted

## 2017-01-16 NOTE — Telephone Encounter (Signed)
Per Dr. Denman George, contacted the patient and spoke with her regarding appts on Thursday. Ok to cncel Dr. Clabe Seal appt, patient has no radiation concerns

## 2017-01-18 ENCOUNTER — Ambulatory Visit: Payer: BLUE CROSS/BLUE SHIELD | Attending: Gynecologic Oncology | Admitting: Gynecologic Oncology

## 2017-01-18 ENCOUNTER — Encounter: Payer: Self-pay | Admitting: Gynecologic Oncology

## 2017-01-18 ENCOUNTER — Ambulatory Visit: Payer: BLUE CROSS/BLUE SHIELD | Admitting: Radiation Oncology

## 2017-01-18 VITALS — BP 135/77 | HR 62 | Temp 97.9°F | Ht 63.0 in | Wt 211.6 lb

## 2017-01-18 DIAGNOSIS — Z888 Allergy status to other drugs, medicaments and biological substances status: Secondary | ICD-10-CM | POA: Insufficient documentation

## 2017-01-18 DIAGNOSIS — Z923 Personal history of irradiation: Secondary | ICD-10-CM | POA: Diagnosis not present

## 2017-01-18 DIAGNOSIS — Z86718 Personal history of other venous thrombosis and embolism: Secondary | ICD-10-CM | POA: Insufficient documentation

## 2017-01-18 DIAGNOSIS — Z79899 Other long term (current) drug therapy: Secondary | ICD-10-CM | POA: Diagnosis not present

## 2017-01-18 DIAGNOSIS — Z08 Encounter for follow-up examination after completed treatment for malignant neoplasm: Secondary | ICD-10-CM | POA: Diagnosis not present

## 2017-01-18 DIAGNOSIS — Z8672 Personal history of thrombophlebitis: Secondary | ICD-10-CM | POA: Diagnosis not present

## 2017-01-18 DIAGNOSIS — E039 Hypothyroidism, unspecified: Secondary | ICD-10-CM | POA: Diagnosis not present

## 2017-01-18 DIAGNOSIS — B370 Candidal stomatitis: Secondary | ICD-10-CM

## 2017-01-18 DIAGNOSIS — Z8541 Personal history of malignant neoplasm of cervix uteri: Secondary | ICD-10-CM

## 2017-01-18 DIAGNOSIS — Z8249 Family history of ischemic heart disease and other diseases of the circulatory system: Secondary | ICD-10-CM | POA: Insufficient documentation

## 2017-01-18 DIAGNOSIS — R197 Diarrhea, unspecified: Secondary | ICD-10-CM | POA: Diagnosis not present

## 2017-01-18 DIAGNOSIS — Z9221 Personal history of antineoplastic chemotherapy: Secondary | ICD-10-CM

## 2017-01-18 DIAGNOSIS — F329 Major depressive disorder, single episode, unspecified: Secondary | ICD-10-CM | POA: Diagnosis not present

## 2017-01-18 DIAGNOSIS — Z801 Family history of malignant neoplasm of trachea, bronchus and lung: Secondary | ICD-10-CM | POA: Insufficient documentation

## 2017-01-18 DIAGNOSIS — C531 Malignant neoplasm of exocervix: Secondary | ICD-10-CM

## 2017-01-18 DIAGNOSIS — R159 Full incontinence of feces: Secondary | ICD-10-CM | POA: Diagnosis not present

## 2017-01-18 DIAGNOSIS — E669 Obesity, unspecified: Secondary | ICD-10-CM

## 2017-01-18 DIAGNOSIS — K219 Gastro-esophageal reflux disease without esophagitis: Secondary | ICD-10-CM | POA: Diagnosis not present

## 2017-01-18 DIAGNOSIS — Z808 Family history of malignant neoplasm of other organs or systems: Secondary | ICD-10-CM | POA: Diagnosis not present

## 2017-01-18 MED ORDER — LOPERAMIDE HCL 2 MG PO CAPS: 2.0000 mg | ORAL_CAPSULE | Freq: Two times a day (BID) | ORAL | 3 refills | Status: DC

## 2017-01-18 MED ORDER — NYSTATIN 100000 UNIT/ML MT SUSP: 5.0000 mL | Freq: Four times a day (QID) | OROMUCOSAL | 0 refills | Status: DC

## 2017-01-18 NOTE — Patient Instructions (Signed)
Plan on using the nystatin liquid four times a day until symptoms resolve.  You can swish the medication in your mouth then swallow.  Follow up in three months or sooner if needed.  We will also place a referral to a gastroenterologist.  Please call for any needs.  Dr. Skeet Latch also prescribed Imodium for diarrhea.

## 2017-01-18 NOTE — Progress Notes (Signed)
GYN ONCOLOGY OFFICE VISIT    Referring MD:  Dr. Oren Binet Strand 46 y.o. female  CC:  Chief Complaint  Patient presents with  . Malignant neoplasm of exocervix [facility])    Assessment/Plan:  Ms. Aneisha Skyles  is a 46 y.o.  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  Complete clinical response including on post-treatment restaging PET in October, 2017.  no evidence of recurrence.   will see her for follow-up in 3 months.  2/ Oral candidiasis Nystatin swish and swallow  3/ Diarrhea and fecal incontinence,  - bleeding resolved.  Imodium Rx for QOL.  Gives a h/o remote diagnosis of IBD. GI consult and consideration for colonoscopy.    HPI: Melika Reder is a very pleasant 84 year with cervical cancer.  The patient presented with report of  a 4 month history of peristent daily vaginal bleeding. She has also had back pain and more recently random urinary incontinence.  She cannot remember when she last had a pap smear (5-10 years ago) but she does recall having a history of abnormal paps in the past, though was not treated for these and feels that she "was never told they needed treatment".   As workup for her bleeding she saw Dr Pamala Hurry. An office endometrial biopsy was performed on 08/12/15 and revealed benign, weakly proliferative endoemetrium. During that office exam the posterior lip of the cervix was not visualized due to positioning of the uterus and body habitus. Passage of the pipelle was difficult due to obstruction at the endocervix. Pap was taken but was not resulted by the time of her scheduled hysterectomy. It later returned as ASC-H, positive for high risk HPV.  The patient was scheduled for a robotic hysterectomy and on 09/03/15 was taken to the OR. Intraoperative findings were significant for a very abnormal posterior lip of cervix which was nodular and friable. The  cervix could be visualized intra-op under general anesthetic. 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 The procedure was aborted and a referral was made to [facility].  PET scan on 09/17/15 showed hypermetabolism of a 5.2cm cervical mass and bilateral external iliac nodes with increased FDG uptake suggestive of metastatic disease. Between 10/04/15 to 11/19/15 she received external beam radiation (45 gy in 25 fractions) with radiosensitizing CDDP. Between 11/09/15- 12/07/15 she received intracavitary brachytherapy (HDR) with 27.5Gy given in 5 fractions. She additionally received a pelvic boost of 9 Gy. She tolerated treatment well with no major GI or GU toxicity.  She did develop a Rt UE DVT for which her port was removed and she was started on lovenox and coumadin.  Post treatment re-staging PET scan was performed on 01/27/16 which showed complete clinical response in the previously enlarged and avid pelvic nodes. No residual cervical mass and residual cervical FDG avidity of 4 demonstrating complete clinical response. No new lesions.   In February 2018 she complained of back pain. PET/CT showed no evidence of malgnancy/recurrence. MRI spine showed some bulging lumbar discs. She had PET avidity in the thyroid gland but negative [place].  Interval Hx: She continues to have diarrhea x 12/day and fecal incontinence. No further rectal bleeding.  Soils herself frequently    Current Meds:  Outpatient Encounter Prescriptions as of 01/18/2017  Medication Sig  . estrogen, conjugated,-medroxyprogesterone (PREMPRO) 0.625-2.5 MG tablet Take 1 tablet by mouth  daily.  . levothyroxine (SYNTHROID, LEVOTHROID) 25 MCG tablet 50 mcg.   . rosuvastatin (CRESTOR) 5 MG tablet Take 5 mg by mouth.  . venlafaxine XR (EFFEXOR-XR) 37.5 MG 24 hr capsule Take 1 capsule daily with breakfast. For hotflashes (Patient taking differently: 75  mg. Take 1 capsule daily with breakfast. For hotflashes)  . [DISCONTINUED] acetaminophen (TYLENOL) 500 MG tablet Take 500 mg by mouth every 6 (six) hours as needed. Reported on 11/02/2015  . [DISCONTINUED] HYDROcodone-acetaminophen (NORCO/VICODIN) 5-325 MG tablet Take 1 tablet by mouth every 6 (six) hours as needed for moderate pain. (Patient not taking: Reported on 10/11/2016)  . [DISCONTINUED] ibuprofen (ADVIL,MOTRIN) 200 MG tablet Take 200 mg by mouth every 6 (six) hours as needed.  . [DISCONTINUED] traZODone (DESYREL) 50 MG tablet Take 50 mg by mouth.   No facility-administered encounter medications on file as of 01/18/2017.     Allergy:  Allergies  Allergen Reactions  . Zofran [Ondansetron Hcl] Other (See Comments)    headache    Social Hx:   Social History   Social History  . Marital status: Married    Spouse name: N/A  . Number of children: 1  . Years of education: N/A   Occupational History  . Not on file.   Social History Main Topics  . Smoking status: Never Smoker  . Smokeless tobacco: Never Used  . Alcohol use No  . Drug use: No  . Sexual activity: Yes    Birth control/ protection: Surgical   Other Topics Concern  . Not on file   Social History Narrative  . No narrative on file    Past Surgical Hx:  Past Surgical History:  Procedure Laterality Date  . CARPAL TUNNEL RELEASE Bilateral 2002 and 2009  . CESAREAN SECTION  1991   . DILATION AND CURETTAGE OF UTERUS N/A 09/03/2015   Procedure: EXAM UNDER ANESTHESIA  WITH FROZEN SECTION CERVICAL BIOPSY & ENDOCERVICAL CURRETTINGS;  Surgeon: Aloha Gell, MD;  Location: [facility];  Service: Gynecology;  Laterality: N/A;  . ECHO STRESS TEST  12-10-2014   normal w/ no evidence for stress-induced ischemia/  normal LV function and wall motion , ef 65-70%  . IR GENERIC HISTORICAL  10/25/2015   IR RADIOLOGIST EVAL & MGMT 10/25/2015 Corrie Mckusick, DO [facility]  . IR GENERIC HISTORICAL  12/20/2015   IR REMOVAL TUN ACCESS W/  PORT W/O FL MOD SED 12/20/2015 Sandi Mariscal, MD [facility]  . LASIK Left 2015  . PORTACATH PLACEMENT  10-11-2015  . TANDEM RING INSERTION N/A 11/09/2015   Procedure: TANDEM RING INSERTION;  Surgeon: Gery Pray, MD;  Location: [facility];  Service: Urology;  Laterality: N/A;  . TANDEM RING INSERTION N/A 11/16/2015   Procedure: TANDEM RING INSERTION;  Surgeon: Gery Pray, MD;  Location: [facility];  Service: Urology;  Laterality: N/A;  . TANDEM RING INSERTION N/A 11/23/2015   Procedure: TANDEM RING INSERTION;  Surgeon: Gery Pray, MD;  Location: [facility];  Service: Urology;  Laterality: N/A;  . TANDEM RING INSERTION N/A 11/30/2015   Procedure: TANDEM RING INSERTION;  Surgeon: Gery Pray, MD;  Location[facility];  Service: Urology;  Laterality: N/A;  . TANDEM RING INSERTION N/A 12/07/2015   Procedure: TANDEM RING INSERTION;  Surgeon: Gery Pray, MD;  Location: [facility];  Service: Urology;  Laterality: N/A;  . TUBAL LIGATION  1996    Past Medical Hx:  Past Medical History:  Diagnosis Date  .  Anemia   . Anticoagulated on Coumadin    started 11-26-2015 for right upper arm deep and superficial vein thrombus  . Cervical cancer [facility]) oncologist-  dr livesay/  dr kinard/  dr Denman George   dx 08/2015-- Stage IIB poorly differentiated SCC--  chemotherapy weekly (started 10-04-2015)  and external radiation (started 10-05-2015) and planned high-dose radiation via tandom ring direct to cervix to start 11-09-2015  . Chemotherapy induced diarrhea   . Chemotherapy-induced nausea   . Deep vein thrombosis, upper right extremity ([place]) 11-25-2015 dx   deep and superficial involving axillary vein, brachial vein, and basilic vein  . Depression   . Dysuria   . GERD (gastroesophageal reflux disease)    caused by chemotherapy  . Headache   . History of thrombophlebitis RESOLVED-- but per pt prefer no bp or  puncture done in  right arm   per pt symptoms 10-20-2015-- on 10-22-2015 dx Superficial thrombus RUE of right basilic vein,where IV access was difficult for first peripheral chemo (not deep venous system) / symptoms resolved with local measures  . Hypothyroidism   . Port-a-cath in place   . Radiation    currently having radiation therapy started 10-04-2015  . Radiation 10/04/15-11/19/15   pelvis 45 Gy, boost to 9 Gy  . Radiation 11/09/15-12/07/15   bracytherapy cervical - 27.5 Gy in 5 fractions  . Superficial thrombophlebitis of arm 11/25/2015   right upper arm-- secondary to IV access difficult for first chemo    Past Gynecological History:  G1P1, + abnormal pap smears remotely. No recent pap history in 5-10 years.  No LMP recorded. Patient is not currently having periods (Reason: Chemotherapy).  Family Hx:  Family History  Problem Relation Age of Onset  . CAD Mother        Premature disease  . Lung cancer Mother   . Lung cancer Father   . Brain cancer Father   . CAD Brother        MI in his 5s    Review of Systems:  Constitutional  + hot flashes  ENT Normal appearing ears and nares bilaterally Cardiovascular  No chest pain, shortness of breath, or edema  Pulmonary  No cough or wheeze.  Gastro Intestinal  No nausea, vomitting, or diarrhoea. No bright red blood per rectum, no abdominal pain, change in bowel movement, or constipation. + fecal incontinence and loose stools x 12/day.  Diarrhea precipitated  by gustation. Genito Urinary  No frequency, urgency, dysuria, no bleeding Musculo Skeletal  No myalgia, arthralgia, Neurologic  No weakness, numbness, change in gait,  Psychology  No depression, anxiety, insomnia.   Vitals:  Blood pressure 135/77, pulse 62, temperature 97.9 F (36.6 C), temperature source Oral, height 5\' 3"  (1.6 m), weight 211 lb 9.6 oz (96 kg), SpO2 99 %.  Physical Exam: WD in NAD Neck  Supple NROM, without any enlargements. Oral candidiasis noted. Lymph Node  Survey No cervical supraclavicular or inguinal adenopathy Cardiovascular  Pulse normal rate, regularity and rhythm. S1 and S2 normal.  Lungs  Clear to auscultation bilateraly, without wheezes/crackles/rhonchi. Good air movement.  Skin  No rash/lesions/breakdown  Psychiatry  Alert and oriented to person, place, and time  Abdomen  Normoactive bowel sounds, abdomen soft, non-tender and obese without evidence of hernia.  Back No CVA tenderness Genito Urinary  Vulva/vagina: Normal external female genitalia.  No lesions. No discharge or bleeding.  Bladder/urethra:  No lesions or masses, well supported bladder  Vagina: normal with no lesions  Cervix:Cervix flush with the  vaginal vault.  NO lesions, no parmetrial nodularity No palpable or visible tumor.  Uterus: 10cm minimally mobile,   Adnexa: no palpable masses. Rectal  Good tone, no masses no cul de sac nodularity. No blood on glove. No parametrial extension minimally bilateraly Extremities  No bilateral cyanosis, clubbing or edema.   Janie Morning, MD  01/18/2017, 12:11 PM

## 2017-01-30 ENCOUNTER — Telehealth: Payer: Self-pay | Admitting: *Deleted

## 2017-01-30 NOTE — Telephone Encounter (Signed)
Patient scheduled for Dr. Alessandra Bevels with [facility] on October 15th at 9:45am

## 2017-02-05 ENCOUNTER — Telehealth: Payer: Self-pay | Admitting: *Deleted

## 2017-02-05 NOTE — Telephone Encounter (Signed)
Received a message from [place] at [facility] stating that the patient no showed for her appt for today. [place] APP notified.

## 2017-02-24 ENCOUNTER — Other Ambulatory Visit: Payer: Self-pay | Admitting: Gynecologic Oncology

## 2017-02-24 DIAGNOSIS — R232 Flushing: Secondary | ICD-10-CM

## 2017-03-02 ENCOUNTER — Telehealth: Payer: Self-pay | Admitting: *Deleted

## 2017-03-02 NOTE — Telephone Encounter (Signed)
Patient called to say "I saw my primary care doctor and they found skin cancer on the back of my leg. They told me to call Dr. Serita Grit office to set up appt." Per [name] APP I have advised the patient that Dr. Denman George doesn't treat skin cancer and to call her primary care doctor to refer her to a dermatologist.

## 2017-03-25 ENCOUNTER — Other Ambulatory Visit: Payer: Self-pay

## 2017-03-25 ENCOUNTER — Emergency Department (HOSPITAL_COMMUNITY): Payer: BLUE CROSS/BLUE SHIELD

## 2017-03-25 ENCOUNTER — Encounter (HOSPITAL_COMMUNITY): Payer: Self-pay | Admitting: Emergency Medicine

## 2017-03-25 ENCOUNTER — Emergency Department (HOSPITAL_COMMUNITY)
Admission: EM | Admit: 2017-03-25 | Discharge: 2017-03-25 | Disposition: A | Discharge status: Home / Self Care | Payer: BLUE CROSS/BLUE SHIELD | Attending: Emergency Medicine | Admitting: Emergency Medicine

## 2017-03-25 DIAGNOSIS — Z79899 Other long term (current) drug therapy: Secondary | ICD-10-CM | POA: Insufficient documentation

## 2017-03-25 DIAGNOSIS — N3289 Other specified disorders of bladder: Secondary | ICD-10-CM | POA: Insufficient documentation

## 2017-03-25 DIAGNOSIS — Z8541 Personal history of malignant neoplasm of cervix uteri: Secondary | ICD-10-CM | POA: Diagnosis not present

## 2017-03-25 DIAGNOSIS — E039 Hypothyroidism, unspecified: Secondary | ICD-10-CM | POA: Diagnosis not present

## 2017-03-25 DIAGNOSIS — R300 Dysuria: Secondary | ICD-10-CM | POA: Diagnosis present

## 2017-03-25 DIAGNOSIS — N3090 Cystitis, unspecified without hematuria: Secondary | ICD-10-CM | POA: Insufficient documentation

## 2017-03-25 LAB — URINALYSIS, ROUTINE W REFLEX MICROSCOPIC
BILIRUBIN URINE: NEGATIVE
Glucose, UA: NEGATIVE mg/dL
HGB URINE DIPSTICK: NEGATIVE
Ketones, ur: NEGATIVE mg/dL
LEUKOCYTES UA: NEGATIVE
NITRITE: POSITIVE — AB
PROTEIN: 100 mg/dL — AB
SPECIFIC GRAVITY, URINE: 1.021 (ref 1.005–1.030)
pH: 5 (ref 5.0–8.0)

## 2017-03-25 LAB — I-STAT CHEM 8, ED
BUN: 13 mg/dL (ref 6–20)
CREATININE: 0.9 mg/dL (ref 0.44–1.00)
Calcium, Ion: 1.12 mmol/L — ABNORMAL LOW (ref 1.15–1.40)
Chloride: 105 mmol/L (ref 101–111)
Glucose, Bld: 104 mg/dL — ABNORMAL HIGH (ref 65–99)
HEMATOCRIT: 37 % (ref 36.0–46.0)
HEMOGLOBIN: 12.6 g/dL (ref 12.0–15.0)
POTASSIUM: 3.4 mmol/L — AB (ref 3.5–5.1)
Sodium: 140 mmol/L (ref 135–145)
TCO2: 25 mmol/L (ref 22–32)

## 2017-03-25 MED ORDER — OXYCODONE-ACETAMINOPHEN 5-325 MG PO TABS
1.0000 | ORAL_TABLET | Freq: Once | ORAL | Status: AC
  Administered 2017-03-25: 1 via ORAL
  Filled 2017-03-25: qty 1

## 2017-03-25 MED ORDER — IOPAMIDOL (ISOVUE-300) INJECTION 61%
100.0000 mL | Freq: Once | INTRAVENOUS | Status: AC | PRN
  Administered 2017-03-25: 100 mL via INTRAVENOUS

## 2017-03-25 MED ORDER — OXYBUTYNIN CHLORIDE ER 5 MG PO TB24: 5.0000 mg | ORAL_TABLET | Freq: Every day | ORAL | 0 refills | Status: DC

## 2017-03-25 MED ORDER — KETOROLAC TROMETHAMINE 30 MG/ML IJ SOLN
30.0000 mg | Freq: Once | INTRAMUSCULAR | Status: AC
  Administered 2017-03-25: 30 mg via INTRAVENOUS
  Filled 2017-03-25: qty 1

## 2017-03-25 NOTE — ED Notes (Signed)
Pt in and out of bathroom  Was seen at Urgent care earlier today- Given antibiotic and Azo

## 2017-03-25 NOTE — ED Provider Notes (Signed)
[facility] EMERGENCY DEPARTMENT Provider Note   CSN: [id number] Arrival date & time: 03/25/17  1652     History   Chief Complaint Chief Complaint  Patient presents with  . Dysuria    HPI Lindsey Hughes is a 46 y.o. female who presents to the emergency department with a chief complaint of urinary frequency, hesitancy, bilateral mid-back pain (R>L), and severe, constant bilateral lower abdominal pain that she describes as "spasming" that began yesterday.  She also reports a subjective fever and chills last night, but reports the symptoms have resolved today.  She reports that she has been treated for frequent urinary tract infections and yeast infections since 01/2017.  She reports that she was seen at urgent care and [facility] [place] earlier today who performed a urinalysis, send the urine for culture, and discharged her with a course of Bactrim.  She reports that she came to the emergency department for evaluation, because she felt as if she was not fully emptying her bladder.  No treatment prior to arrival.  She denies vaginal pain, discharge, bleeding, hematuria, nausea, vomiting, diarrhea, melena, or hematochezia.   Past medical history includes cervical cancer that is currently in remission s/p chemotherapy and radiation.  No history of nephrolithiasis.  The history is provided by the patient. No language interpreter was used.    Past Medical History:  Diagnosis Date  . Anemia   . Anticoagulated on Coumadin    started 11-26-2015 for right upper arm deep and superficial vein thrombus  . Cervical cancer [facility]) oncologist-  dr livesay/  dr kinard/  dr Denman George   dx 08/2015-- Stage IIB poorly differentiated SCC--  chemotherapy weekly (started 10-04-2015)  and external radiation (started 10-05-2015) and planned high-dose radiation via tandom ring direct to cervix to start 11-09-2015  . Chemotherapy induced diarrhea   . Chemotherapy-induced nausea   . Deep vein thrombosis, upper right  extremity ([place]) 11-25-2015 dx   deep and superficial involving axillary vein, brachial vein, and basilic vein  . Depression   . Dysuria   . GERD (gastroesophageal reflux disease)    caused by chemotherapy  . Headache   . History of thrombophlebitis RESOLVED-- but per pt prefer no bp or  puncture done in right arm   per pt symptoms 10-20-2015-- on 10-22-2015 dx Superficial thrombus RUE of right basilic vein,where IV access was difficult for first peripheral chemo (not deep venous system) / symptoms resolved with local measures  . Hypothyroidism   . Port-A-Cath in place   . Radiation    currently having radiation therapy started 10-04-2015  . Radiation 10/04/15-11/19/15   pelvis 45 Gy, boost to 9 Gy  . Radiation 11/09/15-12/07/15   bracytherapy cervical - 27.5 Gy in 5 fractions  . Superficial thrombophlebitis of arm 11/25/2015   right upper arm-- secondary to IV access difficult for first chemo    Patient Active Problem List   Diagnosis Date Noted  . Diarrhea 01/18/2017  . 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 Back pain 10/11/2016  . Premature menopause 12/24/2015  . Long term current use of anticoagulant therapy 12/06/2015  . Sensation of lump in throat 12/06/2015  . Hypokalemia 12/06/2015  . UTI (lower urinary tract infection) 11/26/2015  . 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 Cervical cancer, FIGO stage IIB ([facility])   . Superficial thrombophlebitis of arm 10/31/2015  . Portacath in place 10/31/2015  . History of bilateral tubal ligation 09/25/2015  . Vaginal bleeding, abnormal 09/25/2015  .  Thyroid activity decreased 09/25/2015  . Urinary frequency 09/25/2015  . Malignant neoplasm of exocervix ([place]) 09/16/2015  . Obesity (BMI 35.0-39.9 without comorbidity) 09/16/2015    Past Surgical History:  Procedure Laterality Date  . CARPAL TUNNEL RELEASE Bilateral 2002 and 2009  . CESAREAN SECTION  1991   . DILATION AND CURETTAGE OF UTERUS N/A  09/03/2015   Procedure: EXAM UNDER ANESTHESIA  WITH FROZEN SECTION CERVICAL BIOPSY & ENDOCERVICAL CURRETTINGS;  Surgeon: Aloha Gell, MD;  Location: [facility];  Service: Gynecology;  Laterality: N/A;  . ECHO STRESS TEST  12-10-2014   normal w/ no evidence for stress-induced ischemia/  normal LV function and wall motion , ef 65-70%  . IR GENERIC HISTORICAL  10/25/2015   IR RADIOLOGIST EVAL & MGMT 10/25/2015 Corrie Mckusick, DO [facility]  . IR GENERIC HISTORICAL  12/20/2015   IR REMOVAL TUN ACCESS W/ PORT W/O FL MOD SED 12/20/2015 Sandi Mariscal, MD [facility]  . LASIK Left 2015  . PORTACATH PLACEMENT  10-11-2015  . TANDEM RING INSERTION N/A 11/09/2015   Procedure: TANDEM RING INSERTION;  Surgeon: Gery Pray, MD;  Location: [facility];  Service: Urology;  Laterality: N/A;  . TANDEM RING INSERTION N/A 11/16/2015   Procedure: TANDEM RING INSERTION;  Surgeon: Gery Pray, MD;  Location[facility];  Service: Urology;  Laterality: N/A;  . TANDEM RING INSERTION N/A 11/23/2015   Procedure: TANDEM RING INSERTION;  Surgeon: Gery Pray, MD;  Location: [facility];  Service: Urology;  Laterality: N/A;  . TANDEM RING INSERTION N/A 11/30/2015   Procedure: TANDEM RING INSERTION;  Surgeon: Gery Pray, MD;  Location: [facility];  Service: Urology;  Laterality: N/A;  . TANDEM RING INSERTION N/A 12/07/2015   Procedure: TANDEM RING INSERTION;  Surgeon: Gery Pray, MD;  Location: [facility];  Service: Urology;  Laterality: N/A;  . TUBAL LIGATION  1996    OB History    No data available       Home Medications    Prior to Admission medications   Medication Sig Start Date End Date Taking? Authorizing Provider  levothyroxine (SYNTHROID, LEVOTHROID) 50 MCG tablet Take 50 mcg by mouth daily.  03/30/16  Yes [provider]  PREMPRO 0.625-2.5 MG tablet TAKE 1 TABLET DAILY 02/26/17  Yes Everitt Amber, MD    sulfamethoxazole-trimethoprim (BACTRIM DS,SEPTRA DS) 800-160 MG tablet Take 1 tablet by mouth 2 (two) times daily. 03/25/17 04/04/17 Yes [provider]  venlafaxine XR (EFFEXOR-XR) 37.5 MG 24 hr capsule Take 1 capsule daily with breakfast. For hotflashes Patient taking differently: 75 mg. Take 1 capsule daily with breakfast. For hotflashes 06/07/16  Yes Everitt Amber, MD  oxybutynin (DITROPAN XL) 5 MG 24 hr tablet Take 1 tablet (5 mg total) by mouth at bedtime. 03/25/17   Raysean Graumann, Laymond Purser, PA-C    Family History Family History  Problem Relation Age of Onset  . CAD Mother        Premature disease  . Lung cancer Mother   . Lung cancer Father   . Brain cancer Father   . CAD Brother        MI in his 24s    Social History Social History   Tobacco Use  . Smoking status: Never Smoker  . Smokeless tobacco: Never Used  Substance Use Topics  . Alcohol use: No    Alcohol/week: 0.0 oz  . Drug use: No     Allergies   Zofran [ondansetron hcl]  Review of Systems Review of Systems  Constitutional: Positive for chills and fever. Negative for activity change.  Respiratory: Negative for shortness of breath.   Cardiovascular: Negative for chest pain.  Gastrointestinal: Positive for abdominal pain. Negative for abdominal distention, diarrhea, nausea and vomiting.  Genitourinary: Positive for difficulty urinating, dysuria, frequency and urgency. Negative for flank pain, hematuria, vaginal bleeding, vaginal discharge and vaginal pain.  Musculoskeletal: Negative for back pain.  Skin: Negative for rash.  Allergic/Immunologic: Positive for immunocompromised state.     Physical Exam Updated Vital Signs BP 123/83   Pulse 83   Temp 98.1 F (36.7 C) (Oral)   Resp 16   Ht 5\' 2"  (1.575 m)   Wt 96.6 kg (213 lb)   SpO2 99%   BMI 38.96 kg/m   Physical Exam  Constitutional: No distress.  HENT:  Head: Normocephalic.  Eyes: Conjunctivae are normal.  Neck: Neck supple.   Cardiovascular: Normal rate, regular rhythm, normal heart sounds and intact distal pulses. Exam reveals no gallop and no friction rub.  No murmur heard. Pulmonary/Chest: Effort normal and breath sounds normal. No stridor. No respiratory distress. She has no wheezes. She has no rales. She exhibits no tenderness.  Abdominal: Soft. She exhibits no distension and no mass. There is tenderness. There is no rebound and no guarding. No hernia.  Moderate tenderness to palpation over the left lower quadrant and right lower quadrant.  No tenderness over McBurney's point.  Positive bilateral CVA tenderness, right greater than left.  No abdominal distention.  No peritoneal signs.  Neurological: She is alert.  Skin: Skin is warm. No rash noted.  Psychiatric: Her behavior is normal.  Nursing note and vitals reviewed.  ED Treatments / Results  Labs (all labs ordered are listed, but only abnormal results are displayed) Labs Reviewed  URINALYSIS, ROUTINE W REFLEX MICROSCOPIC - Abnormal; Notable for the following components:      Result Value   Color, Urine AMBER (*)    APPearance HAZY (*)    Protein, ur 100 (*)    Nitrite POSITIVE (*)    Bacteria, UA RARE (*)    Squamous Epithelial / LPF 0-5 (*)    All other components within normal limits  I-STAT CHEM 8, ED - Abnormal; Notable for the following components:   Potassium 3.4 (*)    Glucose, Bld 104 (*)    Calcium, Ion 1.12 (*)    All other components within normal limits  URINE CULTURE    EKG  EKG Interpretation None       Radiology Ct Abdomen Pelvis W Contrast  Result Date: 03/25/2017 CLINICAL DATA:  46 year old female with dysuria and lower back pain. Recurrent UTI and pyelonephritis. History of cervical cancer. EXAM: CT ABDOMEN AND PELVIS WITH CONTRAST TECHNIQUE: Multidetector CT imaging of the abdomen and pelvis was performed using the standard protocol following bolus administration of intravenous contrast. CONTRAST:  153mL ISOVUE-300  IOPAMIDOL (ISOVUE-300) INJECTION 61% COMPARISON:  PET-CT dated 06/21/2016 FINDINGS: Lower chest: The visualized lung bases are clear. No intra-abdominal free air or free fluid. Hepatobiliary: No focal liver abnormality is seen. No gallstones, gallbladder wall thickening, or biliary dilatation. Pancreas: Unremarkable. No pancreatic ductal dilatation or surrounding inflammatory changes. Spleen: Normal in size without focal abnormality. Adrenals/Urinary Tract: The adrenal glands are unremarkable. The kidneys appear unremarkable as well. There is symmetric enhancement and excretion of contrast by kidneys bilaterally. There is stranding of the fat surrounding the distal ureters. There is diffuse thickened appearance of the bladder wall with  mild perivesical stranding concerning for cystitis. Correlation with urinalysis is recommended. The urinary bladder is nearly collapsed. Stomach/Bowel: There is no evidence of bowel obstruction or active inflammation. There is diffuse submucosal fat deposit along the colon, likely sequela of chronic inflammation. The appendix is not visualized, likely surgically absent. Vascular/Lymphatic: No significant vascular findings are present. No enlarged abdominal or pelvic lymph nodes. Reproductive: The uterus is anteverted and grossly unremarkable. Bilateral tubal ligation clips noted. Evaluation of the uterus and cervix is limited on CT. The ovaries are grossly unremarkable as visualized. Other: None Musculoskeletal: No acute or significant osseous findings. IMPRESSION: 1. Thickened appearance of the bladder wall can't stranding of the distal periureteric fat concerning for UTI. Correlation with urinalysis recommended. No evidence of pyelonephritis by CT. No fluid collection or abscess. 2. No bowel obstruction or active inflammation. Electronically Signed   By: Anner Crete M.D.   On: 03/25/2017 21:25    Procedures Procedures (including critical care time)  Medications Ordered in  ED Medications  oxyCODONE-acetaminophen (PERCOCET/ROXICET) 5-325 MG per tablet 1 tablet (1 tablet Oral Given 03/25/17 1843)  iopamidol (ISOVUE-300) 61 % injection 100 mL (100 mLs Intravenous Contrast Given 03/25/17 2036)  ketorolac (TORADOL) 30 MG/ML injection 30 mg (30 mg Intravenous Given 03/25/17 2149)     Initial Impression / Assessment and Plan / ED Course  I have reviewed the triage vital signs and the nursing notes.  Pertinent labs & imaging results that were available during my care of the patient were reviewed by me and considered in my medical decision making (see chart for details).     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 Pain controlled in the ED. On physical exam the patient had bilateral lower abdominal tenderness to palpation and bilateral CVA tenderness. UA consistent with infection. Urine culture pending CT A/P negative for pyelonephritis, nephrolithiasis, appendicitis, or diverticulitis. The urinary bladder was noted to be nearly collapsed, which could account for her recurrent UTIs.  She reports that she has an initial appointment with urology scheduled for later this month. The patient was seen by UC for the same symptoms this morning and already filled the prescription she was given for Bactrim and has taken the first dose. Will d/c her home with a course of oxybutynin for bladder spasms.  The patient acknowledged the plan and is in agreement at this time.  Strict return precautions given.  No acute distress.  The patient is safe for discharge at this time.  Final Clinical Impressions(s) / ED Diagnoses   Final diagnoses:  Cystitis  Spasm of bladder    ED Discharge Orders        Ordered    oxybutynin (DITROPAN XL) 5 MG 24 hr tablet  Daily at bedtime     03/25/17 2152       Joline Maxcy A,  PA-C 03/25/17 2219    Mesner, Corene Cornea, MD 03/25/17 2255

## 2017-03-25 NOTE — ED Notes (Addendum)
Pt reports that she is unsure of she is emptying her bladder  Bladder Scan of 12 ml after void  Pt has an appt for a colonoscopy on Tuesday  Has finished her tx for cervical Ca per spouse

## 2017-03-25 NOTE — ED Notes (Signed)
Call to lab POC: Lindsey Hughes that pt urine will be cultured

## 2017-03-25 NOTE — ED Triage Notes (Addendum)
Patient c/o dysuria and lower back pain that started yesterday. Per patient reoccurrence of UTIs for past 2-3 months. Denies any nausea or vomiting. Patient believes she had fever last night but none now. Patient using AZO and "one antibiotic" Urgent Care gave her.

## 2017-03-25 NOTE — ED Notes (Signed)
Pt at CT

## 2017-03-25 NOTE — Discharge Instructions (Signed)
Please continue to take the antibiotic Bactrim as prescribed by Urgent Care. Your urine here has been sent for culture.  Sometimes you can call the Urologist office to get on a cancellation list in case a sooner appointment opens up. You can also take one tablet of oxybutrin once daily at bedtime to help with bladder spasms. You can take 600 mg of ibuprofen every 8 hours with food or 650 mg of Tylenol once every 6 hours for pain control.  Please call the office where your colonoscopy is being performed to let them know you were evaluated in the Emergency Department tonight and are being treated for a UTI. Make sure they have no problem with you taking ibuprofen/motrin prior to the procedure.   If you have new or worsening symptoms, fever that does not improve with Tylenol, uncontrollable abdominal or back pain, or if your urine turns cola colored, please return to the emergency department for re-evaluation.

## 2017-03-26 ENCOUNTER — Other Ambulatory Visit: Payer: Self-pay | Admitting: Gynecologic Oncology

## 2017-03-26 DIAGNOSIS — R232 Flushing: Secondary | ICD-10-CM

## 2017-03-27 LAB — URINE CULTURE: Culture: 20000 — AB

## 2017-03-28 ENCOUNTER — Telehealth: Payer: Self-pay | Admitting: Emergency Medicine

## 2017-03-28 NOTE — Telephone Encounter (Signed)
Post ED Visit - Positive Culture Follow-up  Culture report reviewed by antimicrobial stewardship pharmacist:  []  Elenor Quinones, Pharm.D. []  Heide Guile, Pharm.D., BCPS AQ-ID []  Parks Neptune, Pharm.D., BCPS [x]  Alycia Rossetti, Pharm.D., BCPS []  [place], Pharm.D., BCPS, AAHIVP []  Legrand Como, Pharm.D., BCPS, AAHIVP []  Salome Arnt, PharmD, BCPS []  Dimitri Ped, PharmD, BCPS []  Vincenza Hews, PharmD, BCPS  Positive urine culture Treated with sulfamethoxazole-trimethoprim, organism sensitive to the same and no further patient follow-up is required at this time.  Hazle Nordmann 03/28/2017, 11:34 AM

## 2017-04-11 ENCOUNTER — Ambulatory Visit: Payer: BLUE CROSS/BLUE SHIELD | Admitting: Gynecologic Oncology

## 2017-04-20 ENCOUNTER — Ambulatory Visit: Payer: BLUE CROSS/BLUE SHIELD | Admitting: Gynecologic Oncology

## 2017-04-26 ENCOUNTER — Telehealth: Payer: Self-pay | Admitting: *Deleted

## 2017-04-26 NOTE — Telephone Encounter (Signed)
Called and scheduled the patient for a follow up appt for February 20th at 3:45pm.

## 2017-06-13 ENCOUNTER — Encounter: Payer: Self-pay | Admitting: Gynecologic Oncology

## 2017-06-13 ENCOUNTER — Inpatient Hospital Stay: Payer: BLUE CROSS/BLUE SHIELD | Attending: Gynecologic Oncology | Admitting: Gynecologic Oncology

## 2017-06-13 VITALS — BP 144/89 | HR 71 | Temp 98.0°F | Resp 18 | Wt 212.0 lb

## 2017-06-13 DIAGNOSIS — Z8541 Personal history of malignant neoplasm of cervix uteri: Secondary | ICD-10-CM | POA: Insufficient documentation

## 2017-06-13 DIAGNOSIS — C531 Malignant neoplasm of exocervix: Secondary | ICD-10-CM | POA: Diagnosis not present

## 2017-06-13 DIAGNOSIS — B370 Candidal stomatitis: Secondary | ICD-10-CM | POA: Diagnosis not present

## 2017-06-13 DIAGNOSIS — R159 Full incontinence of feces: Secondary | ICD-10-CM | POA: Insufficient documentation

## 2017-06-13 DIAGNOSIS — M549 Dorsalgia, unspecified: Secondary | ICD-10-CM | POA: Diagnosis not present

## 2017-06-13 DIAGNOSIS — R87810 Cervical high risk human papillomavirus (HPV) DNA test positive: Secondary | ICD-10-CM | POA: Insufficient documentation

## 2017-06-13 DIAGNOSIS — R197 Diarrhea, unspecified: Secondary | ICD-10-CM | POA: Insufficient documentation

## 2017-06-13 MED ORDER — FLUCONAZOLE 100 MG PO TABS: 100.0000 mg | ORAL_TABLET | Freq: Every day | ORAL | 0 refills | Status: DC

## 2017-06-13 NOTE — Addendum Note (Signed)
Addended by: Joylene John D on: 06/13/2017 04:26 PM   Modules accepted: Orders

## 2017-06-13 NOTE — Progress Notes (Signed)
GYN ONCOLOGY OFFICE VISIT    Referring MD:  Dr. Oren Binet Burch 47 y.o. female  CC:  Chief Complaint  Patient presents with  . Malignant neoplasm of exocervix [facility])    Assessment/Plan:  [name]  is a 47 y.o.  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  Complete clinical response including on post-treatment restaging PET in October, 2017. No evidence of recurrence.   will see her for follow-up in 3 months.  2/ Oral candidiasis Nystatin swish and swallow with no relief - try diflucan  3/ Diarrhea and fecal incontinence,  - normal colonoscopy (radiation changes), continue supportive care  4/ back pain - concern for occult recurrence. Will order PET scan.   HPI: Lindsey Hughes is a very pleasant 58 year with cervical cancer.  The patient presented with report of  a 4 month history of peristent daily vaginal bleeding. She has also had back pain and more recently random urinary incontinence.  She cannot remember when she last had a pap smear (5-10 years ago) but she does recall having a history of abnormal paps in the past, though was not treated for these and feels that she "was never told they needed treatment".   As workup for her bleeding she saw Dr Pamala Hurry. An office endometrial biopsy was performed on 08/12/15 and revealed benign, weakly proliferative endoemetrium. During that office exam the posterior lip of the cervix was not visualized due to positioning of the uterus and body habitus. Passage of the pipelle was difficult due to obstruction at the endocervix. Pap was taken but was not resulted by the time of her scheduled hysterectomy. It later returned as ASC-H, positive for high risk HPV.  The patient was scheduled for a robotic hysterectomy and on 09/03/15 was taken to the OR. Intraoperative findings were significant for a very abnormal posterior lip of cervix  which was nodular and friable. The cervix could be visualized intra-op under general anesthetic. 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 The procedure was aborted and a referral was made to [place].  PET scan on 09/17/15 showed hypermetabolism of a 5.2cm cervical mass and bilateral external iliac nodes with increased FDG uptake suggestive of metastatic disease. Between 10/04/15 to 11/19/15 she received external beam radiation (45 gy in 25 fractions) with radiosensitizing CDDP. Between 11/09/15- 12/07/15 she received intracavitary brachytherapy (HDR) with 27.5Gy given in 5 fractions. She additionally received a pelvic boost of 9 Gy. She tolerated treatment well with no major GI or GU toxicity.  She did develop a Rt UE DVT for which her port was removed and she was started on lovenox and coumadin.  Post treatment re-staging PET scan was performed on 01/27/16 which showed complete clinical response in the previously enlarged and avid pelvic nodes. No residual cervical mass and residual cervical FDG avidity of 4 demonstrating complete clinical response. No new lesions.   In February 2018 she complained of back pain. PET/CT showed no evidence of malgnancy/recurrence. MRI spine showed some bulging lumbar discs. She had PET avidity in the thyroid gland but negative [place].  Interval Hx: She continues to have diarrhea refractory to stool bulking agents and anti-diarrhea agents. Colonoscopy normal (radiation changes). She has persistent oral candida. She has back pain which is worrisome for recurrent disease.    Current Meds:  Outpatient Encounter Medications as of  06/13/2017  Medication Sig  . fluconazole (DIFLUCAN) 100 MG tablet Take 1 tablet (100 mg total) by mouth daily.  Marland Kitchen levothyroxine (SYNTHROID, LEVOTHROID) 50 MCG tablet Take 50 mcg by mouth daily.   Marland Kitchen oxybutynin (DITROPAN XL) 5 MG 24 hr tablet Take 1  tablet (5 mg total) by mouth at bedtime.  Marland Kitchen PREMPRO 0.625-2.5 MG tablet TAKE 1 TABLET DAILY  . trimethoprim (TRIMPEX) 100 MG tablet   . venlafaxine XR (EFFEXOR-XR) 37.5 MG 24 hr capsule Take 1 capsule daily with breakfast. For hotflashes (Patient taking differently: 75 mg. Take 1 capsule daily with breakfast. For hotflashes)   No facility-administered encounter medications on file as of 06/13/2017.     Allergy:  Allergies  Allergen Reactions  . Zofran [Ondansetron Hcl] Other (See Comments)    headache    Social Hx:   Social History   Socioeconomic History  . Marital status: Married    Spouse name: Not on file  . Number of children: 1  . Years of education: Not on file  . Highest education level: Not on file  Social Needs  . Financial resource strain: Not on file  . Food insecurity - worry: Not on file  . Food insecurity - inability: Not on file  . Transportation needs - medical: Not on file  . Transportation needs - non-medical: Not on file  Occupational History  . Not on file  Tobacco Use  . Smoking status: Never Smoker  . Smokeless tobacco: Never Used  Substance and Sexual Activity  . Alcohol use: No    Alcohol/week: 0.0 oz  . Drug use: No  . Sexual activity: Yes    Birth control/protection: Surgical  Other Topics Concern  . Not on file  Social History Narrative  . Not on file    Past Surgical Hx:  Past Surgical History:  Procedure Laterality Date  . CARPAL TUNNEL RELEASE Bilateral 2002 and 2009  . CESAREAN SECTION  1991   . DILATION AND CURETTAGE OF UTERUS N/A 09/03/2015   Procedure: EXAM UNDER ANESTHESIA  WITH FROZEN SECTION CERVICAL BIOPSY & ENDOCERVICAL CURRETTINGS;  Surgeon: Aloha Gell, MD;  Location: [facility];  Service: Gynecology;  Laterality: N/A;  . ECHO STRESS TEST  12-10-2014   normal w/ no evidence for stress-induced ischemia/  normal LV function and wall motion , ef 65-70%  . IR GENERIC HISTORICAL  10/25/2015   IR RADIOLOGIST EVAL & MGMT 10/25/2015  Corrie Mckusick, DO [facility]  . IR GENERIC HISTORICAL  12/20/2015   IR REMOVAL TUN ACCESS W/ PORT W/O FL MOD SED 12/20/2015 Sandi Mariscal, MD [facility]  . LASIK Left 2015  . PORTACATH PLACEMENT  10-11-2015  . TANDEM RING INSERTION N/A 11/09/2015   Procedure: TANDEM RING INSERTION;  Surgeon: Gery Pray, MD;  Location: [facility];  Service: Urology;  Laterality: N/A;  . TANDEM RING INSERTION N/A 11/16/2015   Procedure: TANDEM RING INSERTION;  Surgeon: Gery Pray, MD;  Location: [facility];  Service: Urology;  Laterality: N/A;  . TANDEM RING INSERTION N/A 11/23/2015   Procedure: TANDEM RING INSERTION;  Surgeon: Gery Pray, MD;  Location[facility];  Service: Urology;  Laterality: N/A;  . TANDEM RING INSERTION N/A 11/30/2015   Procedure: TANDEM RING INSERTION;  Surgeon: Gery Pray, MD;  [facility];  Service: Urology;  Laterality: N/A;  . TANDEM RING INSERTION N/A 12/07/2015   Procedure: TANDEM RING INSERTION;  Surgeon: Gery Pray, MD;  Location: [facility];  Service: Urology;  Laterality: N/A;  . TUBAL LIGATION  1996    Past Medical Hx:  Past Medical History:  Diagnosis Date  . Anemia   . Anticoagulated on Coumadin    started 11-26-2015 for right upper arm deep and superficial vein thrombus  . Cervical cancer [facility]) oncologist-  dr livesay/  dr kinard/  dr Denman George   dx 08/2015-- Stage IIB poorly differentiated SCC--  chemotherapy weekly (started 10-04-2015)  and external radiation (started 10-05-2015) and planned high-dose radiation via tandom ring direct to cervix to start 11-09-2015  . Chemotherapy induced diarrhea   . Chemotherapy-induced nausea   . Deep vein thrombosis, upper right extremity ([facility]) 11-25-2015 dx   deep and superficial involving axillary vein, brachial vein, and basilic vein  . Depression   . Dysuria   . GERD (gastroesophageal reflux disease)    caused by chemotherapy  .  Headache   . History of thrombophlebitis RESOLVED-- but per pt prefer no bp or  puncture done in right arm   per pt symptoms 10-20-2015-- on 10-22-2015 dx Superficial thrombus RUE of right basilic vein,where IV access was difficult for first peripheral chemo (not deep venous system) / symptoms resolved with local measures  . Hypothyroidism   . Port-A-Cath in place   . Radiation    currently having radiation therapy started 10-04-2015  . Radiation 10/04/15-11/19/15   pelvis 45 Gy, boost to 9 Gy  . Radiation 11/09/15-12/07/15   bracytherapy cervical - 27.5 Gy in 5 fractions  . Superficial thrombophlebitis of arm 11/25/2015   right upper arm-- secondary to IV access difficult for first chemo    Past Gynecological History:  G1P1, + abnormal pap smears remotely. No recent pap history in 5-10 years.  No LMP recorded. Patient is not currently having periods (Reason: Chemotherapy).  Family Hx:  Family History  Problem Relation Age of Onset  . CAD Mother        Premature disease  . Lung cancer Mother   . Lung cancer Father   . Brain cancer Father   . CAD Brother        MI in his 32s    Review of Systems:  Constitutional  + hot flashes  ENT Normal appearing ears and nares bilaterally Cardiovascular  No chest pain, shortness of breath, or edema  Pulmonary  No cough or wheeze.  Gastro Intestinal  No nausea, vomitting, or diarrhoea. No bright red blood per rectum, no abdominal pain, change in bowel movement, or constipation. + fecal incontinence and loose stools x 12/day.  Diarrhea precipitated  by gustation. Genito Urinary  No frequency, urgency, dysuria, no bleeding Musculo Skeletal  No myalgia, arthralgia, Neurologic  No weakness, numbness, change in gait,  Psychology  No depression, anxiety, insomnia.   Vitals:  Blood pressure (!) 144/89, pulse 71, temperature 98 F (36.7 C), temperature source Oral, resp. rate 18, weight 212 lb (96.2 kg), SpO2 100 %.  Physical Exam: WD in  NAD Neck  Supple NROM, without any enlargements. Oral candidiasis noted. Lymph Node Survey No cervical supraclavicular or inguinal adenopathy Cardiovascular  Pulse normal rate, regularity and rhythm. S1 and S2 normal.  Lungs  Clear to auscultation bilateraly, without wheezes/crackles/rhonchi. Good air movement.  Skin  No rash/lesions/breakdown  Psychiatry  Alert and oriented to person, place, and time  Abdomen  Normoactive bowel sounds, abdomen soft, non-tender and obese without evidence of hernia.  Back No CVA tenderness Genito Urinary  Vulva/vagina: Normal external female genitalia.  No  lesions. No discharge or bleeding.  Bladder/urethra:  No lesions or masses, well supported bladder  Vagina: normal with no lesions  Cervix:Cervix flush with the vaginal vault.  NO lesions, no parmetrial nodularity No palpable or visible tumor.  Uterus: 10cm minimally mobile,   Adnexa: no palpable masses. Rectal  Good tone, no masses no cul de sac nodularity. No blood on glove. No parametrial extension minimally bilateraly Extremities  No bilateral cyanosis, clubbing or edema.   Donaciano Eva, MD  06/13/2017, 4:21 PM

## 2017-06-13 NOTE — Patient Instructions (Signed)
Please notify Dr Denman George at phone number [phone] if you notice vaginal bleeding, new pelvic or abdominal pains, bloating, feeling full easy, or a change in bladder or bowel function.   Please return to see Dr Denman George in 3 months.  Dr Denman George has scheduled you for a PET scan, her office will contact you regarding the timing of this.   Please take 1 table of diflucan for your symptoms of oral thrush.

## 2017-06-14 ENCOUNTER — Telehealth: Payer: Self-pay | Admitting: *Deleted

## 2017-06-14 NOTE — Telephone Encounter (Signed)
Scheduled the patient for the PET scan.  Appt scheduled for February 26th at 4pm, arrive at 3:30pm. Called and left the patient a message to call the office back regarding her appt. (Patient will need to be NPO 6hrs prior)

## 2017-06-14 NOTE — Telephone Encounter (Signed)
Patient called back and given the appt for February 26th at 4pm , along with the instructions

## 2017-06-19 ENCOUNTER — Ambulatory Visit (HOSPITAL_COMMUNITY)
Admission: RE | Admit: 2017-06-19 | Discharge: 2017-06-19 | Disposition: A | Discharge status: Home / Self Care | Payer: BLUE CROSS/BLUE SHIELD | Source: Ambulatory Visit | Attending: Gynecologic Oncology | Admitting: Gynecologic Oncology

## 2017-06-19 DIAGNOSIS — C531 Malignant neoplasm of exocervix: Secondary | ICD-10-CM | POA: Diagnosis present

## 2017-06-19 LAB — GLUCOSE, CAPILLARY: Glucose-Capillary: 93 mg/dL (ref 65–99)

## 2017-06-19 MED ORDER — FLUDEOXYGLUCOSE F - 18 (FDG) INJECTION
11.4000 | Freq: Once | INTRAVENOUS | Status: AC | PRN
  Administered 2017-06-19: 11.4 via INTRAVENOUS

## 2017-06-20 ENCOUNTER — Telehealth: Payer: Self-pay

## 2017-06-20 NOTE — Telephone Encounter (Signed)
Told Lindsey Hughes the result of PET scan as noted below by Joylene John, NP.

## 2017-06-20 NOTE — Telephone Encounter (Signed)
-----   Message from Dorothyann Gibbs, NP sent at 06/20/2017  9:10 AM EST ----- Please let her know PET looks good.  Thank you ----- Message ----- From: Interface, Rad Results In Sent: 06/20/2017   8:27 AM To: Dorothyann Gibbs, NP

## 2017-08-21 ENCOUNTER — Telehealth: Payer: Self-pay | Admitting: *Deleted

## 2017-08-21 ENCOUNTER — Telehealth: Payer: Self-pay

## 2017-08-21 NOTE — Telephone Encounter (Signed)
Call received from [facility], I need to speak with Dr. Mariana Kaufman nurse.  Oh no, Dr. Serita Grit nurse in reference to neuropathy.  Call transferred to GYN Oncology.

## 2017-08-21 NOTE — Telephone Encounter (Signed)
Lindsey Hughes states that she is experiencing burning, sharp shooting pains, and  achiness  in her right foot.  Her left 3rd and 4th toe has sharp shooting pains and burning. Pt able to bear weight on feet and legs. No redness or swelling in her calves PCP said to talk with Dr. 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 Pt received 6 cycles of Cisplatin Chemotherapy with radiation.in 2017.  No significant residual neuropathy note in office not 02-28-16 by medical oncologist Dr. Marko Plume. Elbows are tender to touch.   Her body is tender to touch.  Onset ~ 2 months. Reviewed with Melissa Cross,NP. She suggested that she return to her PCP for further evaluation. Lindsey

## 2017-09-12 ENCOUNTER — Inpatient Hospital Stay: Payer: BLUE CROSS/BLUE SHIELD | Attending: Gynecologic Oncology | Admitting: Gynecologic Oncology

## 2018-02-08 ENCOUNTER — Telehealth: Payer: Self-pay | Admitting: *Deleted

## 2018-02-08 NOTE — Telephone Encounter (Signed)
Returned the patient call and scheduled an appt for Nov 6th

## 2018-02-27 ENCOUNTER — Encounter: Payer: Self-pay | Admitting: Gynecologic Oncology

## 2018-02-27 ENCOUNTER — Inpatient Hospital Stay: Payer: BLUE CROSS/BLUE SHIELD | Attending: Gynecologic Oncology | Admitting: Gynecologic Oncology

## 2018-02-27 ENCOUNTER — Other Ambulatory Visit (HOSPITAL_COMMUNITY)
Admission: RE | Admit: 2018-02-27 | Discharge: 2018-02-27 | Disposition: A | Discharge status: Home / Self Care | Payer: BLUE CROSS/BLUE SHIELD | Source: Ambulatory Visit | Attending: Gynecologic Oncology | Admitting: Gynecologic Oncology

## 2018-02-27 VITALS — BP 140/87 | HR 74 | Temp 98.4°F | Resp 20 | Ht 62.0 in | Wt 219.7 lb

## 2018-02-27 DIAGNOSIS — Z9221 Personal history of antineoplastic chemotherapy: Secondary | ICD-10-CM

## 2018-02-27 DIAGNOSIS — Z923 Personal history of irradiation: Secondary | ICD-10-CM

## 2018-02-27 DIAGNOSIS — Z8541 Personal history of malignant neoplasm of cervix uteri: Secondary | ICD-10-CM

## 2018-02-27 DIAGNOSIS — C531 Malignant neoplasm of exocervix: Secondary | ICD-10-CM

## 2018-02-27 DIAGNOSIS — Z08 Encounter for follow-up examination after completed treatment for malignant neoplasm: Secondary | ICD-10-CM | POA: Diagnosis not present

## 2018-02-27 NOTE — Patient Instructions (Addendum)
Please notify Dr Denman George at phone number [phone] if you notice vaginal bleeding, new pelvic or abdominal pains, bloating, feeling full easy, or a change in bladder or bowel function.   Please return to see Dr Denman George in April, 2020 as scheduled.  Dr Denman George ordered a pap test today. Her office will call you with the result in 7-14days.

## 2018-02-27 NOTE — Progress Notes (Signed)
GYN ONCOLOGY OFFICE VISIT    Referring MD:  Dr. Oren Binet Hughes 47 y.o. female  CC:  Chief Complaint  Patient presents with  . Malignant neoplasm of exocervix [facility])    Assessment/Plan:  [name]  is a 47 y.o.  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  Complete clinical response including on post-treatment restaging PET in October, 2017. No evidence of recurrence.  1/ Hx of cervical cancer   will see her for follow-up in 6 months. Follow-up pap results from today. If normal, recommend repeat in November, 2020.   2/Diarrhea and fecal incontinence,  - normal colonoscopy (radiation changes), continue supportive care   HPI: Lindsey Hughes is a very pleasant 76 year with cervical cancer.  The patient presented with report of  a 4 month history of peristent daily vaginal bleeding. She has also had back pain and more recently random urinary incontinence.  She cannot remember when she last had a pap smear (5-10 years ago) but she does recall having a history of abnormal paps in the past, though was not treated for these and feels that she "was never told they needed treatment".   As workup for her bleeding she saw Dr Lindsey Hughes. An office endometrial biopsy was performed on 08/12/15 and revealed benign, weakly proliferative endoemetrium. During that office exam the posterior lip of the cervix was not visualized due to positioning of the uterus and body habitus. Passage of the pipelle was difficult due to obstruction at the endocervix. Pap was taken but was not resulted by the time of her scheduled hysterectomy. It later returned as ASC-H, positive for high risk HPV.  The patient was scheduled for a robotic hysterectomy and on 09/03/15 was taken to the OR. Intraoperative findings were significant for a very abnormal posterior lip of cervix which was nodular and friable.  The cervix could be visualized intra-op under general anesthetic. Because it was clearly abnormal she underwent biopsy by Dr Lindsey Hughes which returned positive for poorly differentiated squamous cell carcinoma. The procedure was aborted and a referral was made to [facility].  PET scan on 09/17/15 showed hypermetabolism of a 5.2cm cervical mass and bilateral external iliac nodes with increased FDG uptake suggestive of metastatic disease. Between 10/04/15 to 11/19/15 she received external beam radiation (45 gy in 25 fractions) with radiosensitizing CDDP. Between 11/09/15- 12/07/15 she received intracavitary brachytherapy (HDR) with 27.5Gy given in 5 fractions. She additionally received a pelvic boost of 9 Gy. She tolerated treatment well with no major GI or GU toxicity.  She did develop a Rt UE DVT for which her port was removed and she was started on lovenox and coumadin.  Post treatment re-staging PET scan was performed on 01/27/16 which showed complete clinical response in the previously enlarged and avid pelvic nodes. No residual cervical mass and residual cervical FDG avidity of 4 demonstrating complete clinical response. No new lesions.   In February 2018 she complained of back pain. PET/CT showed no evidence of malgnancy/recurrence. MRI spine showed some bulging lumbar discs. She had PET avidity in the thyroid gland but negative [place].  In February, 2019 she developed back pain again. Repeat PET/Ct showed no evidence of recurrence.   Interval Hx: She continues to have diarrhea refractory to stool bulking agents and anti-diarrhea agents. Colonoscopy normal (radiation changes).  Her back pain is stable and not worrisome to her. She is sexually active though  with some discomfort.  Current Meds:  Outpatient Encounter Medications as of 02/27/2018  Medication Sig  . levothyroxine (SYNTHROID, LEVOTHROID) 50 MCG tablet Take 50 mcg by mouth daily.   Marland Kitchen PREMPRO 0.625-2.5 MG tablet TAKE 1 TABLET DAILY  .  trimethoprim (TRIMPEX) 100 MG tablet   . venlafaxine XR (EFFEXOR-XR) 37.5 MG 24 hr capsule Take 1 capsule daily with breakfast. For hotflashes (Patient taking differently: 75 mg. Take 1 capsule daily with breakfast. For hotflashes)  . [DISCONTINUED] fluconazole (DIFLUCAN) 100 MG tablet Take 1 tablet (100 mg total) by mouth daily. (Patient not taking: Reported on 02/27/2018)  . [DISCONTINUED] oxybutynin (DITROPAN XL) 5 MG 24 hr tablet Take 1 tablet (5 mg total) by mouth at bedtime. (Patient not taking: Reported on 02/27/2018)   No facility-administered encounter medications on file as of 02/27/2018.     Allergy:  Allergies  Allergen Reactions  . Zofran [Ondansetron Hcl] Other (See Comments)    headache    Social Hx:   Social History   Socioeconomic History  . Marital status: Married    Spouse name: Not on file  . Number of children: 1  . Years of education: Not on file  . Highest education level: Not on file  Occupational History  . Not on file  Social Needs  . Financial resource strain: Not on file  . Food insecurity:    Worry: Not on file    Inability: Not on file  . Transportation needs:    Medical: Not on file    Non-medical: Not on file  Tobacco Use  . Smoking status: Never Smoker  . Smokeless tobacco: Never Used  Substance and Sexual Activity  . Alcohol use: No    Alcohol/week: 0.0 standard drinks  . Drug use: No  . Sexual activity: Yes    Birth control/protection: Surgical  Lifestyle  . Physical activity:    Days per week: Not on file    Minutes per session: Not on file  . Stress: Not on file  Relationships  . Social connections:    Talks on phone: Not on file    Gets together: Not on file    Attends religious service: Not on file    Active member of club or organization: Not on file    Attends meetings of clubs or organizations: Not on file    Relationship status: Not on file  . Intimate partner violence:    Fear of current or ex partner: Not on file     Emotionally abused: Not on file    Physically abused: Not on file    Forced sexual activity: Not on file  Other Topics Concern  . Not on file  Social History Narrative  . Not on file    Past Surgical Hx:  Past Surgical History:  Procedure Laterality Date  . CARPAL TUNNEL RELEASE Bilateral 2002 and 2009  . CESAREAN SECTION  1991   . DILATION AND CURETTAGE OF UTERUS N/A 09/03/2015   Procedure: EXAM UNDER ANESTHESIA  WITH FROZEN SECTION CERVICAL BIOPSY & ENDOCERVICAL CURRETTINGS;  Surgeon: Aloha Gell, MD;  Location: [facility];  Service: Gynecology;  Laterality: N/A;  . ECHO STRESS TEST  12-10-2014   normal w/ no evidence for stress-induced ischemia/  normal LV function and wall motion , ef 65-70%  . IR GENERIC HISTORICAL  10/25/2015   IR RADIOLOGIST EVAL & MGMT 10/25/2015 Corrie Mckusick, DO [facility]  . IR GENERIC HISTORICAL  12/20/2015   IR REMOVAL TUN ACCESS W/ PORT  W/O FL MOD SED 12/20/2015 Sandi Mariscal, MD [facility]  . LASIK Left 2015  . PORTACATH PLACEMENT  10-11-2015  . TANDEM RING INSERTION N/A 11/09/2015   Procedure: TANDEM RING INSERTION;  Surgeon: Gery Pray, MD;  Location: [facility];  Service: Urology;  Laterality: N/A;  . TANDEM RING INSERTION N/A 11/16/2015   Procedure: TANDEM RING INSERTION;  Surgeon: Gery Pray, MD;  Location: [facility];  Service: Urology;  Laterality: N/A;  . TANDEM RING INSERTION N/A 11/23/2015   Procedure: TANDEM RING INSERTION;  Surgeon: Gery Pray, MD;  Location: [facility];  Service: Urology;  Laterality: N/A;  . TANDEM RING INSERTION N/A 11/30/2015   Procedure: TANDEM RING INSERTION;  Surgeon: Gery Pray, MD;  Location: [facility];  Service: Urology;  Laterality: N/A;  . TANDEM RING INSERTION N/A 12/07/2015   Procedure: TANDEM RING INSERTION;  Surgeon: Gery Pray, MD;  Location: [facility];  Service: Urology;  Laterality: N/A;  . TUBAL LIGATION  1996    Past  Medical Hx:  Past Medical History:  Diagnosis Date  . Anemia   . Anticoagulated on Coumadin    started 11-26-2015 for right upper arm deep and superficial vein thrombus  . Cervical cancer [facility]) oncologist-  dr livesay/  dr kinard/  dr Denman George   dx 08/2015-- Stage IIB poorly differentiated SCC--  chemotherapy weekly (started 10-04-2015)  and external radiation (started 10-05-2015) and planned high-dose radiation via tandom ring direct to cervix to start 11-09-2015  . Chemotherapy induced diarrhea   . Chemotherapy-induced nausea   . Deep vein thrombosis, upper right extremity ([place]) 11-25-2015 dx   deep and superficial involving axillary vein, brachial vein, and basilic vein  . Depression   . Dysuria   . GERD (gastroesophageal reflux disease)    caused by chemotherapy  . Headache   . History of thrombophlebitis RESOLVED-- but per pt prefer no bp or  puncture done in right arm   per pt symptoms 10-20-2015-- on 10-22-2015 dx Superficial thrombus RUE of right basilic vein,where IV access was difficult for first peripheral chemo (not deep venous system) / symptoms resolved with local measures  . Hypothyroidism   . Port-A-Cath in place   . Radiation    currently having radiation therapy started 10-04-2015  . Radiation 10/04/15-11/19/15   pelvis 45 Gy, boost to 9 Gy  . Radiation 11/09/15-12/07/15   bracytherapy cervical - 27.5 Gy in 5 fractions  . Superficial thrombophlebitis of arm 11/25/2015   right upper arm-- secondary to IV access difficult for first chemo    Past Gynecological History:  G1P1, + abnormal pap smears remotely. No recent pap history in 5-10 years.  No LMP recorded. (Menstrual status: Chemotherapy).  Family Hx:  Family History  Problem Relation Age of Onset  . CAD Mother        Premature disease  . Lung cancer Mother   . Lung cancer Father   . Brain cancer Father   . CAD Brother        MI in his 36s    Review of Systems:  Constitutional  + hot flashes  ENT Normal  appearing ears and nares bilaterally Cardiovascular  No chest pain, shortness of breath, or edema  Pulmonary  No cough or wheeze.  Gastro Intestinal  No nausea, vomitting, or diarrhoea. No bright red blood per rectum, no abdominal pain, change in bowel movement, or constipation. + fecal incontinence and loose stools x 12/day.  Diarrhea  precipitated  by gustation. Genito Urinary  No frequency, urgency, dysuria, no bleeding Musculo Skeletal  No myalgia, arthralgia, Neurologic  No weakness, numbness, change in gait,  Psychology  No depression, anxiety, insomnia.   Vitals:  Blood pressure 140/87, pulse 74, temperature 98.4 F (36.9 C), temperature source Oral, resp. rate 20, height 5\' 2"  (1.575 m), weight 219 lb 11.2 oz (99.7 kg), SpO2 100 %.  Physical Exam: WD in NAD Neck  Supple NROM, without any enlargements. Oral candidiasis noted. Lymph Node Survey No cervical supraclavicular or inguinal adenopathy Cardiovascular  Pulse normal rate, regularity and rhythm. S1 and S2 normal.  Lungs  Clear to auscultation bilateraly, without wheezes/crackles/rhonchi. Good air movement.  Skin  No rash/lesions/breakdown  Psychiatry  Alert and oriented to person, place, and time  Abdomen  Normoactive bowel sounds, abdomen soft, non-tender and obese without evidence of hernia.  Back No CVA tenderness Genito Urinary  Vulva/vagina: Normal external female genitalia.  No lesions. No discharge or bleeding.  Bladder/urethra:  No lesions or masses, well supported bladder  Vagina: normal with no lesions  Cervix:Cervix flush with the vaginal vault.  NO lesions, no parmetrial nodularity No palpable or visible tumor. Pap taken.   Uterus: 10cm minimally mobile,   Adnexa: no palpable masses. Rectal  Good tone, no masses no cul de sac nodularity. No blood on glove. No parametrial extension minimally bilateraly Extremities  No bilateral cyanosis, clubbing or edema.   Thereasa Solo, MD  02/27/2018, 12:03  PM

## 2018-03-02 ENCOUNTER — Emergency Department (HOSPITAL_COMMUNITY)
Admission: EM | Admit: 2018-03-02 | Discharge: 2018-03-02 | Disposition: A | Discharge status: Home / Self Care | Payer: BLUE CROSS/BLUE SHIELD | Attending: Emergency Medicine | Admitting: Emergency Medicine

## 2018-03-02 ENCOUNTER — Encounter (HOSPITAL_COMMUNITY): Payer: Self-pay | Admitting: Emergency Medicine

## 2018-03-02 ENCOUNTER — Emergency Department (HOSPITAL_BASED_OUTPATIENT_CLINIC_OR_DEPARTMENT_OTHER)
Admission: EM | Admit: 2018-03-02 | Discharge: 2018-03-02 | Disposition: A | Discharge status: Home / Self Care | Payer: BLUE CROSS/BLUE SHIELD | Source: Home / Self Care | Attending: Emergency Medicine | Admitting: Emergency Medicine

## 2018-03-02 ENCOUNTER — Other Ambulatory Visit: Payer: Self-pay

## 2018-03-02 ENCOUNTER — Emergency Department (HOSPITAL_BASED_OUTPATIENT_CLINIC_OR_DEPARTMENT_OTHER): Payer: BLUE CROSS/BLUE SHIELD

## 2018-03-02 ENCOUNTER — Encounter (HOSPITAL_BASED_OUTPATIENT_CLINIC_OR_DEPARTMENT_OTHER): Payer: Self-pay | Admitting: *Deleted

## 2018-03-02 DIAGNOSIS — E039 Hypothyroidism, unspecified: Secondary | ICD-10-CM | POA: Diagnosis not present

## 2018-03-02 DIAGNOSIS — Z8541 Personal history of malignant neoplasm of cervix uteri: Secondary | ICD-10-CM | POA: Insufficient documentation

## 2018-03-02 DIAGNOSIS — Z79899 Other long term (current) drug therapy: Secondary | ICD-10-CM | POA: Insufficient documentation

## 2018-03-02 DIAGNOSIS — I82411 Acute embolism and thrombosis of right femoral vein: Secondary | ICD-10-CM

## 2018-03-02 DIAGNOSIS — M79604 Pain in right leg: Secondary | ICD-10-CM | POA: Insufficient documentation

## 2018-03-02 LAB — COMPREHENSIVE METABOLIC PANEL
ALT: 15 U/L (ref 0–44)
AST: 16 U/L (ref 15–41)
Albumin: 3.4 g/dL — ABNORMAL LOW (ref 3.5–5.0)
Alkaline Phosphatase: 104 U/L (ref 38–126)
Anion gap: 10 (ref 5–15)
BILIRUBIN TOTAL: 0.5 mg/dL (ref 0.3–1.2)
BUN: 11 mg/dL (ref 6–20)
CALCIUM: 9 mg/dL (ref 8.9–10.3)
CHLORIDE: 101 mmol/L (ref 98–111)
CO2: 25 mmol/L (ref 22–32)
CREATININE: 0.8 mg/dL (ref 0.44–1.00)
Glucose, Bld: 103 mg/dL — ABNORMAL HIGH (ref 70–99)
Potassium: 3.6 mmol/L (ref 3.5–5.1)
Sodium: 136 mmol/L (ref 135–145)
TOTAL PROTEIN: 7.8 g/dL (ref 6.5–8.1)

## 2018-03-02 LAB — CBC WITH DIFFERENTIAL/PLATELET
ABS IMMATURE GRANULOCYTES: 0.02 10*3/uL (ref 0.00–0.07)
BASOS PCT: 0 %
Basophils Absolute: 0 10*3/uL (ref 0.0–0.1)
Eosinophils Absolute: 0.2 10*3/uL (ref 0.0–0.5)
Eosinophils Relative: 2 %
HEMATOCRIT: 37.7 % (ref 36.0–46.0)
Hemoglobin: 11.6 g/dL — ABNORMAL LOW (ref 12.0–15.0)
Immature Granulocytes: 0 %
LYMPHS ABS: 0.6 10*3/uL — AB (ref 0.7–4.0)
Lymphocytes Relative: 8 %
MCH: 26.9 pg (ref 26.0–34.0)
MCHC: 30.8 g/dL (ref 30.0–36.0)
MCV: 87.5 fL (ref 80.0–100.0)
MONO ABS: 0.6 10*3/uL (ref 0.1–1.0)
MONOS PCT: 8 %
NEUTROS ABS: 6.3 10*3/uL (ref 1.7–7.7)
NEUTROS PCT: 82 %
PLATELETS: 275 10*3/uL (ref 150–400)
RBC: 4.31 MIL/uL (ref 3.87–5.11)
RDW: 13 % (ref 11.5–15.5)
WBC: 7.7 10*3/uL (ref 4.0–10.5)
nRBC: 0 % (ref 0.0–0.2)

## 2018-03-02 MED ORDER — HYDROCODONE-ACETAMINOPHEN 5-325 MG PO TABS
1.0000 | ORAL_TABLET | Freq: Once | ORAL | Status: AC
  Administered 2018-03-02: 1 via ORAL
  Filled 2018-03-02: qty 1

## 2018-03-02 MED ORDER — APIXABAN (ELIQUIS) EDUCATION KIT FOR DVT/PE PATIENTS: 1.0000 | PACK | Freq: Once | 0 refills | Status: AC

## 2018-03-02 MED ORDER — APIXABAN 2.5 MG PO TABS
10.0000 mg | ORAL_TABLET | Freq: Once | ORAL | Status: AC
  Administered 2018-03-02: 10 mg via ORAL
  Filled 2018-03-02: qty 4

## 2018-03-02 MED ORDER — ELIQUIS 5 MG VTE STARTER PACK: ORAL_TABLET | ORAL | 0 refills | Status: DC

## 2018-03-02 NOTE — ED Provider Notes (Signed)
[place] EMERGENCY DEPARTMENT Provider Note   CSN: [id number] Arrival date & time: 03/02/18  1707     History   Chief Complaint Chief Complaint  Patient presents with  . Leg Pain    HPI Lindsey Hughes is a 47 y.o. female.  Lindsey Hughes is a 47 y.o. Female history of cervical cancer, currently in remission after treatment, upper extremity DVT, GERD, who presents to the ED for evaluation of pain and swelling in the right lower leg.  She reports pain started about 5 days ago on the inside of her right thigh and she did not think much of it but over the last few days she has had worsening pain and swelling in the calf seems warm to the touch.  She reports symptoms feel similar to the previous DVT she had in her arms, she has been on Lovenox and warfarin in the past, but is not currently on any blood thinners.  She was seen at [facility] earlier today and told she would need an ultrasound but they did not have ultrasound available, they recommended giving a dose of blood thinner and having her come back in the morning but she chose to seek evaluation elsewhere because she wanted to know what was causing this discomfort.  She reports that her leg feels extremely tight like it might pop.  No redness, fevers or chills.  No recent trauma or injury to the leg.  She denies any chest pain, shortness of breath or pain when she takes a deep breath.     Past Medical History:  Diagnosis Date  . Anemia   . Anticoagulated on Coumadin    started 11-26-2015 for right upper arm deep and superficial vein thrombus  . Cervical cancer [facility]) oncologist-  dr livesay/  dr kinard/  dr Denman George   dx 08/2015-- Stage IIB poorly differentiated SCC--  chemotherapy weekly (started 10-04-2015)  and external radiation (started 10-05-2015) and planned high-dose radiation via tandom ring direct to cervix to start 11-09-2015  . Chemotherapy induced diarrhea   . Chemotherapy-induced nausea   . Deep vein  thrombosis, upper right extremity ([place]) 11-25-2015 dx   deep and superficial involving axillary vein, brachial vein, and basilic vein  . Depression   . Dysuria   . GERD (gastroesophageal reflux disease)    caused by chemotherapy  . Headache   . History of thrombophlebitis RESOLVED-- but per pt prefer no bp or  puncture done in right arm   per pt symptoms 10-20-2015-- on 10-22-2015 dx Superficial thrombus RUE of right basilic vein,where IV access was difficult for first peripheral chemo (not deep venous system) / symptoms resolved with local measures  . Hypothyroidism   . Port-A-Cath in place   . Radiation    currently having radiation therapy started 10-04-2015  . Radiation 10/04/15-11/19/15   pelvis 45 Gy, boost to 9 Gy  . Radiation 11/09/15-12/07/15   bracytherapy cervical - 27.5 Gy in 5 fractions  . Superficial thrombophlebitis of arm 11/25/2015   right upper arm-- secondary to IV access difficult for first chemo    Patient Active Problem List   Diagnosis Date Noted  . Diarrhea 01/18/2017  . 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 Back pain 10/11/2016  . Premature menopause 12/24/2015  . Long term current use of anticoagulant therapy 12/06/2015  . Sensation of lump in throat 12/06/2015  . Hypokalemia 12/06/2015  . UTI (lower urinary tract infection) 11/26/2015  . 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 Cervical cancer, FIGO stage IIB (HCC)   . Superficial thrombophlebitis of arm 10/31/2015  . Portacath in place 10/31/2015  . History of bilateral tubal ligation 09/25/2015  . Vaginal bleeding, abnormal 09/25/2015  . Thyroid activity decreased 09/25/2015  . Urinary frequency 09/25/2015  . 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 Obesity (BMI 35.0-39.9 without comorbidity) 09/16/2015    Past Surgical History:  Procedure Laterality Date  . CARPAL TUNNEL RELEASE Bilateral 2002 and 2009  . CESAREAN SECTION  1991   . DILATION AND  CURETTAGE OF UTERUS N/A 09/03/2015   Procedure: EXAM UNDER ANESTHESIA  WITH FROZEN SECTION CERVICAL BIOPSY & ENDOCERVICAL CURRETTINGS;  Surgeon: Aloha Gell, MD;  Location: [facility];  Service: Gynecology;  Laterality: N/A;  . ECHO STRESS TEST  12-10-2014   normal w/ no evidence for stress-induced ischemia/  normal LV function and wall motion , ef 65-70%  . IR GENERIC HISTORICAL  10/25/2015   IR RADIOLOGIST EVAL & MGMT 10/25/2015 Corrie Mckusick, DO [facility]  . IR GENERIC HISTORICAL  12/20/2015   IR REMOVAL TUN ACCESS W/ PORT W/O FL MOD SED 12/20/2015 Sandi Mariscal, MD [facility]  . LASIK Left 2015  . PORTACATH PLACEMENT  10-11-2015  . TANDEM RING INSERTION N/A 11/09/2015   Procedure: TANDEM RING INSERTION;  Surgeon: Gery Pray, MD;  Location: [facility];  Service: Urology;  Laterality: N/A;  . TANDEM RING INSERTION N/A 11/16/2015   Procedure: TANDEM RING INSERTION;  Surgeon: Gery Pray, MD;  Location: [facility];  Service: Urology;  Laterality: N/A;  . TANDEM RING INSERTION N/A 11/23/2015   Procedure: TANDEM RING INSERTION;  Surgeon: Gery Pray, MD;  Location: [facility];  Service: Urology;  Laterality: N/A;  . TANDEM RING INSERTION N/A 11/30/2015   Procedure: TANDEM RING INSERTION;  Surgeon: Gery Pray, MD;  Location: [facility];  Service: Urology;  Laterality: N/A;  . TANDEM RING INSERTION N/A 12/07/2015   Procedure: TANDEM RING INSERTION;  Surgeon: Gery Pray, MD;  Location: [facility];  Service: Urology;  Laterality: N/A;  . TUBAL LIGATION  1996     OB History   None      Home Medications    Prior to Admission medications   Medication Sig Start Date End Date Taking? Authorizing Provider  apixaban (ELIQUIS) KIT 1 kit by Does not apply route once for 1 dose. 03/02/18 03/02/18  Jacqlyn Larsen, PA-C  ELIQUIS STARTER PACK (ELIQUIS STARTER PACK) 5 MG TABS Take as directed on package: start with two-38m  tablets twice daily for 7 days. On day 8, switch to one-524mtablet twice daily. 03/02/18   FoJacqlyn LarsenPA-C  levothyroxine (SYNTHROID, LEVOTHROID) 50 MCG tablet Take 50 mcg by mouth daily.  03/30/16   [provider]  PREMPRO 0.625-2.5 MG tablet TAKE 1 TABLET DAILY 03/28/17   RoEveritt AmberMD  trimethoprim (TRIMPEX) 100 MG tablet  05/26/17   [provider]  venlafaxine XR (EFFEXOR-XR) 37.5 MG 24 hr capsule Take 1 capsule daily with breakfast. For hotflashes Patient taking differently: 75 mg. Take 1 capsule daily with breakfast. For hotflashes 06/07/16   RoEveritt AmberMD    Family History Family History  Problem Relation Age of Onset  . CAD Mother        Premature disease  . Lung cancer Mother   . Lung cancer Father   . Brain cancer Father   . CAD Brother  MI in his 19s    Social History Social History   Tobacco Use  . Smoking status: Never Smoker  . Smokeless tobacco: Never Used  Substance Use Topics  . Alcohol use: No    Alcohol/week: 0.0 standard drinks  . Drug use: No     Allergies   Zofran [ondansetron hcl]   Review of Systems Review of Systems  Constitutional: Negative for chills and fever.  HENT: Negative.   Respiratory: Negative for cough and shortness of breath.   Cardiovascular: Positive for leg swelling. Negative for chest pain.  Gastrointestinal: Negative for abdominal pain, nausea and vomiting.  Skin: Negative for color change, rash and wound.  Neurological: Negative for weakness and numbness.  All other systems reviewed and are negative.    Physical Exam Updated Vital Signs BP 131/86   Pulse 100   Temp 98.3 F (36.8 C) (Oral)   Resp 18   Ht 5' 2"  (1.575 m)   Wt 97.5 kg   SpO2 100%   BMI 39.31 kg/m   Physical Exam  Constitutional: She appears well-developed and well-nourished. No distress.  HENT:  Head: Normocephalic and atraumatic.  Eyes: Right eye exhibits no discharge. Left eye exhibits no discharge.  Neck: Neck  supple.  Cardiovascular: Normal rate, regular rhythm, normal heart sounds and intact distal pulses. Exam reveals no gallop and no friction rub.  No murmur heard. Pulmonary/Chest: Effort normal and breath sounds normal. No respiratory distress.  Respirations equal and unlabored, patient able to speak in full sentences, lungs clear to auscultation bilaterally  Abdominal: Soft. Bowel sounds are normal. She exhibits no distension and no mass. There is no tenderness. There is no guarding.  Abdomen soft, nondistended, nontender to palpation in all quadrants without guarding or peritoneal signs  Musculoskeletal:  Right lower extremity with 2+ edema up to the level of the knee, leg is warm to the touch without overlying erythema. Mild tenderness to palpation throughout. No swelling or tenderness over the left lower extremity.  Neurological: She is alert. Coordination normal.  Skin: Skin is warm and dry. Capillary refill takes less than 2 seconds. She is not diaphoretic.  Psychiatric: She has a normal mood and affect. Her behavior is normal.  Nursing note and vitals reviewed.    ED Treatments / Results  Labs (all labs ordered are listed, but only abnormal results are displayed) Labs Reviewed - No data to display  EKG None  Radiology US Venous Img Lower Right (dvt Study)  Result Date: 03/02/2018 CLINICAL DATA:  Acute right leg pain and swelling. EXAM: Right LOWER EXTREMITY VENOUS DOPPLER ULTRASOUND TECHNIQUE: Gray-scale sonography with graded compression, as well as color Doppler and duplex ultrasound were performed to evaluate the lower extremity deep venous systems from the level of the common femoral vein and including the common femoral, femoral, profunda femoral, popliteal and calf veins including the posterior tibial, peroneal and gastrocnemius veins when visible. The superficial great saphenous vein was also interrogated. Spectral Doppler was utilized to evaluate flow at rest and with distal  augmentation maneuvers in the common femoral, femoral and popliteal veins. COMPARISON:  None. FINDINGS: Contralateral Common Femoral Vein: Respiratory phasicity is normal and symmetric with the symptomatic side. No evidence of thrombus. Normal compressibility. Common Femoral Vein: No evidence of thrombus. Normal compressibility, respiratory phasicity and response to augmentation. Saphenofemoral Junction: No evidence of thrombus. Normal compressibility and flow on color Doppler imaging. Profunda Femoral Vein: No evidence of thrombus. Normal compressibility and flow on color Doppler imaging. Femoral Vein:  Noncompressible with no flow consistent with occlusive thrombus. Popliteal Vein: Noncompressible with no flow consistent with occlusive thrombus. Calf Veins: Noncompressible with no flow consistent with occlusive thrombus. Superficial Great Saphenous Vein: No evidence of thrombus. Normal compressibility. Venous Reflux:  None. Other Findings:  None. IMPRESSION: Acute occlusive deep venous thrombosis seen involving the right superficial femoral, popliteal and calf veins. Electronically Signed   By: Marijo Conception, M.D.   On: 03/02/2018 18:43    Procedures Procedures (including critical care time)  Medications Ordered in ED Medications  apixaban (ELIQUIS) tablet 10 mg (10 mg Oral Given 03/02/18 1905)  HYDROcodone-acetaminophen (NORCO/VICODIN) 5-325 MG per tablet 1 tablet (1 tablet Oral Given 03/02/18 1910)     Initial Impression / Assessment and Plan / ED Course  I have reviewed the triage vital signs and the nursing notes.  Pertinent labs & imaging results that were available during my care of the patient were reviewed by me and considered in my medical decision making (see chart for details).  Presents to the emergency department for 5 days of right lower extremity swelling and discomfort.  No overlying signs of cellulitis, no injury or trauma to the leg does have history of DVT but is not currently  on any blood thinners.  No chest pain or shortness of breath, patient is not tachycardic, tachypneic or hypoxic to suggest PE.  She [facility] earlier today where she had lab work done which showed normal platelet count, stable hemoglobin and no leukocytosis, no acute electrolyte derangements and good renal function.  Will get DVT study.  DVT study positive for acute thrombus involving the right superficial femoral, popliteal and calf veins, feel patient will be a good candidate for Eliquis given good renal function.  First dose given here in the emergency department patient provided with information pack went over precautions of being on blood thinner patient instructed to avoid NSAIDs.  Patient to follow-up with her primary care doctor in the next week for checkup on how she is doing on this medication.  I have encouraged elevation and compression stockings to help with discomfort and swelling in the leg.  Strict return precautions discussed.  Patient expresses understanding and is in agreement with plan.  Final Clinical Impressions(s) / ED Diagnoses   Final diagnoses:  Acute deep vein thrombosis (DVT) of femoral vein of right lower extremity [facility])    ED Discharge Orders         Ordered    ELIQUIS STARTER PACK (ELIQUIS STARTER PACK) 5 MG TABS     03/02/18 1858    apixaban (ELIQUIS) KIT   Once     03/02/18 1858    Compression stockings     03/02/18 1913           Jacqlyn Larsen, Hershal Coria 03/02/18 Fredrich Romans, MD 03/02/18 2329

## 2018-03-02 NOTE — ED Triage Notes (Signed)
Pt c/o RT leg swelling x 5 days. States pain begins in inner thigh and radiates down to lower leg. Denies injury.

## 2018-03-02 NOTE — ED Provider Notes (Signed)
[facility] EMERGENCY DEPARTMENT Provider Note   CSN: [id number] Arrival date & time: 03/02/18  1303     History   Chief Complaint Chief Complaint  Patient presents with  . Leg Swelling    HPI Lindsey Hughes is a 47 y.o. female.  Pain/swelling in right lower extremity for several days.  Pain initiated in the distal medial aspect of the thigh and is now radiated to the right calf.  No dyspnea or or chest pain.  She is not on blood thinning medicine.  Past medical history includes cervical cancer which is in "remission".  No recent travel or prolonged immobilization.  Severity of discomfort is moderate.  Palpation and ambulation make pain worse     Past Medical History:  Diagnosis Date  . Anemia   . Anticoagulated on Coumadin    started 11-26-2015 for right upper arm deep and superficial vein thrombus  . Cervical cancer [facility]) oncologist-  dr livesay/  dr kinard/  dr Denman George   dx 08/2015-- Stage IIB poorly differentiated SCC--  chemotherapy weekly (started 10-04-2015)  and external radiation (started 10-05-2015) and planned high-dose radiation via tandom ring direct to cervix to start 11-09-2015  . Chemotherapy induced diarrhea   . Chemotherapy-induced nausea   . Deep vein thrombosis, upper right extremity ([place]) 11-25-2015 dx   deep and superficial involving axillary vein, brachial vein, and basilic vein  . Depression   . Dysuria   . GERD (gastroesophageal reflux disease)    caused by chemotherapy  . Headache   . History of thrombophlebitis RESOLVED-- but per pt prefer no bp or  puncture done in right arm   per pt symptoms 10-20-2015-- on 10-22-2015 dx Superficial thrombus RUE of right basilic vein,where IV access was difficult for first peripheral chemo (not deep venous system) / symptoms resolved with local measures  . Hypothyroidism   . Port-A-Cath in place   . Radiation    currently having radiation therapy started 10-04-2015  . Radiation 10/04/15-11/19/15   pelvis 45 Gy,  boost to 9 Gy  . Radiation 11/09/15-12/07/15   bracytherapy cervical - 27.5 Gy in 5 fractions  . Superficial thrombophlebitis of arm 11/25/2015   right upper arm-- secondary to IV access difficult for first chemo    Patient Active Problem List   Diagnosis Date Noted  . Diarrhea 01/18/2017  . 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 Back pain 10/11/2016  . Premature menopause 12/24/2015  . Long term current use of anticoagulant therapy 12/06/2015  . Sensation of lump in throat 12/06/2015  . Hypokalemia 12/06/2015  . UTI (lower urinary tract infection) 11/26/2015  . 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 Cervical cancer, FIGO stage IIB (HCC)   . Superficial thrombophlebitis of arm 10/31/2015  . Portacath in place 10/31/2015  . History of bilateral tubal ligation 09/25/2015  . Vaginal bleeding, abnormal 09/25/2015  . Thyroid activity decreased 09/25/2015  . Urinary frequency 09/25/2015  . 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 Obesity (BMI 35.0-39.9 without comorbidity) 09/16/2015    Past Surgical History:  Procedure Laterality Date  . CARPAL TUNNEL RELEASE Bilateral 2002 and 2009  . CESAREAN SECTION  1991   . DILATION AND CURETTAGE OF UTERUS N/A 09/03/2015   Procedure: EXAM UNDER ANESTHESIA  WITH FROZEN SECTION CERVICAL BIOPSY & ENDOCERVICAL CURRETTINGS;  Surgeon: Aloha Gell, MD;  Location: [facility];  Service: Gynecology;  Laterality: N/A;  . ECHO STRESS TEST  12-10-2014   normal w/ no evidence for  stress-induced ischemia/  normal LV function and wall motion , ef 65-70%  . IR GENERIC HISTORICAL  10/25/2015   IR RADIOLOGIST EVAL & MGMT 10/25/2015 Corrie Mckusick, DO [facility]  . IR GENERIC HISTORICAL  12/20/2015   IR REMOVAL TUN ACCESS W/ PORT W/O FL MOD SED 12/20/2015 Sandi Mariscal, MD [facility]  . LASIK Left 2015  . PORTACATH PLACEMENT  10-11-2015  . TANDEM RING INSERTION N/A 11/09/2015   Procedure: TANDEM RING INSERTION;  Surgeon:  Gery Pray, MD;  Location: [facility];  Service: Urology;  Laterality: N/A;  . TANDEM RING INSERTION N/A 11/16/2015   Procedure: TANDEM RING INSERTION;  Surgeon: Gery Pray, MD;  Location: [facility];  Service: Urology;  Laterality: N/A;  . TANDEM RING INSERTION N/A 11/23/2015   Procedure: TANDEM RING INSERTION;  Surgeon: Gery Pray, MD;  Location: [facility];  Service: Urology;  Laterality: N/A;  . TANDEM RING INSERTION N/A 11/30/2015   Procedure: TANDEM RING INSERTION;  Surgeon: Gery Pray, MD;  Location: [facility];  Service: Urology;  Laterality: N/A;  . TANDEM RING INSERTION N/A 12/07/2015   Procedure: TANDEM RING INSERTION;  Surgeon: Gery Pray, MD;  Location: [facility];  Service: Urology;  Laterality: N/A;  . TUBAL LIGATION  1996     OB History   None      Home Medications    Prior to Admission medications   Medication Sig Start Date End Date Taking? Authorizing Provider  levothyroxine (SYNTHROID, LEVOTHROID) 50 MCG tablet Take 50 mcg by mouth daily.  03/30/16   [provider]  PREMPRO 0.625-2.5 MG tablet TAKE 1 TABLET DAILY 03/28/17   Everitt Amber, MD  trimethoprim (TRIMPEX) 100 MG tablet  05/26/17   [provider]  venlafaxine XR (EFFEXOR-XR) 37.5 MG 24 hr capsule Take 1 capsule daily with breakfast. For hotflashes Patient taking differently: 75 mg. Take 1 capsule daily with breakfast. For hotflashes 06/07/16   Everitt Amber, MD    Family History Family History  Problem Relation Age of Onset  . CAD Mother        Premature disease  . Lung cancer Mother   . Lung cancer Father   . Brain cancer Father   . CAD Brother        MI in his 67s    Social History Social History   Tobacco Use  . Smoking status: Never Smoker  . Smokeless tobacco: Never Used  Substance Use Topics  . Alcohol use: No    Alcohol/week: 0.0 standard drinks  . Drug use: No     Allergies   Zofran  [ondansetron hcl]   Review of Systems Review of Systems  All other systems reviewed and are negative.    Physical Exam Updated Vital Signs BP 129/67 (BP Location: Right Arm)   Pulse 96   Temp 98.2 F (36.8 C) (Oral)   Resp 18   Ht 5\' 2"  (1.575 m)   Wt 97.5 kg   SpO2 100%   BMI 39.32 kg/m   Physical Exam  Constitutional: She is oriented to person, place, and time. She appears well-developed and well-nourished.  HENT:  Head: Normocephalic and atraumatic.  Eyes: Conjunctivae are normal.  Neck: Neck supple.  Cardiovascular: Normal rate and regular rhythm.  Pulmonary/Chest: Effort normal and breath sounds normal.  Abdominal: Soft. Bowel sounds are normal.  Musculoskeletal:  Right lower extremity: Tender distal medial thigh and posterior calf.  Moderate lower extremity edema.  Neurological: She is alert and oriented to person, place, and time.  Skin: Skin is warm and dry.  Psychiatric: She has a normal mood and affect. Her behavior is normal.  Nursing note and vitals reviewed.    ED Treatments / Results  Labs (all labs ordered are listed, but only abnormal results are displayed) Labs Reviewed  CBC WITH DIFFERENTIAL/PLATELET - Abnormal; Notable for the following components:      Result Value   Hemoglobin 11.6 (*)    Lymphs Abs 0.6 (*)    All other components within normal limits  COMPREHENSIVE METABOLIC PANEL - Abnormal; Notable for the following components:   Glucose, Bld 103 (*)    Albumin 3.4 (*)    All other components within normal limits    EKG None  Radiology No results found.  Procedures Procedures (including critical care time)  Medications Ordered in ED Medications - No data to display   Initial Impression / Assessment and Plan / ED Course  I have reviewed the triage vital signs and the nursing notes.  Pertinent labs & imaging results that were available during my care of the patient were reviewed by me and considered in my medical decision  making (see chart for details).     Patient complains of pain and swelling in the right lower extremity.  She has a history of cervical cancer and is at risk for a DVT.  No signs or symptoms of a pulmonary embolus.  I discussed the clinical options available at [facility] which included a Lovenox injection and a.m. ultrasound.  Patient preferred to be discharged and to seek advice elsewhere.  Final Clinical Impressions(s) / ED Diagnoses   Final diagnoses:  Acute pain of right lower extremity    ED Discharge Orders    None       Nat Christen, MD 03/02/18 1740

## 2018-03-02 NOTE — ED Notes (Signed)
Pt states that her right leg has been "throbbing" for 5 days. She went to [facility] and was told she needed [place] to check for dvt.

## 2018-03-02 NOTE — ED Triage Notes (Signed)
Pain in her right leg x 5 days. She was seen at [facility] today. Pt states they did not have an [place] to check for DVT.

## 2018-03-02 NOTE — Discharge Instructions (Addendum)
Recommend blood thinners and ultrasound of your right leg to rule out a blood clot.

## 2018-03-02 NOTE — Discharge Instructions (Addendum)
You have a blood clot in your femoral vein on the right leg that is causing your symptoms.  He will need to begin taking Eliquis twice daily, please follow the directions and the starter pack.  Because this medication is a blood thinner it increases your risk of bleeding if you fall, hit your head or under go any trauma you are increased risk of bleeding from this, so please come to the hospital for evaluation.  Avoid taking NSAIDs such as ibuprofen or Aleve while on this medication.  Is very important that you take regularly and do not miss any doses.  Follow-up with your primary care doctor in the next week for recheck to make sure you are doing well on this medicine.  To help with discomfort you can use Tylenol and would also like you to elevate the leg and use compression stockings to help reduce fluid buildup.  Return to the emergency department if you develop any chest pain, shortness of breath or pain with deep breath.

## 2018-03-04 LAB — CYTOLOGY - PAP
DIAGNOSIS: NEGATIVE
HPV (WINDOPATH): NOT DETECTED

## 2018-03-07 ENCOUNTER — Telehealth: Payer: Self-pay

## 2018-03-07 NOTE — Telephone Encounter (Signed)
Outgoing call to pt per Joylene John NP regarding "normal pap, showing benign radiation changes, HPV negative."  Pt voiced understanding.  No other needs per pt at this time.

## 2018-03-16 ENCOUNTER — Emergency Department (HOSPITAL_COMMUNITY)
Admission: EM | Admit: 2018-03-16 | Discharge: 2018-03-16 | Disposition: A | Discharge status: Home / Self Care | Payer: BLUE CROSS/BLUE SHIELD | Attending: Emergency Medicine | Admitting: Emergency Medicine

## 2018-03-16 ENCOUNTER — Emergency Department (HOSPITAL_BASED_OUTPATIENT_CLINIC_OR_DEPARTMENT_OTHER): Payer: BLUE CROSS/BLUE SHIELD

## 2018-03-16 ENCOUNTER — Other Ambulatory Visit: Payer: Self-pay

## 2018-03-16 ENCOUNTER — Encounter (HOSPITAL_COMMUNITY): Payer: Self-pay | Admitting: Emergency Medicine

## 2018-03-16 DIAGNOSIS — Z7901 Long term (current) use of anticoagulants: Secondary | ICD-10-CM | POA: Diagnosis not present

## 2018-03-16 DIAGNOSIS — Z79899 Other long term (current) drug therapy: Secondary | ICD-10-CM | POA: Insufficient documentation

## 2018-03-16 DIAGNOSIS — M79604 Pain in right leg: Secondary | ICD-10-CM | POA: Diagnosis not present

## 2018-03-16 DIAGNOSIS — R609 Edema, unspecified: Secondary | ICD-10-CM

## 2018-03-16 DIAGNOSIS — Z86718 Personal history of other venous thrombosis and embolism: Secondary | ICD-10-CM | POA: Insufficient documentation

## 2018-03-16 DIAGNOSIS — Z8541 Personal history of malignant neoplasm of cervix uteri: Secondary | ICD-10-CM | POA: Diagnosis not present

## 2018-03-16 DIAGNOSIS — E039 Hypothyroidism, unspecified: Secondary | ICD-10-CM | POA: Diagnosis not present

## 2018-03-16 LAB — COMPREHENSIVE METABOLIC PANEL
ALK PHOS: 97 U/L (ref 38–126)
ALT: 14 U/L (ref 0–44)
AST: 20 U/L (ref 15–41)
Albumin: 3.1 g/dL — ABNORMAL LOW (ref 3.5–5.0)
Anion gap: 11 (ref 5–15)
BUN: 13 mg/dL (ref 6–20)
CALCIUM: 8.6 mg/dL — AB (ref 8.9–10.3)
CO2: 22 mmol/L (ref 22–32)
CREATININE: 1.08 mg/dL — AB (ref 0.44–1.00)
Chloride: 105 mmol/L (ref 98–111)
Glucose, Bld: 92 mg/dL (ref 70–99)
Potassium: 3.5 mmol/L (ref 3.5–5.1)
Sodium: 138 mmol/L (ref 135–145)
Total Bilirubin: 0.2 mg/dL — ABNORMAL LOW (ref 0.3–1.2)
Total Protein: 7.1 g/dL (ref 6.5–8.1)

## 2018-03-16 LAB — CBC WITH DIFFERENTIAL/PLATELET
Abs Immature Granulocytes: 0.01 10*3/uL (ref 0.00–0.07)
BASOS ABS: 0 10*3/uL (ref 0.0–0.1)
Basophils Relative: 1 %
EOS ABS: 0.1 10*3/uL (ref 0.0–0.5)
EOS PCT: 2 %
HCT: 35.5 % — ABNORMAL LOW (ref 36.0–46.0)
Hemoglobin: 10.7 g/dL — ABNORMAL LOW (ref 12.0–15.0)
Immature Granulocytes: 0 %
Lymphocytes Relative: 15 %
Lymphs Abs: 0.9 10*3/uL (ref 0.7–4.0)
MCH: 26.2 pg (ref 26.0–34.0)
MCHC: 30.1 g/dL (ref 30.0–36.0)
MCV: 87 fL (ref 80.0–100.0)
Monocytes Absolute: 0.5 10*3/uL (ref 0.1–1.0)
Monocytes Relative: 8 %
NRBC: 0 % (ref 0.0–0.2)
Neutro Abs: 4.4 10*3/uL (ref 1.7–7.7)
Neutrophils Relative %: 74 %
Platelets: 450 10*3/uL — ABNORMAL HIGH (ref 150–400)
RBC: 4.08 MIL/uL (ref 3.87–5.11)
RDW: 13.2 % (ref 11.5–15.5)
WBC: 5.9 10*3/uL (ref 4.0–10.5)

## 2018-03-16 NOTE — ED Notes (Signed)
Pt now c/o mid chest pain, pt placed on cardiac monitor and EKG obtained. Dr. Vanita Panda made aware

## 2018-03-16 NOTE — ED Triage Notes (Signed)
Pt c/o increased pain and swelling to right lower leg. Pt was recently dx with a DVT and is currently taking Eliquis for same.  Pt st's increased pain and swelling started today after going shopping

## 2018-03-16 NOTE — ED Notes (Signed)
Strong pedal pulse present

## 2018-03-16 NOTE — ED Notes (Signed)
Attempted to use sunquest for labels. Program not responding. White labels used.

## 2018-03-16 NOTE — ED Provider Notes (Signed)
[facility] EMERGENCY DEPARTMENT Provider Note   CSN: [id number] Arrival date & time: 03/16/18  1612     History   Chief Complaint Chief Complaint  Patient presents with  . Leg Pain    HPI Lindsey Hughes is a 47 y.o. female.  HPI Pleasant female presents with concern of right leg pain, swelling. She has a notable history of diagnosis with DVT 2 weeks ago. She is currently using Xarelto, though she has had Coumadin for prior thrombosis. She notes that she was recovering well, was able to stop her analgesia 3 days ago purulent however, today, about 8 hours ago she noticed pain, swelling return to the right leg diffusely. She has preserved capacity to move the toes, though some heavy sensation. No abdominal pain, no chest pain, no dyspnea, no fever, no chills.  Past Medical History:  Diagnosis Date  . Anemia   . Anticoagulated on Coumadin    started 11-26-2015 for right upper arm deep and superficial vein thrombus  . Cervical cancer [facility]) oncologist-  dr livesay/  dr kinard/  dr Denman George   dx 08/2015-- Stage IIB poorly differentiated SCC--  chemotherapy weekly (started 10-04-2015)  and external radiation (started 10-05-2015) and planned high-dose radiation via tandom ring direct to cervix to start 11-09-2015  . Chemotherapy induced diarrhea   . Chemotherapy-induced nausea   . Deep vein thrombosis, upper right extremity ([place]) 11-25-2015 dx   deep and superficial involving axillary vein, brachial vein, and basilic vein  . Depression   . Dysuria   . GERD (gastroesophageal reflux disease)    caused by chemotherapy  . Headache   . History of thrombophlebitis RESOLVED-- but per pt prefer no bp or  puncture done in right arm   per pt symptoms 10-20-2015-- on 10-22-2015 dx Superficial thrombus RUE of right basilic vein,where IV access was difficult for first peripheral chemo (not deep venous system) / symptoms resolved with local measures  . Hypothyroidism   .  Port-A-Cath in place   . Radiation    currently having radiation therapy started 10-04-2015  . Radiation 10/04/15-11/19/15   pelvis 45 Gy, boost to 9 Gy  . Radiation 11/09/15-12/07/15   bracytherapy cervical - 27.5 Gy in 5 fractions  . Superficial thrombophlebitis of arm 11/25/2015   right upper arm-- secondary to IV access difficult for first chemo    Patient Active Problem List   Diagnosis Date Noted  . Diarrhea 01/18/2017  . 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 Back pain 10/11/2016  . Premature menopause 12/24/2015  . Long term current use of anticoagulant therapy 12/06/2015  . Sensation of lump in throat 12/06/2015  . Hypokalemia 12/06/2015  . UTI (lower urinary tract infection) 11/26/2015  . 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 Cervical cancer, FIGO stage IIB ([facility])   . Superficial thrombophlebitis of arm 10/31/2015  . Portacath in place 10/31/2015  . History of bilateral tubal ligation 09/25/2015  . Vaginal bleeding, abnormal 09/25/2015  . Thyroid activity decreased 09/25/2015  . Urinary frequency 09/25/2015  . Malignant neoplasm of exocervix ([place]) 09/16/2015  . Obesity (BMI 35.0-39.9 without comorbidity) 09/16/2015    Past Surgical History:  Procedure Laterality Date  . CARPAL TUNNEL RELEASE Bilateral 2002 and 2009  . CESAREAN SECTION  1991   . DILATION AND CURETTAGE OF UTERUS N/A 09/03/2015   Procedure: EXAM UNDER ANESTHESIA  WITH FROZEN SECTION CERVICAL BIOPSY & ENDOCERVICAL CURRETTINGS;  Surgeon: Aloha Gell, MD;  Location: [facility];  Service: Gynecology;  Laterality: N/A;  . ECHO STRESS TEST  12-10-2014   normal w/ no evidence for stress-induced ischemia/  normal LV function and wall motion , ef 65-70%  . IR GENERIC HISTORICAL  10/25/2015   IR RADIOLOGIST EVAL & MGMT 10/25/2015 Corrie Mckusick, DO [facility]  . IR GENERIC HISTORICAL  12/20/2015   IR REMOVAL TUN ACCESS W/ PORT W/O FL MOD SED 12/20/2015 Sandi Mariscal, MD [facility]  .  LASIK Left 2015  . PORTACATH PLACEMENT  10-11-2015  . TANDEM RING INSERTION N/A 11/09/2015   Procedure: TANDEM RING INSERTION;  Surgeon: Gery Pray, MD;  Location: [facility];  Service: Urology;  Laterality: N/A;  . TANDEM RING INSERTION N/A 11/16/2015   Procedure: TANDEM RING INSERTION;  Surgeon: Gery Pray, MD;  Location: [facility];  Service: Urology;  Laterality: N/A;  . TANDEM RING INSERTION N/A 11/23/2015   Procedure: TANDEM RING INSERTION;  Surgeon: Gery Pray, MD;  Location: [facility];  Service: Urology;  Laterality: N/A;  . TANDEM RING INSERTION N/A 11/30/2015   Procedure: TANDEM RING INSERTION;  Surgeon: Gery Pray, MD;  Location: [facility];  Service: Urology;  Laterality: N/A;  . TANDEM RING INSERTION N/A 12/07/2015   Procedure: TANDEM RING INSERTION;  Surgeon: Gery Pray, MD;  Location: [facility];  Service: Urology;  Laterality: N/A;  . TUBAL LIGATION  1996     OB History   None      Home Medications    Prior to Admission medications   Medication Sig Start Date End Date Taking? Authorizing Provider  ELIQUIS STARTER PACK (ELIQUIS STARTER PACK) 5 MG TABS Take as directed on package: start with two-5mg  tablets twice daily for 7 days. On day 8, switch to one-5mg  tablet twice daily. 03/02/18   Jacqlyn Larsen, PA-C  levothyroxine (SYNTHROID, LEVOTHROID) 50 MCG tablet Take 50 mcg by mouth daily.  03/30/16   [provider]  PREMPRO 0.625-2.5 MG tablet TAKE 1 TABLET DAILY 03/28/17   Everitt Amber, MD  trimethoprim (TRIMPEX) 100 MG tablet  05/26/17   [provider]  venlafaxine XR (EFFEXOR-XR) 37.5 MG 24 hr capsule Take 1 capsule daily with breakfast. For hotflashes Patient taking differently: 75 mg. Take 1 capsule daily with breakfast. For hotflashes 06/07/16   Everitt Amber, MD    Family History Family History  Problem Relation Age of Onset  . CAD Mother        Premature disease    . Lung cancer Mother   . Lung cancer Father   . Brain cancer Father   . CAD Brother        MI in his 39s    Social History Social History   Tobacco Use  . Smoking status: Never Smoker  . Smokeless tobacco: Never Used  Substance Use Topics  . Alcohol use: No    Alcohol/week: 0.0 standard drinks  . Drug use: No     Allergies   Zofran [ondansetron hcl]   Review of Systems Review of Systems  Constitutional:       Per HPI, otherwise negative  HENT:       Per HPI, otherwise negative  Respiratory:       Per HPI, otherwise negative  Cardiovascular:       Per HPI, otherwise negative  Gastrointestinal: Negative for vomiting.  Endocrine:       Negative aside from HPI  Genitourinary:       Neg aside  from HPI   Musculoskeletal:       Per HPI, otherwise negative  Skin: Negative.   Neurological: Negative for syncope.  Hematological:       Per HPI     Physical Exam Updated Vital Signs BP (!) 156/94 (BP Location: Right Arm)   Pulse 83   Temp 98.4 F (36.9 C) (Oral)   Resp 18   Ht 5\' 2"  (1.575 m)   Wt 97.5 kg   SpO2 100%   BMI 39.31 kg/m   Physical Exam  Constitutional: She is oriented to person, place, and time. She appears well-developed and well-nourished. No distress.  HENT:  Head: Normocephalic and atraumatic.  Eyes: Conjunctivae and EOM are normal.  Cardiovascular: Normal rate and regular rhythm.  Pulmonary/Chest: Effort normal and breath sounds normal. No stridor. No respiratory distress.  Abdominal: She exhibits no distension.  Musculoskeletal: She exhibits edema. She exhibits no deformity.  Swelling throughout the left leg, no discoloration.  The patient can move the feet appropriately, and the toes on the affected side appropriately. Appreciable pulses dorsalis pedis, normal  Neurological: She is alert and oriented to person, place, and time. No cranial nerve deficit.  Skin: Skin is warm and dry.  Psychiatric: She has a normal mood and affect.   Nursing note and vitals reviewed.    ED Treatments / Results  Labs (all labs ordered are listed, but only abnormal results are displayed) Labs Reviewed  COMPREHENSIVE METABOLIC PANEL - Abnormal; Notable for the following components:      Result Value   Creatinine, Ser 1.08 (*)    Calcium 8.6 (*)    Albumin 3.1 (*)    Total Bilirubin 0.2 (*)    All other components within normal limits  CBC WITH DIFFERENTIAL/PLATELET - Abnormal; Notable for the following components:   Hemoglobin 10.7 (*)    HCT 35.5 (*)    Platelets 450 (*)    All other components within normal limits    EKG EKG Interpretation  Date/Time:  Saturday March 16 2018 17:11:03 EST Ventricular Rate:  78 PR Interval:    QRS Duration: 102 QT Interval:  388 QTC Calculation: 442 R Axis:   80 Text Interpretation:  Sinus rhythm T wave abnormality Abnormal ekg Confirmed by Carmin Muskrat [phone]) on 03/16/2018 5:43:00 PM   Radiology Radiology results reviewed with our sonographer.  Procedures Procedures (including critical care time)  Medications Ordered in ED Medications - No data to display   Initial Impression / Assessment and Plan / ED Course  I have reviewed the triage vital signs and the nursing notes.  Pertinent labs & imaging results that were available during my care of the patient were reviewed by me and considered in my medical decision making (see chart for details).    Chart review notable for ultrasound findings as below from 2 weeks ago:   IMPRESSION: Acute occlusive deep venous thrombosis seen involving the right superficial femoral, popliteal and calf veins.  6:58 PM On repeat exam patient is awake and alert, in no distress. She did have transient chest pain, but EKG was unremarkable. I discussed patient's ultrasound results, negative for progression of her clot, without complete occlusion, without substantial proximal burn. Subsequently discussed the patient's findings with the  patient and her husband. No new complaints, she is hemodynamically unremarkable. Absent evidence for PE on exam, EKG, with physical exam findings, and with no tachypnea, hypoxia, and with reassuring ultrasound, patient will continue her oral anticoagulant, will follow up with outpatient providers.  Final Clinical Impressions(s) / ED Diagnoses   Final diagnoses:  Right leg pain     Carmin Muskrat, MD 03/16/18 1900

## 2018-03-16 NOTE — Progress Notes (Signed)
VASCULAR LAB PRELIMINARY  PRELIMINARY  PRELIMINARY  PRELIMINARY  Right lower extremity venous duplex  completed.    Preliminary report:  The DVT found 03/02/18, remains in the femoral vein, popliteal, posterior tibial, peroneal, and gastroc veins.  No propagation noted to the common femoral vein.  Ardis Lawley, RVT 03/16/2018, 6:20 PM

## 2018-03-16 NOTE — ED Notes (Signed)
PT states understanding of care given, follow up care. PT ambulated from ED to car with a steady gait.  

## 2018-03-16 NOTE — Discharge Instructions (Signed)
As discussed, your evaluation today has been largely reassuring.  But, it is important that you monitor your condition carefully, and do not hesitate to return to the ED if you develop new, or concerning changes in your condition. ? ?Otherwise, please follow-up with your physician for appropriate ongoing care. ? ?

## 2018-07-19 ENCOUNTER — Telehealth: Payer: Self-pay | Admitting: *Deleted

## 2018-07-19 NOTE — Telephone Encounter (Signed)
Called and spoke with the patient regarding her appt on 4/16. Appt moved due COVID-19 and moving all routine appts out several weeks. Appt moved to 5/20. Explained that she will receive a call the day before to be prescreened and then again the day of the appt at the front desk. Also explained that her tempature will be taking at the front desk and the no visitor policy.

## 2018-07-22 ENCOUNTER — Other Ambulatory Visit: Payer: Self-pay | Admitting: Gynecologic Oncology

## 2018-07-22 ENCOUNTER — Telehealth: Payer: Self-pay | Admitting: *Deleted

## 2018-07-22 DIAGNOSIS — C531 Malignant neoplasm of exocervix: Secondary | ICD-10-CM

## 2018-07-22 NOTE — Telephone Encounter (Signed)
Patient called and stated "I need to see when I can get an appt, I'm having some issues. I'm having lower back pain and spotting. The spotting is pinkish but I don't have a wear a pad or panty liner. Also I'm having pain again when my husband and I get naked. I know they said those are some signs of it coming back." Explained that I would give the message to [facility] APP and we would call her back.

## 2018-07-22 NOTE — Telephone Encounter (Signed)
Told Lindsey Bullinsthat Dr. Denman George would like to get a CT scan of her abdomen and pelvis to check for reoccurace of the cancer with her symptoms. She is to come tomorrow to [facility] for a stat B-met and Pick up the Oral contrast and directions. CT scan is scheduled for Wednesday 07-24-18 at [facility] radiology.at 11 am. Npo after 0700. Drink 1 bottle of  contrast at 0900 and 1000. She will see Dr. Denman George on Thursday 07-25-18 to discuss the results at 0930.  She needs to check in at 0845 as there may be a delay in check in with COVID 19 screening and Dr. Denman George has an OR case after her appointment at 1100. Pt verbalized understanding.

## 2018-07-22 NOTE — Progress Notes (Signed)
Patient called the office with new symptoms of vaginal bleeding, back pain, and dyspareunia. Per Dr. Denman George, plan for CT scan of the abdomen and pelvis to evaluate for recurrence of cervical ca prior to appt in the office on Thurs.

## 2018-07-23 ENCOUNTER — Inpatient Hospital Stay: Payer: BLUE CROSS/BLUE SHIELD | Attending: Gynecologic Oncology

## 2018-07-23 ENCOUNTER — Other Ambulatory Visit: Payer: Self-pay

## 2018-07-23 DIAGNOSIS — C539 Malignant neoplasm of cervix uteri, unspecified: Secondary | ICD-10-CM | POA: Diagnosis present

## 2018-07-23 DIAGNOSIS — C531 Malignant neoplasm of exocervix: Secondary | ICD-10-CM

## 2018-07-23 LAB — BASIC METABOLIC PANEL - CANCER CENTER ONLY
Anion gap: 12 (ref 5–15)
BUN: 11 mg/dL (ref 6–20)
CHLORIDE: 104 mmol/L (ref 98–111)
CO2: 24 mmol/L (ref 22–32)
CREATININE: 1 mg/dL (ref 0.44–1.00)
Calcium: 9 mg/dL (ref 8.9–10.3)
GFR, Est AFR Am: >60 mL/min (ref >60)
GFR, Estimated: >60 mL/min (ref >60)
Glucose, Bld: 101 mg/dL — ABNORMAL HIGH (ref 70–99)
POTASSIUM: 3.4 mmol/L — AB (ref 3.5–5.1)
Sodium: 140 mmol/L (ref 135–145)

## 2018-07-24 ENCOUNTER — Other Ambulatory Visit (HOSPITAL_COMMUNITY): Payer: BLUE CROSS/BLUE SHIELD

## 2018-07-24 ENCOUNTER — Telehealth: Payer: Self-pay | Admitting: *Deleted

## 2018-07-24 ENCOUNTER — Ambulatory Visit (HOSPITAL_COMMUNITY)
Admission: RE | Admit: 2018-07-24 | Discharge: 2018-07-24 | Disposition: A | Discharge status: Home / Self Care | Payer: BLUE CROSS/BLUE SHIELD | Source: Ambulatory Visit | Attending: Gynecologic Oncology | Admitting: Gynecologic Oncology

## 2018-07-24 DIAGNOSIS — C531 Malignant neoplasm of exocervix: Secondary | ICD-10-CM | POA: Diagnosis not present

## 2018-07-24 MED ORDER — IOHEXOL 300 MG/ML  SOLN
100.0000 mL | Freq: Once | INTRAMUSCULAR | Status: AC | PRN
  Administered 2018-07-24: 13:00:00 100 mL via INTRAVENOUS

## 2018-07-24 NOTE — Telephone Encounter (Signed)
Attempted to contact the patient to prescreen her for the appt tomorrow. Left a message for the patient to call the office back.

## 2018-07-24 NOTE — Telephone Encounter (Signed)
Patient called back and was prescreened for her appt tomorrow. Patient has not had any signs/symptoms, traveled or any exposure. Patient has had any contact with anyone sick or any exposure. Explained that she will be asked the questions again tomorrow at the front desk; along with a temperature check. Also explained the no visitor policy

## 2018-07-25 ENCOUNTER — Encounter: Payer: Self-pay | Admitting: Gynecologic Oncology

## 2018-07-25 ENCOUNTER — Other Ambulatory Visit: Payer: Self-pay

## 2018-07-25 ENCOUNTER — Inpatient Hospital Stay: Payer: BLUE CROSS/BLUE SHIELD | Attending: Gynecologic Oncology | Admitting: Gynecologic Oncology

## 2018-07-25 VITALS — BP 141/88 | HR 80 | Temp 98.4°F | Resp 18 | Ht 62.0 in | Wt 215.0 lb

## 2018-07-25 DIAGNOSIS — Z08 Encounter for follow-up examination after completed treatment for malignant neoplasm: Secondary | ICD-10-CM

## 2018-07-25 DIAGNOSIS — F329 Major depressive disorder, single episode, unspecified: Secondary | ICD-10-CM | POA: Diagnosis not present

## 2018-07-25 DIAGNOSIS — R197 Diarrhea, unspecified: Secondary | ICD-10-CM | POA: Diagnosis not present

## 2018-07-25 DIAGNOSIS — Z923 Personal history of irradiation: Secondary | ICD-10-CM

## 2018-07-25 DIAGNOSIS — Z86718 Personal history of other venous thrombosis and embolism: Secondary | ICD-10-CM | POA: Diagnosis not present

## 2018-07-25 DIAGNOSIS — E039 Hypothyroidism, unspecified: Secondary | ICD-10-CM | POA: Insufficient documentation

## 2018-07-25 DIAGNOSIS — N939 Abnormal uterine and vaginal bleeding, unspecified: Secondary | ICD-10-CM

## 2018-07-25 DIAGNOSIS — R1030 Lower abdominal pain, unspecified: Secondary | ICD-10-CM | POA: Diagnosis not present

## 2018-07-25 DIAGNOSIS — Z8541 Personal history of malignant neoplasm of cervix uteri: Secondary | ICD-10-CM

## 2018-07-25 DIAGNOSIS — K219 Gastro-esophageal reflux disease without esophagitis: Secondary | ICD-10-CM | POA: Diagnosis not present

## 2018-07-25 DIAGNOSIS — Z7901 Long term (current) use of anticoagulants: Secondary | ICD-10-CM | POA: Insufficient documentation

## 2018-07-25 DIAGNOSIS — Z9221 Personal history of antineoplastic chemotherapy: Secondary | ICD-10-CM | POA: Diagnosis not present

## 2018-07-25 NOTE — Patient Instructions (Signed)
The CT scan showed a possible fibroid and Dr Denman George has ordered an [place] to better characterize this.  It does not look like cancer.  Please contact Dr Serita Grit office (at [phone]) in June, 2020 to request an appointment with her for November, 2020. You can cancel your appointment for May, 2020.

## 2018-07-25 NOTE — Progress Notes (Signed)
GYN ONCOLOGY OFFICE VISIT    Referring MD:  Dr. Oren Binet Snedden 48 y.o. female  CC:  Chief Complaint  Patient presents with  . Vaginal Bleeding    Symptom Management, Hx cervical ca  . Lower abdominal pain    Assessment/Plan:  [name]  is a 48 y.o.  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  Complete clinical response including on post-treatment restaging PET in October, 2017. No evidence of recurrence.  1/ Hx of cervical cancer  No clinical evidence of recurrence. Will order TVUS to evaluate the possible fibroid in uterus. Pending these results, if they are suspicious we will evaluate with PET. If confirmatory for fibroid we will not need this additional imaging. Will see her for follow-up in 6 months. Follow-up pap results from today. If normal, recommend repeat in or after November, 2020.   2/Diarrhea and fecal incontinence,  - normal colonoscopy (radiation changes), continue supportive care  3/ Back pain - likely secondary to bone changes from radiation and chronic LBP. Stable symptoms. Continue calcium supplementation.    HPI: Lindsey Hughes is a very pleasant 26 year with cervical cancer.  The patient presented with report of  a 4 month history of peristent daily vaginal bleeding. She has also had back pain and more recently random urinary incontinence.  She cannot remember when she last had a pap smear (5-10 years ago) but she does recall having a history of abnormal paps in the past, though was not treated for these and feels that she "was never told they needed treatment".   As workup for her bleeding she saw Dr Pamala Hurry. An office endometrial biopsy was performed on 08/12/15 and revealed benign, weakly proliferative endoemetrium. During that office exam the posterior lip of the cervix was not visualized due to positioning of the uterus and body  habitus. Passage of the pipelle was difficult due to obstruction at the endocervix. Pap was taken but was not resulted by the time of her scheduled hysterectomy. It later returned as ASC-H, positive for high risk HPV.  The patient was scheduled for a robotic hysterectomy and on 09/03/15 was taken to the OR. Intraoperative findings were significant for a very abnormal posterior lip of cervix which was nodular and friable. The cervix could be visualized intra-op under general anesthetic. 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 The procedure was aborted and a referral was made to [place].  PET scan on 09/17/15 showed hypermetabolism of a 5.2cm cervical mass and bilateral external iliac nodes with increased FDG uptake suggestive of metastatic disease. Between 10/04/15 to 11/19/15 she received external beam radiation (45 gy in 25 fractions) with radiosensitizing CDDP. Between 11/09/15- 12/07/15 she received intracavitary brachytherapy (HDR) with 27.5Gy given in 5 fractions. She additionally received a pelvic boost of 9 Gy. She tolerated treatment well with no major GI or GU toxicity.  She did develop a Rt UE DVT for which her port was removed and she was started on lovenox and coumadin.  Post treatment re-staging PET scan was performed on 01/27/16 which showed complete clinical response in the previously enlarged and avid pelvic nodes. No residual cervical mass and residual cervical FDG avidity of 4 demonstrating complete clinical response. No new lesions.   In February 2018 she complained of back pain. PET/CT showed no evidence of malgnancy/recurrence. MRI  spine showed some bulging lumbar discs. She had PET avidity in the thyroid gland but negative [place].  In February, 2019 she developed back pain again. Repeat PET/Ct showed no evidence of recurrence.   Interval Hx: She continues to have diarrhea refractory to  stool bulking agents and anti-diarrhea agents. Colonoscopy normal (radiation changes).  She reports back pain and vaginal spotting x 3 weeks. On further questioning her back pain is unchanged.  CT abd/pelvis was ordered and performed on 07/24/18 due to symptoms of spotting and back pain. This revealed No definite findings to strongly indicate metastatic disease in the abdomen or pelvis. There is a ill-defined hypovascular area in the left side of the uterine fundus. This is nonspecific, but favored to represent a small fibroid. Further evaluation with nonemergent pelvic ultrasound should be considered in the near future to better evaluate this finding given the patient's symptoms.   Current Meds:  Outpatient Encounter Medications as of 07/25/2018  Medication Sig  . HYDROcodone-acetaminophen (NORCO/VICODIN) 5-325 MG tablet Take 1 tablet by mouth every 12 (twelve) hours as needed.  Marland Kitchen ELIQUIS STARTER PACK (ELIQUIS STARTER PACK) 5 MG TABS Take as directed on package: start with two-5mg  tablets twice daily for 7 days. On day 8, switch to one-5mg  tablet twice daily.  Marland Kitchen levothyroxine (SYNTHROID, LEVOTHROID) 50 MCG tablet Take 50 mcg by mouth daily.   Marland Kitchen PREMPRO 0.625-2.5 MG tablet TAKE 1 TABLET DAILY  . trimethoprim (TRIMPEX) 100 MG tablet   . venlafaxine XR (EFFEXOR-XR) 37.5 MG 24 hr capsule Take 1 capsule daily with breakfast. For hotflashes (Patient taking differently: 75 mg. Take 1 capsule daily with breakfast. For hotflashes)   No facility-administered encounter medications on file as of 07/25/2018.     Allergy:  Allergies  Allergen Reactions  . Zofran [Ondansetron Hcl] Other (See Comments)    headache    Social Hx:   Social History   Socioeconomic History  . Marital status: Married    Spouse name: Not on file  . Number of children: 1  . Years of education: Not on file  . Highest education level: Not on file  Occupational History  . Not on file  Social Needs  . Financial resource strain:  Not on file  . Food insecurity:    Worry: Not on file    Inability: Not on file  . Transportation needs:    Medical: Not on file    Non-medical: Not on file  Tobacco Use  . Smoking status: Never Smoker  . Smokeless tobacco: Never Used  Substance and Sexual Activity  . Alcohol use: No    Alcohol/week: 0.0 standard drinks  . Drug use: No  . Sexual activity: Yes    Birth control/protection: Surgical  Lifestyle  . Physical activity:    Days per week: Not on file    Minutes per session: Not on file  . Stress: Not on file  Relationships  . Social connections:    Talks on phone: Not on file    Gets together: Not on file    Attends religious service: Not on file    Active member of club or organization: Not on file    Attends meetings of clubs or organizations: Not on file    Relationship status: Not on file  . Intimate partner violence:    Fear of current or ex partner: Not on file    Emotionally abused: Not on file    Physically abused: Not on file    Forced sexual activity:  Not on file  Other Topics Concern  . Not on file  Social History Narrative  . Not on file    Past Surgical Hx:  Past Surgical History:  Procedure Laterality Date  . CARPAL TUNNEL RELEASE Bilateral 2002 and 2009  . CESAREAN SECTION  1991   . DILATION AND CURETTAGE OF UTERUS N/A 09/03/2015   Procedure: EXAM UNDER ANESTHESIA  WITH FROZEN SECTION CERVICAL BIOPSY & ENDOCERVICAL CURRETTINGS;  Surgeon: Aloha Gell, MD;  Location: [facility];  Service: Gynecology;  Laterality: N/A;  . ECHO STRESS TEST  12-10-2014   normal w/ no evidence for stress-induced ischemia/  normal LV function and wall motion , ef 65-70%  . IR GENERIC HISTORICAL  10/25/2015   IR RADIOLOGIST EVAL & MGMT 10/25/2015 Corrie Mckusick, DO [facility]  . IR GENERIC HISTORICAL  12/20/2015   IR REMOVAL TUN ACCESS W/ PORT W/O FL MOD SED 12/20/2015 Sandi Mariscal, MD [facility]  . LASIK Left 2015  . PORTACATH PLACEMENT  10-11-2015  . TANDEM RING  INSERTION N/A 11/09/2015   Procedure: TANDEM RING INSERTION;  Surgeon: Gery Pray, MD;  Location: [facility];  Service: Urology;  Laterality: N/A;  . TANDEM RING INSERTION N/A 11/16/2015   Procedure: TANDEM RING INSERTION;  Surgeon: Gery Pray, MD;  Location: [facility];  Service: Urology;  Laterality: N/A;  . TANDEM RING INSERTION N/A 11/23/2015   Procedure: TANDEM RING INSERTION;  Surgeon: Gery Pray, MD;  Location: [facility];  Service: Urology;  Laterality: N/A;  . TANDEM RING INSERTION N/A 11/30/2015   Procedure: TANDEM RING INSERTION;  Surgeon: Gery Pray, MD;  Location: [facility];  Service: Urology;  Laterality: N/A;  . TANDEM RING INSERTION N/A 12/07/2015   Procedure: TANDEM RING INSERTION;  Surgeon: Gery Pray, MD;  Location: [facility];  Service: Urology;  Laterality: N/A;  . TUBAL LIGATION  1996    Past Medical Hx:  Past Medical History:  Diagnosis Date  . Anemia   . Anticoagulated on Coumadin    started 11-26-2015 for right upper arm deep and superficial vein thrombus  . Cervical cancer [facility]) oncologist-  dr livesay/  dr kinard/  dr Denman George   dx 08/2015-- Stage IIB poorly differentiated SCC--  chemotherapy weekly (started 10-04-2015)  and external radiation (started 10-05-2015) and planned high-dose radiation via tandom ring direct to cervix to start 11-09-2015  . Chemotherapy induced diarrhea   . Chemotherapy-induced nausea   . Deep vein thrombosis, upper right extremity ([place]) 11-25-2015 dx   deep and superficial involving axillary vein, brachial vein, and basilic vein  . Depression   . Dysuria   . GERD (gastroesophageal reflux disease)    caused by chemotherapy  . Headache   . History of thrombophlebitis RESOLVED-- but per pt prefer no bp or  puncture done in right arm   per pt symptoms 10-20-2015-- on 10-22-2015 dx Superficial thrombus RUE of right basilic vein,where IV access was difficult  for first peripheral chemo (not deep venous system) / symptoms resolved with local measures  . Hypothyroidism   . Port-A-Cath in place   . Radiation    currently having radiation therapy started 10-04-2015  . Radiation 10/04/15-11/19/15   pelvis 45 Gy, boost to 9 Gy  . Radiation 11/09/15-12/07/15   bracytherapy cervical - 27.5 Gy in 5 fractions  . Superficial thrombophlebitis of arm 11/25/2015   right upper arm-- secondary to IV access difficult for first chemo    Past Gynecological  History:  G1P1, + abnormal pap smears remotely. No recent pap history in 5-10 years.  No LMP recorded. (Menstrual status: Chemotherapy).  Family Hx:  Family History  Problem Relation Age of Onset  . CAD Mother        Premature disease  . Lung cancer Mother   . Lung cancer Father   . Brain cancer Father   . CAD Brother        MI in his 24s    Review of Systems:  Constitutional  + hot flashes  ENT Normal appearing ears and nares bilaterally Cardiovascular  No chest pain, shortness of breath, or edema  Pulmonary  No cough or wheeze.  Gastro Intestinal  No nausea, vomitting, or diarrhoea. No bright red blood per rectum, no abdominal pain, change in bowel movement, or constipation. + fecal incontinence and loose stools x 12/day.  Diarrhea precipitated  by gustation. Genito Urinary  No frequency, urgency, dysuria, no bleeding Musculo Skeletal  No myalgia, arthralgia, Neurologic  No weakness, numbness, change in gait,  Psychology  No depression, anxiety, insomnia.   Vitals:  Blood pressure (!) 141/88, pulse 80, temperature 98.4 F (36.9 C), temperature source Oral, resp. rate 18, height 5\' 2"  (1.575 m), weight 215 lb (97.5 kg), SpO2 100 %.  Physical Exam: WD in NAD Neck  Supple NROM, without any enlargements. Oral candidiasis noted. Lymph Node Survey No cervical supraclavicular or inguinal adenopathy Cardiovascular  Pulse normal rate, regularity and rhythm. S1 and S2 normal.  Lungs  Clear  to auscultation bilateraly, without wheezes/crackles/rhonchi. Good air movement.  Skin  No rash/lesions/breakdown  Psychiatry  Alert and oriented to person, place, and time  Abdomen  Normoactive bowel sounds, abdomen soft, non-tender and obese without evidence of hernia.  Back No CVA tenderness Genito Urinary  Vulva/vagina: Normal external female genitalia.  No lesions. No discharge or bleeding.  Bladder/urethra:  No lesions or masses, well supported bladder  Vagina: normal with no lesions  Cervix:Cervix flush with the vaginal vault.  NO lesions, no parmetrial nodularity No palpable or visible tumor.   Uterus: 10cm minimally mobile,   Adnexa: no palpable masses. Rectal  Good tone, no masses no cul de sac nodularity. No blood on glove. No parametrial extension minimally bilateraly Extremities  No bilateral cyanosis, clubbing or edema.   Thereasa Solo, MD  07/25/2018, 9:18 AM

## 2018-08-08 ENCOUNTER — Ambulatory Visit: Payer: BLUE CROSS/BLUE SHIELD | Admitting: Gynecologic Oncology

## 2018-08-15 ENCOUNTER — Telehealth: Payer: Self-pay | Admitting: *Deleted

## 2018-08-15 NOTE — Telephone Encounter (Signed)
Called and spoke with the patient regarding her appts. Explained that since her [place] scan was moved , Dr. Denman George would like to move her MD visit to after the scan. Appt moved to 6/3

## 2018-08-27 ENCOUNTER — Ambulatory Visit (HOSPITAL_COMMUNITY): Payer: BLUE CROSS/BLUE SHIELD

## 2018-09-11 ENCOUNTER — Ambulatory Visit: Payer: BLUE CROSS/BLUE SHIELD | Admitting: Gynecologic Oncology

## 2018-09-24 ENCOUNTER — Ambulatory Visit (HOSPITAL_COMMUNITY): Admission: RE | Admit: 2018-09-24 | Payer: BC Managed Care – PPO | Source: Ambulatory Visit

## 2018-09-25 ENCOUNTER — Inpatient Hospital Stay: Payer: BC Managed Care – PPO | Attending: Gynecologic Oncology | Admitting: Gynecologic Oncology

## 2018-10-29 IMAGING — CT CT ABD-PELV W/ CM
2 of 4 series · 16 of 46 positions shown, 18 images · IV contrast (Isovue)
Comparison: PET-CT dated 06/21/2016

CLINICAL DATA: 46-year-old female with dysuria and lower back pain.
Recurrent UTI and pyelonephritis. History of cervical cancer.

EXAM:
CT ABDOMEN AND PELVIS WITH CONTRAST
TECHNIQUE: Multidetector CT imaging of the abdomen and pelvis was performed
using the standard protocol following bolus administration of
intravenous contrast.
CONTRAST:  100mL GCTHWB-Q33 IOPAMIDOL (GCTHWB-Q33) INJECTION 61%

[Series 2: axial st · axial · 0.72mm/px · z∈[+734,+1174]mm · 13 of 98 slices shown, 15 images]
[im 5/98  soft-tissue]
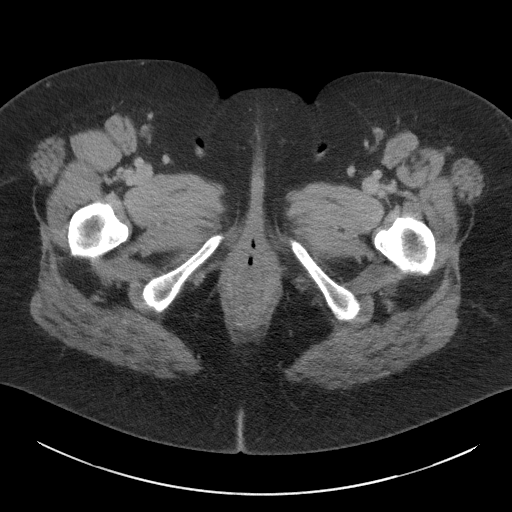
[im 5/98  bone]
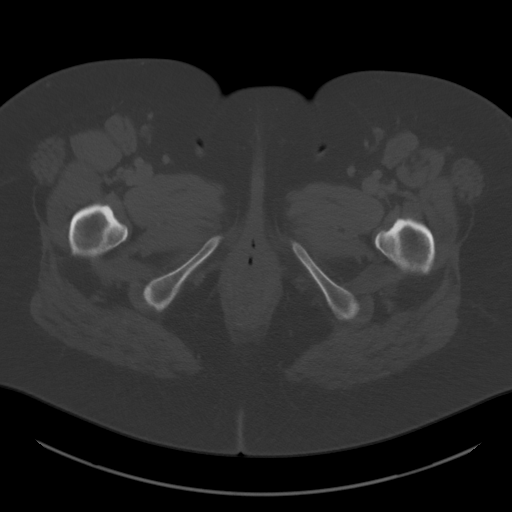
[im 15/98  soft-tissue]
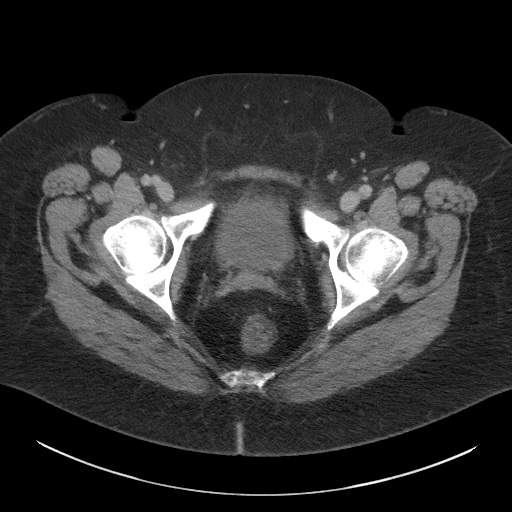
[im 20/98  soft-tissue]
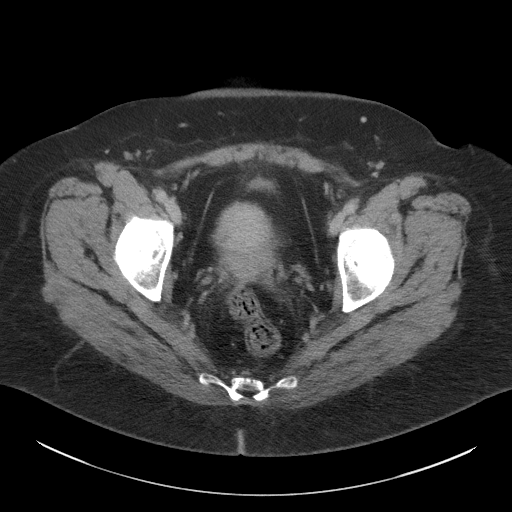
[im 30/98  soft-tissue]
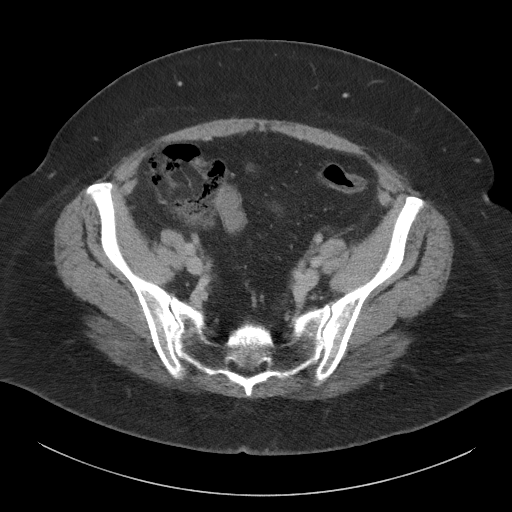
[im 34/98  soft-tissue]
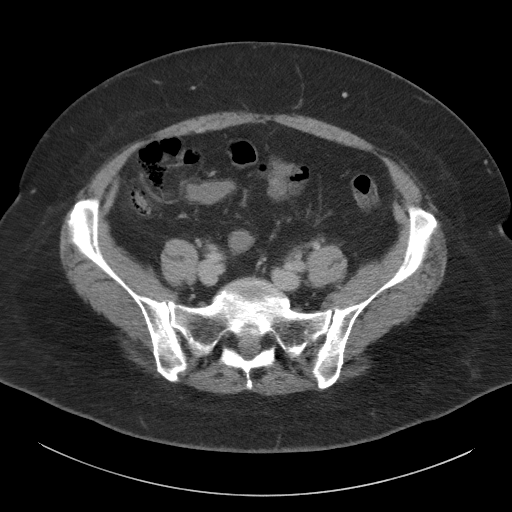
[im 44/98  soft-tissue]
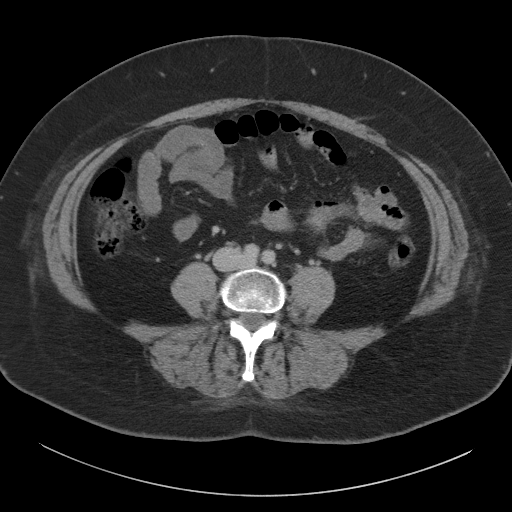
[im 49/98  soft-tissue]
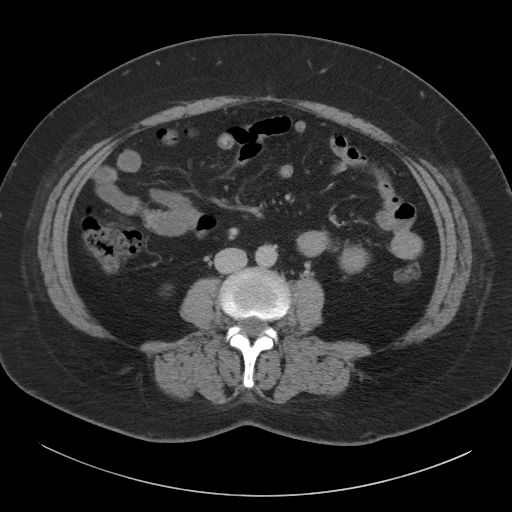
[im 54/98  soft-tissue]
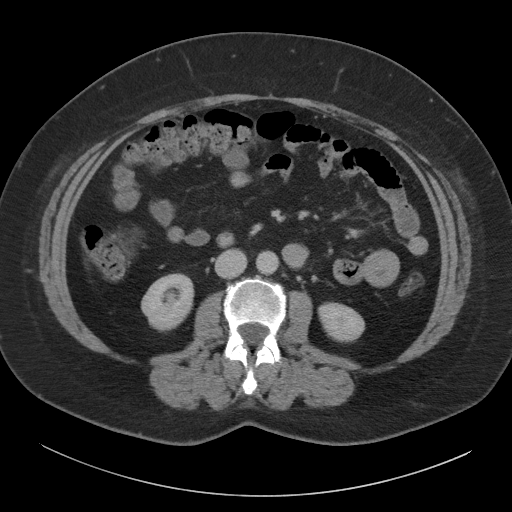
[im 64/98  soft-tissue]
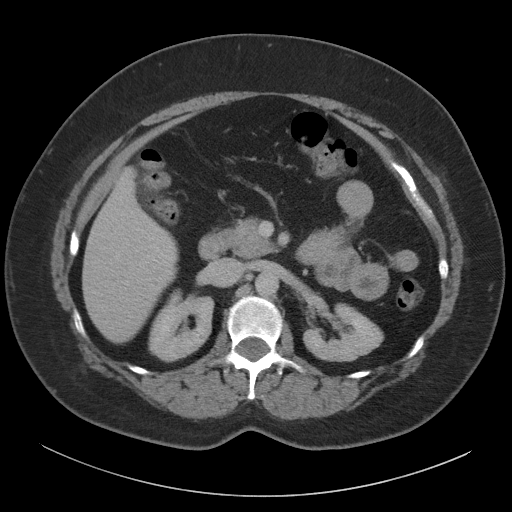
[im 64/98  bone]
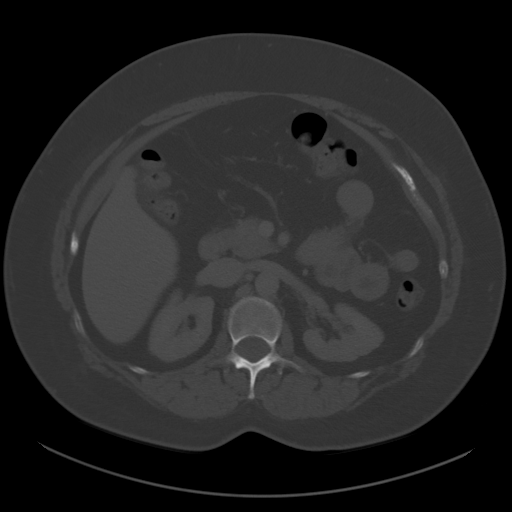
[im 68/98  soft-tissue]
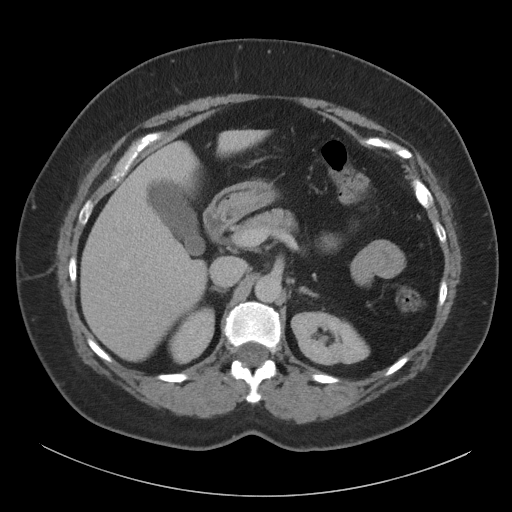
[im 78/98  soft-tissue]
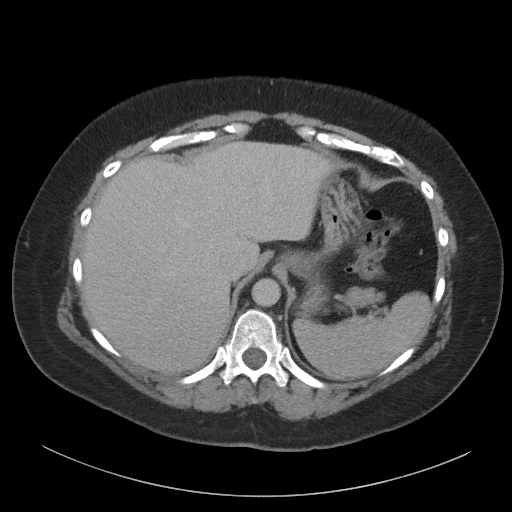
[im 83/98  soft-tissue]
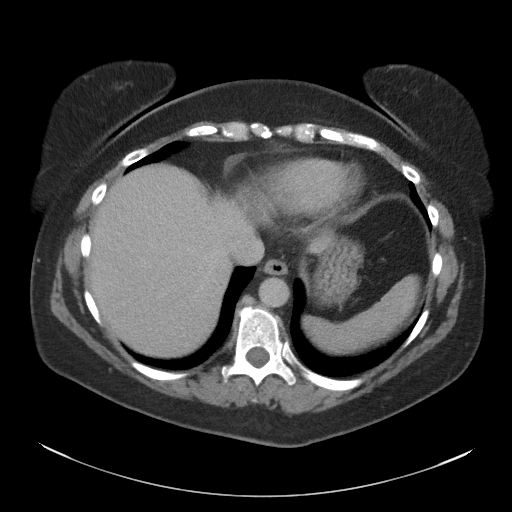
[im 93/98  soft-tissue]
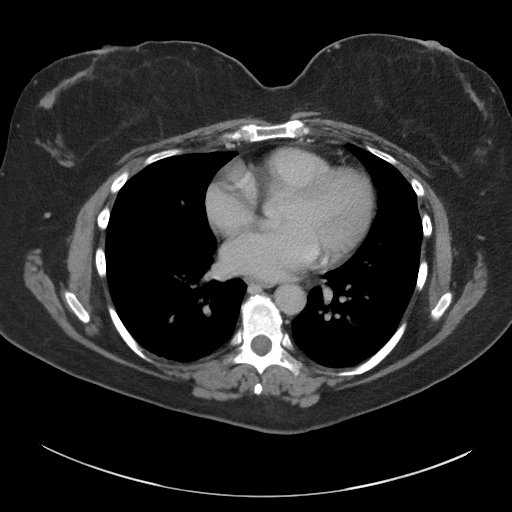

[Series 5: coronal st · coronal · 0.85mm/px · 3 of 116 slices shown]
[im 39/116  soft-tissue]
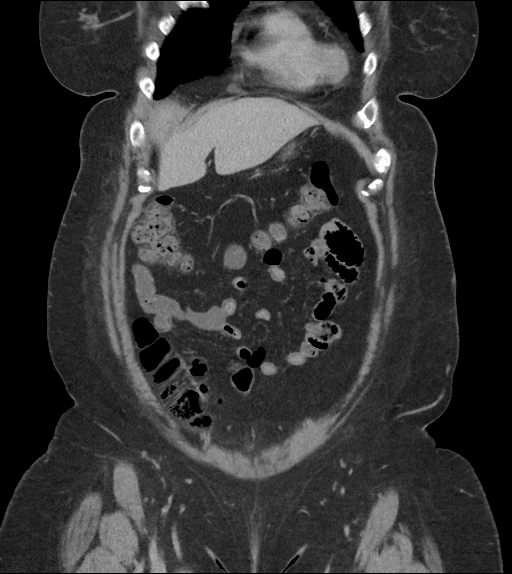
[im 52/116  soft-tissue]
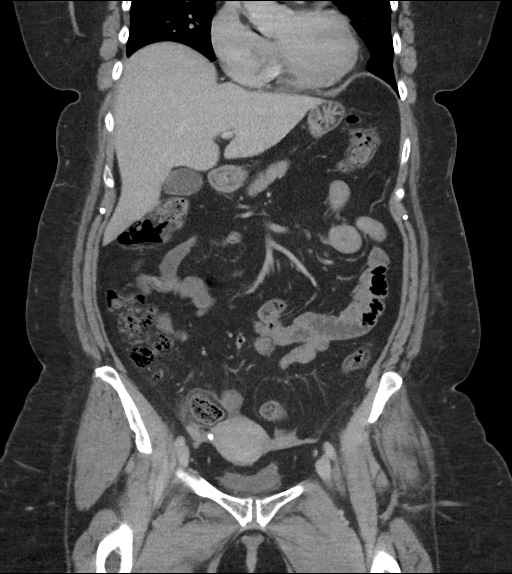
[im 64/116  soft-tissue]
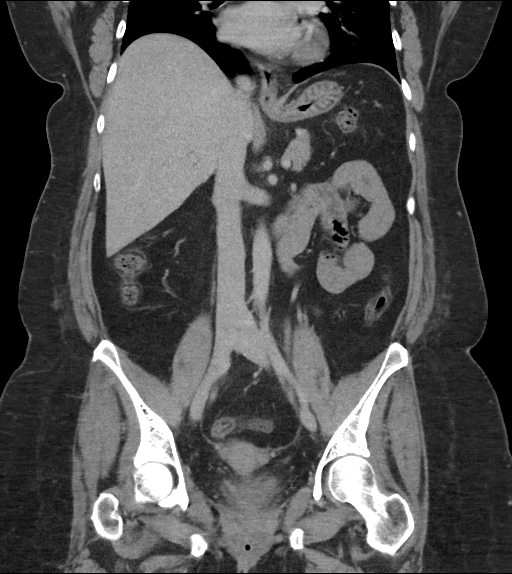

[16 of 46 positions shown; findings below may reference images not displayed]

FINDINGS: Lower chest: The visualized lung bases are clear.

No intra-abdominal free air or free fluid.

Hepatobiliary: No focal liver abnormality is seen. No gallstones,
gallbladder wall thickening, or biliary dilatation.

Pancreas: Unremarkable. No pancreatic ductal dilatation or
surrounding inflammatory changes.

Spleen: Normal in size without focal abnormality.

Adrenals/Urinary Tract: The adrenal glands are unremarkable. The
kidneys appear unremarkable as well. There is symmetric enhancement
and excretion of contrast by kidneys bilaterally. There is stranding
of the fat surrounding the distal ureters. There is diffuse
thickened appearance of the bladder wall with mild perivesical
stranding concerning for cystitis. Correlation with urinalysis is
recommended. The urinary bladder is nearly collapsed.

Stomach/Bowel: There is no evidence of bowel obstruction or active
inflammation. There is diffuse submucosal fat deposit along the
colon, likely sequela of chronic inflammation. The appendix is not
visualized, likely surgically absent.

Vascular/Lymphatic: No significant vascular findings are present. No
enlarged abdominal or pelvic lymph nodes.

Reproductive: The uterus is anteverted and grossly unremarkable.
Bilateral tubal ligation clips noted. Evaluation of the uterus and
cervix is limited on CT. The ovaries are grossly unremarkable as
visualized.

Other: None

Musculoskeletal: No acute or significant osseous findings.
IMPRESSION: 1. Thickened appearance of the bladder wall can't stranding of the
distal periureteric fat concerning for UTI. Correlation with
urinalysis recommended. No evidence of pyelonephritis by CT. No
fluid collection or abscess.
2. No bowel obstruction or active inflammation.

## 2019-01-28 ENCOUNTER — Telehealth: Payer: Self-pay | Admitting: *Deleted

## 2019-01-28 NOTE — Telephone Encounter (Signed)
Patient called for a follow up visit. Patient stated that "I'm having spotting again." Patient also answered yes to having a fullness feeling, spotting, and back/pelvic/abdomen pain. Patient denies any bloating feeling or change to her bowel/bladder. Appt made and message sent to [facility] APP for review

## 2019-02-07 ENCOUNTER — Telehealth: Payer: Self-pay

## 2019-02-07 NOTE — Telephone Encounter (Signed)
Gave Lindsey Hughes the [place] appointment for 02-11-19 at 2 pm.  Arrive at [facility] radiology at 1:45 pm to register. She needs to drink 32 oz of water an hour prior to her appointment and not use the bathroom. She will then come over to the cancer center for appointment with Dr. Denman George. Pt verbalized understanding.

## 2019-02-11 ENCOUNTER — Other Ambulatory Visit: Payer: Self-pay

## 2019-02-11 ENCOUNTER — Encounter: Payer: Self-pay | Admitting: Gynecologic Oncology

## 2019-02-11 ENCOUNTER — Ambulatory Visit (HOSPITAL_COMMUNITY)
Admission: RE | Admit: 2019-02-11 | Discharge: 2019-02-11 | Disposition: A | Discharge status: Home / Self Care | Payer: BC Managed Care – PPO | Source: Ambulatory Visit | Attending: Gynecologic Oncology | Admitting: Gynecologic Oncology

## 2019-02-11 ENCOUNTER — Inpatient Hospital Stay: Payer: BC Managed Care – PPO | Attending: Gynecologic Oncology | Admitting: Gynecologic Oncology

## 2019-02-11 VITALS — BP 150/79 | HR 70 | Temp 98.3°F | Resp 18 | Ht 62.0 in | Wt 211.9 lb

## 2019-02-11 DIAGNOSIS — Z79899 Other long term (current) drug therapy: Secondary | ICD-10-CM | POA: Insufficient documentation

## 2019-02-11 DIAGNOSIS — M549 Dorsalgia, unspecified: Secondary | ICD-10-CM | POA: Diagnosis not present

## 2019-02-11 DIAGNOSIS — Z923 Personal history of irradiation: Secondary | ICD-10-CM | POA: Insufficient documentation

## 2019-02-11 DIAGNOSIS — R197 Diarrhea, unspecified: Secondary | ICD-10-CM | POA: Diagnosis not present

## 2019-02-11 DIAGNOSIS — C539 Malignant neoplasm of cervix uteri, unspecified: Secondary | ICD-10-CM | POA: Diagnosis not present

## 2019-02-11 DIAGNOSIS — R159 Full incontinence of feces: Secondary | ICD-10-CM | POA: Insufficient documentation

## 2019-02-11 DIAGNOSIS — N939 Abnormal uterine and vaginal bleeding, unspecified: Secondary | ICD-10-CM | POA: Diagnosis not present

## 2019-02-11 DIAGNOSIS — Z9221 Personal history of antineoplastic chemotherapy: Secondary | ICD-10-CM | POA: Diagnosis not present

## 2019-02-11 NOTE — Progress Notes (Signed)
GYN ONCOLOGY OFFICE VISIT    Referring MD:  Dr. Oren Binet Gretzinger 48 y.o. female  CC:  Chief Complaint  Patient presents with  . Malignant neoplasm of cervix, unspecified site [facility])    Assessment/Plan:  Lindsey Hughes  is a 48 y.o.  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  Complete clinical response including on post-treatment restaging PET in October, 2017. No evidence of recurrence.  1/ Hx of cervical cancer  No clinical evidence of recurrence. Will see her for follow-up in 6 months with pap.  2/Diarrhea and fecal incontinence,  - normal colonoscopy (radiation changes), continue supportive care  3/ Back pain - likely secondary to bone changes from radiation and chronic LBP. Stable symptoms. Continue calcium supplementation.   4/ radiation induced menopause - continue prempro for now given symptoms of menopause are QOL limiting.    HPI: Lindsey Hughes is a very pleasant 20 year with cervical cancer.  The patient presented with report of  a 4 month history of peristent daily vaginal bleeding. She has also had back pain and more recently random urinary incontinence.  She cannot remember when she last had a pap smear (5-10 years ago) but she does recall having a history of abnormal paps in the past, though was not treated for these and feels that she "was never told they needed treatment".   As workup for her bleeding she saw Dr Pamala Hurry. An office endometrial biopsy was performed on 08/12/15 and revealed benign, weakly proliferative endoemetrium. During that office exam the posterior lip of the cervix was not visualized due to positioning of the uterus and body habitus. Passage of the pipelle was difficult due to obstruction at the endocervix. Pap was taken but was not resulted by the time of her scheduled hysterectomy. It later returned as ASC-H, positive for high  risk HPV.  The patient was scheduled for a robotic hysterectomy and on 09/03/15 was taken to the OR. Intraoperative findings were significant for a very abnormal posterior lip of cervix which was nodular and friable. The cervix could be visualized intra-op under general anesthetic. 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 The procedure was aborted and a referral was made to [place].  PET scan on 09/17/15 showed hypermetabolism of a 5.2cm cervical mass and bilateral external iliac nodes with increased FDG uptake suggestive of metastatic disease. Between 10/04/15 to 11/19/15 she received external beam radiation (45 gy in 25 fractions) with radiosensitizing CDDP. Between 11/09/15- 12/07/15 she received intracavitary brachytherapy (HDR) with 27.5Gy given in 5 fractions. She additionally received a pelvic boost of 9 Gy. She tolerated treatment well with no major GI or GU toxicity.  She did develop a Rt UE DVT for which her port was removed and she was started on lovenox and coumadin.  Post treatment re-staging PET scan was performed on 01/27/16 which showed complete clinical response in the previously enlarged and avid pelvic nodes. No residual cervical mass and residual cervical FDG avidity of 4 demonstrating complete clinical response. No new lesions.   In February 2018 she complained of back pain. PET/CT showed no evidence of malgnancy/recurrence. MRI spine showed some bulging lumbar discs. She had PET avidity in the thyroid gland but negative [place].  In February, 2019 she developed back pain again. Repeat PET/Ct showed no evidence of recurrence.   Interval  Hx:  CT abd/pelvis was ordered and performed on 07/24/18 due to symptoms of spotting and back pain. This revealed No definite findings to strongly indicate metastatic disease in the abdomen or pelvis. There was a ill-defined hypovascular area in the left side  of the uterine fundus. This is nonspecific, but favored to represent a small fibroid.  She continues to have diarrhea refractory to stool bulking agents and anti-diarrhea agents. Colonoscopy normal (radiation changes).  She reports back pain and vaginal spotting after sex.  TVUS on 02/11/19 showed a uterus measuring 7 x 3.4 x 4.3 cm with a 2 cm fibroid.  The endometrium was difficult to visualize due to shadowing from the fibroid.  The left and right ovaries were not visualized.  There was a small amount of fluid within the cervical awes and vaginal canal compatible with her history of vaginal bleeding.   Current Meds:  Outpatient Encounter Medications as of 02/11/2019  Medication Sig  . atorvastatin (LIPITOR) 40 MG tablet Take 40 mg by mouth at bedtime.  Marland Kitchen HYDROcodone-acetaminophen (NORCO/VICODIN) 5-325 MG tablet Take 1 tablet by mouth every 12 (twelve) hours as needed.  Marland Kitchen levothyroxine (SYNTHROID) 75 MCG tablet Take 75 mcg by mouth daily.  Marland Kitchen PREMPRO 0.625-2.5 MG tablet TAKE 1 TABLET DAILY  . trimethoprim (TRIMPEX) 100 MG tablet   . venlafaxine XR (EFFEXOR-XR) 37.5 MG 24 hr capsule Take 1 capsule daily with breakfast. For hotflashes (Patient taking differently: 75 mg. Take 1 capsule daily with breakfast. For hotflashes)  . [DISCONTINUED] ELIQUIS STARTER PACK (ELIQUIS STARTER PACK) 5 MG TABS Take as directed on package: start with two-5mg  tablets twice daily for 7 days. On day 8, switch to one-5mg  tablet twice daily.  . [DISCONTINUED] levothyroxine (SYNTHROID, LEVOTHROID) 50 MCG tablet Take 50 mcg by mouth daily.    No facility-administered encounter medications on file as of 02/11/2019.     Allergy:  Allergies  Allergen Reactions  . Zofran [Ondansetron Hcl] Other (See Comments)    headache    Social Hx:   Social History   Socioeconomic History  . Marital status: Married    Spouse name: Not on file  . Number of children: 1  . Years of education: Not on file  . Highest education  level: Not on file  Occupational History  . Not on file  Social Needs  . Financial resource strain: Not on file  . Food insecurity    Worry: Not on file    Inability: Not on file  . Transportation needs    Medical: Not on file    Non-medical: Not on file  Tobacco Use  . Smoking status: Never Smoker  . Smokeless tobacco: Never Used  Substance and Sexual Activity  . Alcohol use: No    Alcohol/week: 0.0 standard drinks  . Drug use: No  . Sexual activity: Yes    Birth control/protection: Surgical  Lifestyle  . Physical activity    Days per week: Not on file    Minutes per session: Not on file  . Stress: Not on file  Relationships  . Social Herbalist on phone: Not on file    Gets together: Not on file    Attends religious service: Not on file    Active member of club or organization: Not on file    Attends meetings of clubs or organizations: Not on file    Relationship status: Not on file  . Intimate partner violence    Fear of current or ex  partner: Not on file    Emotionally abused: Not on file    Physically abused: Not on file    Forced sexual activity: Not on file  Other Topics Concern  . Not on file  Social History Narrative  . Not on file    Past Surgical Hx:  Past Surgical History:  Procedure Laterality Date  . CARPAL TUNNEL RELEASE Bilateral 2002 and 2009  . CESAREAN SECTION  1991   . DILATION AND CURETTAGE OF UTERUS N/A 09/03/2015   Procedure: EXAM UNDER ANESTHESIA  WITH FROZEN SECTION CERVICAL BIOPSY & ENDOCERVICAL CURRETTINGS;  Surgeon: Aloha Gell, MD;  Location: [facility];  Service: Gynecology;  Laterality: N/A;  . ECHO STRESS TEST  12-10-2014   normal w/ no evidence for stress-induced ischemia/  normal LV function and wall motion , ef 65-70%  . IR GENERIC HISTORICAL  10/25/2015   IR RADIOLOGIST EVAL & MGMT 10/25/2015 Corrie Mckusick, DO [facility]  . IR GENERIC HISTORICAL  12/20/2015   IR REMOVAL TUN ACCESS W/ PORT W/O FL MOD SED 12/20/2015 Sandi Mariscal, MD [facility]  . LASIK Left 2015  . PORTACATH PLACEMENT  10-11-2015  . TANDEM RING INSERTION N/A 11/09/2015   Procedure: TANDEM RING INSERTION;  Surgeon: Gery Pray, MD;  [facility];  Service: Urology;  Laterality: N/A;  . TANDEM RING INSERTION N/A 11/16/2015   Procedure: TANDEM RING INSERTION;  Surgeon: Gery Pray, MD;  Location: [facility];  Service: Urology;  Laterality: N/A;  . TANDEM RING INSERTION N/A 11/23/2015   Procedure: TANDEM RING INSERTION;  Surgeon: Gery Pray, MD;  Location: [facility];  Service: Urology;  Laterality: N/A;  . TANDEM RING INSERTION N/A 11/30/2015   Procedure: TANDEM RING INSERTION;  Surgeon: Gery Pray, MD;  Location: [facility];  Service: Urology;  Laterality: N/A;  . TANDEM RING INSERTION N/A 12/07/2015   Procedure: TANDEM RING INSERTION;  Surgeon: Gery Pray, MD;  Location: [facility];  Service: Urology;  Laterality: N/A;  . TUBAL LIGATION  1996    Past Medical Hx:  Past Medical History:  Diagnosis Date  . Anemia   . Anticoagulated on Coumadin    started 11-26-2015 for right upper arm deep and superficial vein thrombus  . Cervical cancer [facility]) oncologist-  dr livesay/  dr kinard/  dr Denman George   dx 08/2015-- Stage IIB poorly differentiated SCC--  chemotherapy weekly (started 10-04-2015)  and external radiation (started 10-05-2015) and planned high-dose radiation via tandom ring direct to cervix to start 11-09-2015  . Chemotherapy induced diarrhea   . Chemotherapy-induced nausea   . Deep vein thrombosis, upper right extremity ([place]) 11-25-2015 dx   deep and superficial involving axillary vein, brachial vein, and basilic vein  . Depression   . Dysuria   . GERD (gastroesophageal reflux disease)    caused by chemotherapy  . Headache   . History of thrombophlebitis RESOLVED-- but per pt prefer no bp or  puncture done in right arm   per pt symptoms  10-20-2015-- on 10-22-2015 dx Superficial thrombus RUE of right basilic vein,where IV access was difficult for first peripheral chemo (not deep venous system) / symptoms resolved with local measures  . Hypothyroidism   . Port-A-Cath in place   . Radiation    currently having radiation therapy started 10-04-2015  . Radiation 10/04/15-11/19/15   pelvis 45 Gy, boost to 9 Gy  . Radiation 11/09/15-12/07/15   bracytherapy cervical - 27.5 Gy in 5  fractions  . Superficial thrombophlebitis of arm 11/25/2015   right upper arm-- secondary to IV access difficult for first chemo    Past Gynecological History:  G1P1, + abnormal pap smears remotely. No recent pap history in 5-10 years.  No LMP recorded. (Menstrual status: Chemotherapy).  Family Hx:  Family History  Problem Relation Age of Onset  . CAD Mother        Premature disease  . Lung cancer Mother   . Lung cancer Father   . Brain cancer Father   . CAD Brother        MI in his 53s    Review of Systems:  Constitutional  + hot flashes  ENT Normal appearing ears and nares bilaterally Cardiovascular  No chest pain, shortness of breath, or edema  Pulmonary  No cough or wheeze.  Gastro Intestinal  No nausea, vomitting, or diarrhoea. No bright red blood per rectum, no abdominal pain, change in bowel movement, or constipation. + fecal incontinence and loose stools x 12/day.  Diarrhea precipitated  by gustation. Genito Urinary  No frequency, urgency, dysuria, no bleeding Musculo Skeletal  No myalgia, arthralgia, Neurologic  No weakness, numbness, change in gait,  Psychology  No depression, anxiety, insomnia.   Vitals:  Blood pressure (!) 150/79, pulse 70, temperature 98.3 F (36.8 C), temperature source Tympanic, resp. rate 18, height 5\' 2"  (1.575 m), weight 211 lb 14.4 oz (96.1 kg), SpO2 100 %.  Physical Exam: WD in NAD Neck  Supple NROM, without any enlargements. Oral candidiasis noted. Lymph Node Survey No cervical  supraclavicular or inguinal adenopathy Cardiovascular  Pulse normal rate, regularity and rhythm. S1 and S2 normal.  Lungs  Clear to auscultation bilateraly, without wheezes/crackles/rhonchi. Good air movement.  Skin  No rash/lesions/breakdown  Psychiatry  Alert and oriented to person, place, and time  Abdomen  Normoactive bowel sounds, abdomen soft, non-tender and obese without evidence of hernia.  Back No CVA tenderness Genito Urinary  Vulva/vagina: Normal external female genitalia.  No lesions. No discharge or bleeding.  Bladder/urethra:  No lesions or masses, well supported bladder  Vagina: normal with no lesions  Cervix:Cervix flush with the vaginal vault.  NO lesions, no parmetrial nodularity No palpable or visible tumor.   Uterus: 10cm minimally mobile,   Adnexa: no palpable masses. Rectal  Good tone, no masses no cul de sac nodularity. No blood on glove. No parametrial extension minimally bilateraly Extremities  No bilateral cyanosis, clubbing or edema.   Thereasa Solo, MD  02/11/2019, 4:39 PM

## 2019-02-11 NOTE — Patient Instructions (Signed)
Dr Denman George did not see any evidence for cancer on your scans or on examination.  She recommends using a lot of lubricant when you have sex. It is normal to have a little bleeding after sex.  Please contact Dr Serita Grit office (at [phone]) in January, 2021 to request an appointment with her for April, 2021.

## 2019-04-16 ENCOUNTER — Telehealth: Payer: Self-pay | Admitting: *Deleted

## 2019-04-16 NOTE — Telephone Encounter (Signed)
Attempted to return the patient's call, no answer and unable to leave message. Voice mail full

## 2019-04-28 ENCOUNTER — Telehealth: Payer: Self-pay | Admitting: *Deleted

## 2019-04-28 NOTE — Telephone Encounter (Signed)
Returned patient's call, patient wants to speak to Dr Denman George as soon as possible about something. Tried several times to have the patient give me the information to rely, but patient would not talk with me. Message forwarded to [facility] APP

## 2019-04-30 ENCOUNTER — Telehealth: Payer: Self-pay | Admitting: Gynecologic Oncology

## 2019-04-30 NOTE — Telephone Encounter (Signed)
Patient is applying for disability due to fecal incontinence. Requesting records. I informed her that we would be happy to furnish her with whatever records she needs. She was not able to tell me at this time which records are necessary.  I asked her to call back and speak with Sharyn Lull to make the records request.  Thereasa Solo, MD

## 2019-05-05 ENCOUNTER — Telehealth: Payer: Self-pay | Admitting: *Deleted

## 2019-05-05 NOTE — Telephone Encounter (Signed)
Called and left the patient a message to call the office back. Patient requesting records

## 2019-05-07 ENCOUNTER — Telehealth: Payer: Self-pay | Admitting: *Deleted

## 2019-05-07 NOTE — Telephone Encounter (Signed)
Called and spoke the patient, she is asking for all her medical records from oncology.  Explained that I would transfer to medical records

## 2019-05-19 ENCOUNTER — Ambulatory Visit (HOSPITAL_COMMUNITY)
Admission: RE | Admit: 2019-05-19 | Discharge: 2019-05-19 | Disposition: A | Discharge status: Home / Self Care | Payer: BC Managed Care – PPO | Source: Ambulatory Visit | Attending: Surgery | Admitting: Surgery

## 2019-05-19 ENCOUNTER — Other Ambulatory Visit: Payer: Self-pay

## 2019-05-19 DIAGNOSIS — I82401 Acute embolism and thrombosis of unspecified deep veins of right lower extremity: Secondary | ICD-10-CM | POA: Insufficient documentation

## 2019-05-19 DIAGNOSIS — I824Y9 Acute embolism and thrombosis of unspecified deep veins of unspecified proximal lower extremity: Secondary | ICD-10-CM

## 2019-05-20 ENCOUNTER — Other Ambulatory Visit: Payer: Self-pay

## 2019-05-20 ENCOUNTER — Encounter: Payer: Self-pay | Admitting: Vascular Surgery

## 2019-05-20 ENCOUNTER — Ambulatory Visit: Payer: BC Managed Care – PPO | Admitting: Vascular Surgery

## 2019-05-20 VITALS — BP 126/70 | HR 74 | Temp 97.2°F | Resp 20 | Ht 62.0 in | Wt 214.0 lb

## 2019-05-20 DIAGNOSIS — I824Y9 Acute embolism and thrombosis of unspecified deep veins of unspecified proximal lower extremity: Secondary | ICD-10-CM

## 2019-05-20 NOTE — Progress Notes (Signed)
Vascular and Vein Specialist of [facility]  Patient name: Lindsey Hughes MRN: [id number] DOB: 11-12-1970 Sex: female  REASON FOR CONSULT: Discuss history of DVT  HPI: Lindsey Hughes is a 49 y.o. female, who is here today for discussion of DVT.  She has very complex history.  She was diagnosed with cervical cancer and underwent chemotherapy via a right IJ catheter.  She developed right axillary subclavian DVT and was treated with Coumadin.  She had resolution of this following removal of the catheter.  She then presented with right leg swelling which was acute.  This was in November 2019.  DVT study at that time revealed right femoral vein, popliteal and calf vein DVT.  This was unprovoked.  She was appropriately treated with anticoagulation and then this was discontinued.  She recently within the last 2 weeks had an episode of some chest pain and was seen at [facility]  She underwent CT scan of her chest at that point to rule out pulmonary embolus.  It is somewhat confusing but apparently she was told that she did have a pulmonary embolus.  She reports that her discomfort had completely resolved by the time she made it to the emergency department and has had no recurrent symptoms.  She denies any shortness of breath or other pulmonary symptoms and has had no increased swelling.  Past Medical History:  Diagnosis Date  . Anemia   . Anticoagulated on Coumadin    started 11-26-2015 for right upper arm deep and superficial vein thrombus  . Cervical cancer [facility]) oncologist-  dr livesay/  dr kinard/  dr Denman George   dx 08/2015-- Stage IIB poorly differentiated SCC--  chemotherapy weekly (started 10-04-2015)  and external radiation (started 10-05-2015) and planned high-dose radiation via tandom ring direct to cervix to start 11-09-2015  . Chemotherapy induced diarrhea   . Chemotherapy-induced nausea   . Deep vein thrombosis, upper right extremity ([place]) 11-25-2015 dx   deep and superficial involving axillary vein, brachial vein, and basilic vein  . Depression   . DVT (deep venous thrombosis) ([place])   . Dysuria   . GERD (gastroesophageal reflux disease)    caused by chemotherapy  . Headache   . History of thrombophlebitis RESOLVED-- but per pt prefer no bp or  puncture done in right arm   per pt symptoms 10-20-2015-- on 10-22-2015 dx Superficial thrombus RUE of right basilic vein,where IV access was difficult for first peripheral chemo (not deep venous system) / symptoms resolved with local measures  . Hypothyroidism   . Port-A-Cath in place   . Radiation    currently having radiation therapy started 10-04-2015  . Radiation 10/04/15-11/19/15   pelvis 45 Gy, boost to 9 Gy  . Radiation 11/09/15-12/07/15   bracytherapy cervical - 27.5 Gy in 5 fractions  . Superficial thrombophlebitis of arm 11/25/2015   right upper arm-- secondary to IV access difficult for first chemo    Family History  Problem Relation Age of Onset  . CAD Mother        Premature disease  . Lung cancer Mother   . Lung cancer Father   . Brain cancer Father   . CAD Brother        MI in his 46s    SOCIAL HISTORY: Social History   Socioeconomic History  . Marital status: Married    Spouse name: Not on file  . Number of children: 1  . Years of education: Not on file  . Highest education level: Not  on file  Occupational History  . Not on file  Tobacco Use  . Smoking status: Never Smoker  . Smokeless tobacco: Never Used  Substance and Sexual Activity  . Alcohol use: No    Alcohol/week: 0.0 standard drinks  . Drug use: No  . Sexual activity: Yes    Birth control/protection: Surgical  Other Topics Concern  . Not on file  Social History Narrative  . Not on file   Social Determinants of Health   Financial Resource Strain:   . Difficulty of Paying Living Expenses: Not on file  Food Insecurity:   . Worried About Charity fundraiser in the Last Year: Not on file  . Ran Out  of Food in the Last Year: Not on file  Transportation Needs:   . Lack of Transportation (Medical): Not on file  . Lack of Transportation (Non-Medical): Not on file  Physical Activity:   . Days of Exercise per Week: Not on file  . Minutes of Exercise per Session: Not on file  Stress:   . Feeling of Stress : Not on file  Social Connections:   . Frequency of Communication with Friends and Family: Not on file  . Frequency of Social Gatherings with Friends and Family: Not on file  . Attends Religious Services: Not on file  . Active Member of Clubs or Organizations: Not on file  . Attends Archivist Meetings: Not on file  . Marital Status: Not on file  Intimate Partner Violence:   . Fear of Current or Ex-Partner: Not on file  . Emotionally Abused: Not on file  . Physically Abused: Not on file  . Sexually Abused: Not on file    Allergies  Allergen Reactions  . Zofran [Ondansetron Hcl] Other (See Comments)    headache    Current Outpatient Medications  Medication Sig Dispense Refill  . atorvastatin (LIPITOR) 40 MG tablet Take 40 mg by mouth at bedtime.    Marland Kitchen ELIQUIS 5 MG TABS tablet TAKE 2 TABLETS TWICE DAILY FOR 1 WEEK THEN 1 TABLET 2 TIMES A DAY 1 WEEK    . HYDROcodone-acetaminophen (NORCO/VICODIN) 5-325 MG tablet Take 1 tablet by mouth every 12 (twelve) hours as needed.    Marland Kitchen levothyroxine (SYNTHROID) 75 MCG tablet Take 75 mcg by mouth daily.    Marland Kitchen PREMPRO 0.625-2.5 MG tablet TAKE 1 TABLET DAILY 28 tablet 0  . trimethoprim (TRIMPEX) 100 MG tablet   10  . venlafaxine XR (EFFEXOR-XR) 37.5 MG 24 hr capsule Take 1 capsule daily with breakfast. For hotflashes (Patient taking differently: 75 mg. Take 1 capsule daily with breakfast. For hotflashes) 30 capsule 6   No current facility-administered medications for this visit.    REVIEW OF SYSTEMS:  [X]  denotes positive finding, [ ]  denotes negative finding Cardiac  Comments:  Chest pain or chest pressure:    Shortness of breath  upon exertion:    Short of breath when lying flat:    Irregular heart rhythm:        Vascular    Pain in calf, thigh, or hip brought on by ambulation:    Pain in feet at night that wakes you up from your sleep:     Blood clot in your veins: x   Leg swelling:  x       Pulmonary    Oxygen at home:    Productive cough:     Wheezing:         Neurologic    Sudden  weakness in arms or legs:     Sudden numbness in arms or legs:     Sudden onset of difficulty speaking or slurred speech:    Temporary loss of vision in one eye:     Problems with dizziness:         Gastrointestinal    Blood in stool:     Vomited blood:         Genitourinary    Burning when urinating:     Blood in urine:        Psychiatric    Major depression:         Hematologic    Bleeding problems:    Problems with blood clotting too easily:        Skin    Rashes or ulcers:        Constitutional    Fever or chills:      PHYSICAL EXAM: Vitals:   05/20/19 1325  BP: 126/70  Pulse: 74  Resp: 20  Temp: (!) 97.2 F (36.2 C)  SpO2: 100%  Weight: 214 lb (97.1 kg)  Height: 5\' 2"  (1.575 m)    GENERAL: The patient is a well-nourished female, in no acute distress. The vital signs are documented above. CARDIOVASCULAR: 2+ radial and 2+ dorsalis pedis pulses bilaterally.  No swelling in her right versus left leg. PULMONARY: There is good air exchange  ABDOMEN: Soft and non-tender  MUSCULOSKELETAL: There are no major deformities or cyanosis. NEUROLOGIC: No focal weakness or paresthesias are detected. SKIN: There are no ulcers or rashes noted.  No hemosiderin deposit or changes of chronic venous hypertension PSYCHIATRIC: The patient has a normal affect.  DATA:  She underwent noninvasive studies in our office yesterday for lower extremity venous duplex studies.  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  No evidence of left leg DVT   I have reviewed her CT scan from  [facility] to rule out pulmonary embolus.  The radiologist report was no evidence of pulmonary embolus.  I do not see any evidence of pulmonary embolus to my review of the films as well.  MEDICAL ISSUES: History of 2 DVTs.  One related to catheter in her right subclavian axillary vein and the other unprovoked in her right leg in November 2019.  She was appropriately treated with anticoagulation for the right leg DVT.  I cannot find any evidence that she had a pulmonary embolus diagnosis on her recent brief episode of chest pain.  I do not see any indication for continued anticoagulation.  She does not have any evidence of diffuse malignancy which would explain a hypercoagulable state.  I explained that she has had 1 unprovoked DVT and appropriately was treated.  I would discontinue her anticoagulation unless she had new thromboembolic events.  She will see [place] again on an as-needed basis   Rosetta Posner, MD [facility] Vascular and Vein Specialists of [facility] Tel [phone] Pager [phone]

## 2019-07-23 ENCOUNTER — Telehealth: Payer: Self-pay | Admitting: *Deleted

## 2019-07-23 NOTE — Telephone Encounter (Signed)
Spoke with patient to inform that Dr. Denman George said it is fine to stop Prempro.  Patient voiced understanding.

## 2019-07-23 NOTE — Telephone Encounter (Signed)
Patient called and stated "I just saw Dr Hartford Poli at [facility] for a blockage from my right hip to ankle. He wanted me to ask my cancer doctor if I needed to stay on Prempro." Explained that I would give the doctor the message and call her back

## 2020-01-01 ENCOUNTER — Other Ambulatory Visit: Payer: Self-pay

## 2020-01-01 ENCOUNTER — Encounter: Payer: Self-pay | Admitting: Gynecologic Oncology

## 2020-01-01 ENCOUNTER — Inpatient Hospital Stay: Payer: BC Managed Care – PPO | Attending: Gynecologic Oncology | Admitting: Gynecologic Oncology

## 2020-01-01 ENCOUNTER — Other Ambulatory Visit (HOSPITAL_COMMUNITY)
Admission: RE | Admit: 2020-01-01 | Discharge: 2020-01-01 | Disposition: A | Discharge status: Home / Self Care | Payer: BC Managed Care – PPO | Source: Ambulatory Visit | Attending: Gynecologic Oncology | Admitting: Gynecologic Oncology

## 2020-01-01 VITALS — BP 124/79 | HR 85 | Temp 98.1°F | Resp 18 | Wt 206.4 lb

## 2020-01-01 DIAGNOSIS — Z8541 Personal history of malignant neoplasm of cervix uteri: Secondary | ICD-10-CM | POA: Diagnosis not present

## 2020-01-01 DIAGNOSIS — K219 Gastro-esophageal reflux disease without esophagitis: Secondary | ICD-10-CM | POA: Insufficient documentation

## 2020-01-01 DIAGNOSIS — E039 Hypothyroidism, unspecified: Secondary | ICD-10-CM | POA: Diagnosis not present

## 2020-01-01 DIAGNOSIS — Z9221 Personal history of antineoplastic chemotherapy: Secondary | ICD-10-CM | POA: Insufficient documentation

## 2020-01-01 DIAGNOSIS — Z923 Personal history of irradiation: Secondary | ICD-10-CM | POA: Insufficient documentation

## 2020-01-01 DIAGNOSIS — Z808 Family history of malignant neoplasm of other organs or systems: Secondary | ICD-10-CM | POA: Diagnosis not present

## 2020-01-01 DIAGNOSIS — Z79899 Other long term (current) drug therapy: Secondary | ICD-10-CM | POA: Diagnosis not present

## 2020-01-01 DIAGNOSIS — C539 Malignant neoplasm of cervix uteri, unspecified: Secondary | ICD-10-CM | POA: Insufficient documentation

## 2020-01-01 DIAGNOSIS — Z801 Family history of malignant neoplasm of trachea, bronchus and lung: Secondary | ICD-10-CM | POA: Insufficient documentation

## 2020-01-01 DIAGNOSIS — R32 Unspecified urinary incontinence: Secondary | ICD-10-CM | POA: Insufficient documentation

## 2020-01-01 DIAGNOSIS — B372 Candidiasis of skin and nail: Secondary | ICD-10-CM | POA: Diagnosis not present

## 2020-01-01 DIAGNOSIS — F329 Major depressive disorder, single episode, unspecified: Secondary | ICD-10-CM | POA: Diagnosis not present

## 2020-01-01 DIAGNOSIS — D6851 Activated protein C resistance: Secondary | ICD-10-CM | POA: Insufficient documentation

## 2020-01-01 DIAGNOSIS — Z86718 Personal history of other venous thrombosis and embolism: Secondary | ICD-10-CM | POA: Insufficient documentation

## 2020-01-01 DIAGNOSIS — B3789 Other sites of candidiasis: Secondary | ICD-10-CM | POA: Insufficient documentation

## 2020-01-01 DIAGNOSIS — Z7901 Long term (current) use of anticoagulants: Secondary | ICD-10-CM | POA: Diagnosis not present

## 2020-01-01 DIAGNOSIS — Z8249 Family history of ischemic heart disease and other diseases of the circulatory system: Secondary | ICD-10-CM | POA: Diagnosis not present

## 2020-01-01 MED ORDER — NYSTATIN 100000 UNIT/GM EX POWD: 1.0000 "application " | Freq: Two times a day (BID) | CUTANEOUS | 0 refills | Status: DC

## 2020-01-01 MED ORDER — FLUCONAZOLE 100 MG PO TABS: 150.0000 mg | ORAL_TABLET | Freq: Once | ORAL | 0 refills | Status: AC

## 2020-01-01 NOTE — Patient Instructions (Signed)
Dr Serita Grit office will call you with your pap smear result.  Use the diflucan once and then continue to use the nystatin powder twice a day until the rash goes away. The rash is from a yeast infection due to the heat.   Please contact Dr Serita Grit office (at [phone]) in January, 2022 to request an appointment with her for March, 2022.

## 2020-01-01 NOTE — Progress Notes (Signed)
GYN ONCOLOGY OFFICE VISIT  Referring MD:  Dr. Oren Binet Mehrer 49 y.o. female  CC:  Chief Complaint  Patient presents with  . Cervical Cancer  . groin rash    Assessment/Plan:  [name]  is a 49 y.o.  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  Complete clinical response including on post-treatment restaging PET in October, 2017. No evidence of recurrence.  1/ Hx of cervical cancer  No clinical evidence of recurrence. Will see her for follow-up in 6 months and then in 12 months with pap. If she remains cancer free at that time, she can be released from oncology follow-up  2/ candida rash in groin - prescribed diflucan and nystatin powder.    HPI: Lindsey Hughes is a very pleasant 32 year with cervical cancer.  The patient presented with report of  a 4 month history of peristent daily vaginal bleeding. She has also had back pain and more recently random urinary incontinence.  She cannot remember when she last had a pap smear (5-10 years ago) but she does recall having a history of abnormal paps in the past, though was not treated for these and feels that she "was never told they needed treatment".   As workup for her bleeding she saw Dr Pamala Hurry. An office endometrial biopsy was performed on 08/12/15 and revealed benign, weakly proliferative endoemetrium. During that office exam the posterior lip of the cervix was not visualized due to positioning of the uterus and body habitus. Passage of the pipelle was difficult due to obstruction at the endocervix. Pap was taken but was not resulted by the time of her scheduled hysterectomy. It later returned as ASC-H, positive for high risk HPV.  The patient was scheduled for a robotic hysterectomy and on 09/03/15 was taken to the OR. Intraoperative findings were significant for a very abnormal posterior lip of cervix which  was nodular and friable. The cervix could be visualized intra-op under general anesthetic. 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 The procedure was aborted and a referral was made to [facility].  PET scan on 09/17/15 showed hypermetabolism of a 5.2cm cervical mass and bilateral external iliac nodes with increased FDG uptake suggestive of metastatic disease. Between 10/04/15 to 11/19/15 she received external beam radiation (45 gy in 25 fractions) with radiosensitizing CDDP. Between 11/09/15- 12/07/15 she received intracavitary brachytherapy (HDR) with 27.5Gy given in 5 fractions. She additionally received a pelvic boost of 9 Gy. She tolerated treatment well with no major GI or GU toxicity.  She did develop a Rt UE DVT for which her port was removed and she was started on lovenox and coumadin.  Post treatment re-staging PET scan was performed on 01/27/16 which showed complete clinical response in the previously enlarged and avid pelvic nodes. No residual cervical mass and residual cervical FDG avidity of 4 demonstrating complete clinical response. No new lesions.   In February 2018 she complained of back pain. PET/CT showed no evidence of malgnancy/recurrence. MRI spine showed some bulging lumbar discs. She had PET avidity in the thyroid gland but negative [place].  In February, 2019 she developed back pain again. Repeat PET/Ct showed no evidence of recurrence.   CT abd/pelvis was ordered and performed on 07/24/18 due to symptoms of spotting and back pain. This revealed No definite findings to strongly indicate  metastatic disease in the abdomen or pelvis. There was a ill-defined hypovascular area in the left side of the uterine fundus. This is nonspecific, but favored to represent a small fibroid.  She continues to have diarrhea refractory to stool bulking agents and anti-diarrhea agents. Colonoscopy normal  (radiation changes).  She reports back pain and vaginal spotting after sex.  TVUS on 02/11/19 showed a uterus measuring 7 x 3.4 x 4.3 cm with a 2 cm fibroid.  The endometrium was difficult to visualize due to shadowing from the fibroid.  The left and right ovaries were not visualized.  There was a small amount of fluid within the cervical awes and vaginal canal compatible with her history of vaginal bleeding.  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 She was placed on life-long eloquis.   She reported symptoms of an itchy rash in her groin x 2 months.  Current Meds:  Outpatient Encounter Medications as of 01/01/2020  Medication Sig  . atorvastatin (LIPITOR) 40 MG tablet Take 40 mg by mouth at bedtime.  Marland Kitchen ELIQUIS 5 MG TABS tablet Take 5 mg by mouth 2 (two) times daily.   Marland Kitchen HYDROcodone-acetaminophen (NORCO) 7.5-325 MG tablet Take 1 tablet by mouth 3 (three) times daily as needed.  Marland Kitchen levothyroxine (SYNTHROID) 88 MCG tablet Take 88 mcg by mouth every morning.  . trimethoprim (TRIMPEX) 100 MG tablet Take 100 mg by mouth daily.   Marland Kitchen venlafaxine XR (EFFEXOR-XR) 75 MG 24 hr capsule Take 75 mg by mouth daily.  . [DISCONTINUED] HYDROcodone-acetaminophen (NORCO/VICODIN) 5-325 MG tablet Take 1 tablet by mouth every 12 (twelve) hours as needed.  . [DISCONTINUED] levothyroxine (SYNTHROID) 75 MCG tablet Take 75 mcg by mouth daily.  . fluconazole (DIFLUCAN) 100 MG tablet Take 1.5 tablets (150 mg total) by mouth once for 1 dose.  . nystatin (MYCOSTATIN/NYSTOP) powder Apply 1 application topically 2 (two) times daily.  . [DISCONTINUED] PREMPRO 0.625-2.5 MG tablet TAKE 1 TABLET DAILY  . [DISCONTINUED] venlafaxine XR (EFFEXOR-XR) 37.5 MG 24 hr capsule Take 1 capsule daily with breakfast. For hotflashes (Patient taking differently: Take 37.5 mg by mouth daily with breakfast. Take 1 capsule daily with breakfast. For hotflashes)   No  facility-administered encounter medications on file as of 01/01/2020.    Allergy:  Allergies  Allergen Reactions  . Zofran [Ondansetron Hcl] Other (See Comments)    headache    Social Hx:   Social History   Socioeconomic History  . Marital status: Married    Spouse name: Not on file  . Number of children: 1  . Years of education: Not on file  . Highest education level: Not on file  Occupational History  . Not on file  Tobacco Use  . Smoking status: Never Smoker  . Smokeless tobacco: Never Used  Vaping Use  . Vaping Use: Never used  Substance and Sexual Activity  . Alcohol use: No    Alcohol/week: 0.0 standard drinks  . Drug use: No  . Sexual activity: Yes    Birth control/protection: Surgical  Other Topics Concern  . Not on file  Social History Narrative  . Not on file   Social Determinants of Health   Financial Resource Strain:   . Difficulty of Paying Living Expenses: Not on file  Food Insecurity:   . Worried About Charity fundraiser in the Last Year: Not on file  . Ran Out of Food in the  Last Year: Not on file  Transportation Needs:   . Lack of Transportation (Medical): Not on file  . Lack of Transportation (Non-Medical): Not on file  Physical Activity:   . Days of Exercise per Week: Not on file  . Minutes of Exercise per Session: Not on file  Stress:   . Feeling of Stress : Not on file  Social Connections:   . Frequency of Communication with Friends and Family: Not on file  . Frequency of Social Gatherings with Friends and Family: Not on file  . Attends Religious Services: Not on file  . Active Member of Clubs or Organizations: Not on file  . Attends Archivist Meetings: Not on file  . Marital Status: Not on file  Intimate Partner Violence:   . Fear of Current or Ex-Partner: Not on file  . Emotionally Abused: Not on file  . Physically Abused: Not on file  . Sexually Abused: Not on file    Past Surgical Hx:  Past Surgical History:   Procedure Laterality Date  . CARPAL TUNNEL RELEASE Bilateral 2002 and 2009  . CESAREAN SECTION  1991   . DILATION AND CURETTAGE OF UTERUS N/A 09/03/2015   Procedure: EXAM UNDER ANESTHESIA  WITH FROZEN SECTION CERVICAL BIOPSY & ENDOCERVICAL CURRETTINGS;  Surgeon: Aloha Gell, MD;  Location: [facility];  Service: Gynecology;  Laterality: N/A;  . ECHO STRESS TEST  12-10-2014   normal w/ no evidence for stress-induced ischemia/  normal LV function and wall motion , ef 65-70%  . IR GENERIC HISTORICAL  10/25/2015   IR RADIOLOGIST EVAL & MGMT 10/25/2015 Corrie Mckusick, DO [facility]  . IR GENERIC HISTORICAL  12/20/2015   IR REMOVAL TUN ACCESS W/ PORT W/O FL MOD SED 12/20/2015 Sandi Mariscal, MD [facility]  . LASIK Left 2015  . PORTACATH PLACEMENT  10-11-2015  . TANDEM RING INSERTION N/A 11/09/2015   Procedure: TANDEM RING INSERTION;  Surgeon: Gery Pray, MD;  Location[facility];  Service: Urology;  Laterality: N/A;  . TANDEM RING INSERTION N/A 11/16/2015   Procedure: TANDEM RING INSERTION;  Surgeon: Gery Pray, MD;  Location: [facility];  Service: Urology;  Laterality: N/A;  . TANDEM RING INSERTION N/A 11/23/2015   Procedure: TANDEM RING INSERTION;  Surgeon: Gery Pray, MD;  Location: [facility];  Service: Urology;  Laterality: N/A;  . TANDEM RING INSERTION N/A 11/30/2015   Procedure: TANDEM RING INSERTION;  Surgeon: Gery Pray, MD;  Location: [facility];  Service: Urology;  Laterality: N/A;  . TANDEM RING INSERTION N/A 12/07/2015   Procedure: TANDEM RING INSERTION;  Surgeon: Gery Pray, MD;  Location: [facility];  Service: Urology;  Laterality: N/A;  . TUBAL LIGATION  1996    Past Medical Hx:  Past Medical History:  Diagnosis Date  . Anemia   . Anticoagulated on Coumadin    started 11-26-2015 for right upper arm deep and superficial vein thrombus  . Cervical cancer [facility]) oncologist-  dr livesay/  dr kinard/  dr  Denman George   dx 08/2015-- Stage IIB poorly differentiated SCC--  chemotherapy weekly (started 10-04-2015)  and external radiation (started 10-05-2015) and planned high-dose radiation via tandom ring direct to cervix to start 11-09-2015  . Chemotherapy induced diarrhea   . Chemotherapy-induced nausea   . Deep vein thrombosis, upper right extremity ([place]) 11-25-2015 dx   deep and superficial involving axillary vein, brachial vein, and basilic vein  . Depression   . DVT (deep  venous thrombosis) ([place])   . Dysuria   . GERD (gastroesophageal reflux disease)    caused by chemotherapy  . Headache   . History of thrombophlebitis RESOLVED-- but per pt prefer no bp or  puncture done in right arm   per pt symptoms 10-20-2015-- on 10-22-2015 dx Superficial thrombus RUE of right basilic vein,where IV access was difficult for first peripheral chemo (not deep venous system) / symptoms resolved with local measures  . Hypothyroidism   . Port-A-Cath in place   . Radiation    currently having radiation therapy started 10-04-2015  . Radiation 10/04/15-11/19/15   pelvis 45 Gy, boost to 9 Gy  . Radiation 11/09/15-12/07/15   bracytherapy cervical - 27.5 Gy in 5 fractions  . Superficial thrombophlebitis of arm 11/25/2015   right upper arm-- secondary to IV access difficult for first chemo    Past Gynecological History:  G1P1, + abnormal pap smears remotely. No recent pap history in 5-10 years.  No LMP recorded. (Menstrual status: Chemotherapy).  Family Hx:  Family History  Problem Relation Age of Onset  . CAD Mother        Premature disease  . Lung cancer Mother   . Lung cancer Father   . Brain cancer Father   . CAD Brother        MI in his 39s    Review of Systems:  Constitutional  + hot flashes  ENT Normal appearing ears and nares bilaterally Cardiovascular  No chest pain, shortness of breath, or edema  Pulmonary  No cough or wheeze.  Gastro Intestinal  No nausea, vomitting, or diarrhoea. No bright  red blood per rectum, no abdominal pain, change in bowel movement, or constipation. + fecal incontinence and loose stools x 12/day.  Diarrhea precipitated  by gustation. Genito Urinary  No frequency, urgency, dysuria, no bleeding Musculo Skeletal  No myalgia, arthralgia, Neurologic  No weakness, numbness, change in gait,  Psychology  No depression, anxiety, insomnia.   Vitals:  Blood pressure 124/79, pulse 85, temperature 98.1 F (36.7 C), temperature source Tympanic, resp. rate 18, weight 206 lb 6.4 oz (93.6 kg), SpO2 100 %.  Physical Exam: WD in NAD Neck  Supple NROM, without any enlargements. Oral candidiasis noted. Lymph Node Survey No cervical supraclavicular or inguinal adenopathy Cardiovascular  Pulse normal rate, regularity and rhythm. S1 and S2 normal.  Lungs  Clear to auscultation bilateraly, without wheezes/crackles/rhonchi. Good air movement.  Skin  + groin rash bilaterally (erythematous) consistent with candida.   Psychiatry  Alert and oriented to person, place, and time  Abdomen  Normoactive bowel sounds, abdomen soft, non-tender and obese without evidence of hernia.  Back No CVA tenderness Genito Urinary  Vulva/vagina: Normal external female genitalia.  No lesions. No discharge or bleeding.  Bladder/urethra:  No lesions or masses, well supported bladder  Vagina: normal with no lesions  Cervix:Cervix flush with the vaginal vault.  NO lesions, no parmetrial nodularity No palpable or visible tumor.   Uterus: 10cm minimally mobile,   Adnexa: no palpable masses. Rectal  Good tone, no masses no cul de sac nodularity. No blood on glove. No parametrial extension minimally bilateraly Extremities  No bilateral cyanosis, clubbing or edema.   Thereasa Solo, MD  01/01/2020, 1:23 PM

## 2020-01-06 ENCOUNTER — Telehealth: Payer: Self-pay

## 2020-01-06 LAB — CYTOLOGY - PAP
Comment: NEGATIVE
Diagnosis: NEGATIVE
Diagnosis: REACTIVE
High risk HPV: NEGATIVE

## 2020-01-06 NOTE — Telephone Encounter (Signed)
Told Ms Hiltunen that the pap smear was normal and HPV High Risk Negative per Joylene John, NP. Pt verbalized understanding.

## 2020-01-23 ENCOUNTER — Telehealth: Payer: Self-pay

## 2020-01-23 DIAGNOSIS — B3789 Other sites of candidiasis: Secondary | ICD-10-CM

## 2020-01-23 MED ORDER — NYSTATIN 100000 UNIT/GM EX POWD: 1.0000 "application " | Freq: Two times a day (BID) | CUTANEOUS | 0 refills | Status: DC

## 2020-01-23 MED ORDER — FLUCONAZOLE 150 MG PO TABS: 150.0000 mg | ORAL_TABLET | Freq: Every day | ORAL | 0 refills | Status: DC

## 2020-01-23 NOTE — Telephone Encounter (Signed)
Pt given diflucan and nystatin powder at visit 01-01-20 for a yeast infection due to the heat. Ms Bjelland is stating that the itching and rash began yesterday and would like a refill on the medications. Will review with Joylene John, NP.

## 2020-01-23 NOTE — Telephone Encounter (Signed)
Told Ms Clavel that the medication noted below was refilled and sent into her pharmacy. Pt verbalized understanding.

## 2020-01-23 NOTE — Telephone Encounter (Signed)
Sent in refills to pharmacy for diflucan and nystatin powder per Melissa Cross,NP. Attempted to call patient. No answer and voice mail full

## 2020-02-12 ENCOUNTER — Other Ambulatory Visit: Payer: Self-pay | Admitting: *Deleted

## 2020-02-12 DIAGNOSIS — B3789 Other sites of candidiasis: Secondary | ICD-10-CM

## 2020-02-12 MED ORDER — NYSTATIN 100000 UNIT/GM EX POWD: 1.0000 "application " | Freq: Two times a day (BID) | CUTANEOUS | 0 refills | Status: DC

## 2020-02-12 MED ORDER — FLUCONAZOLE 150 MG PO TABS: 150.0000 mg | ORAL_TABLET | Freq: Every day | ORAL | 0 refills | Status: DC

## 2020-02-12 NOTE — Telephone Encounter (Signed)
RX for diflucan 150mg  x1 tablet and nystatin powder sent to [facility] per verbal order Joylene John, NP.

## 2020-02-12 NOTE — Telephone Encounter (Signed)
Patient called and requested a refill on her diflucan

## 2020-04-15 ENCOUNTER — Telehealth: Payer: Self-pay

## 2020-04-15 ENCOUNTER — Other Ambulatory Visit: Payer: Self-pay | Admitting: Gynecologic Oncology

## 2020-04-15 DIAGNOSIS — B3789 Other sites of candidiasis: Secondary | ICD-10-CM

## 2020-04-15 MED ORDER — FLUCONAZOLE 150 MG PO TABS: 150.0000 mg | ORAL_TABLET | Freq: Every day | ORAL | 0 refills | Status: DC

## 2020-04-15 NOTE — Telephone Encounter (Signed)
Lindsey Hughes states that she has vaginal itching  and would like the diflucan 150 mg tablet called in.  She continues to use the nystatin powder to her folds.  This is the second refill of diflucan since the original prescription on 01-01-20. Dr. Denman George will give this refill today.  Told Lindsey Hughes that Dr. Denman George stated that she needs to see PCP regarding frequent yeast infections as this is not related to her cancer diagnosis. Pt verbalized understanding.

## 2020-05-03 ENCOUNTER — Ambulatory Visit: Payer: BLUE CROSS/BLUE SHIELD | Admitting: Orthopedic Surgery

## 2020-07-05 ENCOUNTER — Other Ambulatory Visit: Payer: Self-pay | Admitting: Internal Medicine

## 2020-07-05 DIAGNOSIS — Z1231 Encounter for screening mammogram for malignant neoplasm of breast: Secondary | ICD-10-CM

## 2020-08-26 ENCOUNTER — Ambulatory Visit: Payer: BLUE CROSS/BLUE SHIELD

## 2021-01-14 ENCOUNTER — Telehealth: Payer: Self-pay

## 2021-01-14 NOTE — Telephone Encounter (Signed)
NOTES ON FILE FROM [place] [phone], SENT REFERRAL TO [place]

## 2021-02-11 NOTE — Progress Notes (Deleted)
Cardiology Office Note   Date:  02/11/2021   ID:  Lindsey Hughes, DOB June 24, 1970, MRN [id number]  PCP:  Lindsey Sill, NP  Cardiologist:   Lindsey Chaudhuri Martinique, MD   No chief complaint on file.     History of Present Illness: Lindsey Hughes is a 50 y.o. female who is seen at the request of Lindsey Cai NP for evaluation of chest pain. She has a history of DVT. Prior stress Echo in 2016 was normal.     Past Medical History:  Diagnosis Date   Anemia    Anticoagulated on Coumadin    started 11-26-2015 for right upper arm deep and superficial vein thrombus   Cervical cancer [facility]) oncologist-  dr livesay/  dr kinard/  dr Denman George   dx 08/2015-- Stage IIB poorly differentiated SCC--  chemotherapy weekly (started 10-04-2015)  and external radiation (started 10-05-2015) and planned high-dose radiation via tandom ring direct to cervix to start 11-09-2015   Chemotherapy induced diarrhea    Chemotherapy-induced nausea    Deep vein thrombosis, upper right extremity ([place]) 11-25-2015 dx   deep and superficial involving axillary vein, brachial vein, and basilic vein   Depression    DVT (deep venous thrombosis) ([facility])    Dysuria    GERD (gastroesophageal reflux disease)    caused by chemotherapy   Headache    History of thrombophlebitis RESOLVED-- but per pt prefer no bp or  puncture done in right arm   per pt symptoms 10-20-2015-- on 10-22-2015 dx Superficial thrombus RUE of right basilic vein,where IV access was difficult for first peripheral chemo (not deep venous system) / symptoms resolved with local measures   Hypothyroidism    Port-A-Cath in place    Radiation    currently having radiation therapy started 10-04-2015   Radiation 10/04/15-11/19/15   pelvis 45 Gy, boost to 9 Gy   Radiation 11/09/15-12/07/15   bracytherapy cervical - 27.5 Gy in 5 fractions   Superficial thrombophlebitis of arm 11/25/2015   right upper arm-- secondary to IV access difficult for first chemo    Past  Surgical History:  Procedure Laterality Date   CARPAL TUNNEL RELEASE Bilateral 2002 and 2009   CESAREAN SECTION  1991    DILATION AND CURETTAGE OF UTERUS N/A 09/03/2015   Procedure: EXAM UNDER ANESTHESIA  WITH FROZEN SECTION CERVICAL BIOPSY & ENDOCERVICAL CURRETTINGS;  Surgeon: Aloha Gell, MD;  Location: [facility];  Service: Gynecology;  Laterality: N/A;   ECHO STRESS TEST  12-10-2014   normal w/ no evidence for stress-induced ischemia/  normal LV function and wall motion , ef 65-70%   IR GENERIC HISTORICAL  10/25/2015   IR RADIOLOGIST EVAL & MGMT 10/25/2015 Corrie Mckusick, DO [facility]   IR GENERIC HISTORICAL  12/20/2015   IR REMOVAL TUN ACCESS W/ PORT W/O FL MOD SED 12/20/2015 Sandi Mariscal, MD [facility]   LASIK Left 2015   PORTACATH PLACEMENT  10-11-2015   TANDEM RING INSERTION N/A 11/09/2015   Procedure: TANDEM RING INSERTION;  Surgeon: Gery Pray, MD;  Location: [facility];  Service: Urology;  Laterality: N/A;   TANDEM RING INSERTION N/A 11/16/2015   Procedure: TANDEM RING INSERTION;  Surgeon: Gery Pray, MD;  Location: [facility];  Service: Urology;  Laterality: N/A;   TANDEM RING INSERTION N/A 11/23/2015   Procedure: TANDEM RING INSERTION;  Surgeon: Gery Pray, MD;  Location: [facility];  Service: Urology;  Laterality: N/A;   TANDEM RING INSERTION N/A 11/30/2015  Procedure: TANDEM RING INSERTION;  Surgeon: Gery Pray, MD;  Location: [facility];  Service: Urology;  Laterality: N/A;   TANDEM RING INSERTION N/A 12/07/2015   Procedure: TANDEM RING INSERTION;  Surgeon: Gery Pray, MD;  Location: [facility];  Service: Urology;  Laterality: N/A;   TUBAL LIGATION  1996     Current Outpatient Medications  Medication Sig Dispense Refill   atorvastatin (LIPITOR) 40 MG tablet Take 40 mg by mouth at bedtime.     ELIQUIS 5 MG TABS tablet Take 5 mg by mouth 2 (two) times daily.      fluconazole (DIFLUCAN) 150 MG  tablet Take 1 tablet (150 mg total) by mouth daily. 1 tablet 0   HYDROcodone-acetaminophen (NORCO) 7.5-325 MG tablet Take 1 tablet by mouth 3 (three) times daily as needed.     levothyroxine (SYNTHROID) 88 MCG tablet Take 88 mcg by mouth every morning.     nystatin (MYCOSTATIN/NYSTOP) powder Apply 1 application topically 2 (two) times daily. 15 g 0   trimethoprim (TRIMPEX) 100 MG tablet Take 100 mg by mouth daily.   10   venlafaxine XR (EFFEXOR-XR) 75 MG 24 hr capsule Take 75 mg by mouth daily.     No current facility-administered medications for this visit.    Allergies:   Zofran [ondansetron hcl]    Social History:  The patient  reports that she has never smoked. She has never used smokeless tobacco. She reports that she does not drink alcohol and does not use drugs.   Family History:  The patient's ***family history includes Brain cancer in her father; CAD in her brother and mother; Lung cancer in her father and mother.    ROS:  Please see the history of present illness.   Otherwise, review of systems are positive for {NONE DEFAULTED:18576}.   All other systems are reviewed and negative.    PHYSICAL EXAM: VS:  There were no vitals taken for this visit. , BMI There is no height or weight on file to calculate BMI. GEN: Well nourished, well developed, in no acute distress HEENT: normal Neck: no JVD, carotid bruits, or masses Cardiac: ***RRR; no murmurs, rubs, or gallops,no edema  Respiratory:  clear to auscultation bilaterally, normal work of breathing GI: soft, nontender, nondistended, + BS MS: no deformity or atrophy Skin: warm and dry, no rash Neuro:  Strength and sensation are intact Psych: euthymic mood, full affect   EKG:  EKG {ACTION; IS/IS [id number]} ordered today. The ekg ordered today demonstrates ***   Recent Labs: No results found for requested labs within last 8760 hours.   Dated 01/12/21: Hgb 8.5. Plts 433K. BNP 216. Creatinine 1.25. otherwise CMET normal.   Lipid Panel No results found for: CHOL, TRIG, HDL, CHOLHDL, VLDL, LDLCALC, LDLDIRECT    Wt Readings from Last 3 Encounters:  01/01/20 206 lb 6.4 oz (93.6 kg)  05/20/19 214 lb (97.1 kg)  02/11/19 211 lb 14.4 oz (96.1 kg)      Other studies Reviewed: Additional studies/ records that were reviewed today include: . Review of the above records demonstrates:  Stress Echo 12/10/14: Study Conclusions   - Stress ECG conclusions: The stress ECG was negative for ischemia    with lead motion artifact. [facility] scoring: exercise time of 7 min;    maximum ST deviation of 0 mm; no angina; resulting score is 7.    This score predicts a low risk of cardiac events.  - Staged echo: There was no diagnostic evidence for  stress-induced    ischemia.   CTA Chest W Contrast  Anatomical Region Laterality Modality  Chest -- Computed Tomography  Vascular -- --   Impression  1. No CT findings for pulmonary embolism.  2. Normal thoracic aorta.  3. No acute pulmonary findings.    Electronically Signed    By: Marijo Sanes M.D.    On: 01/12/2021 21:29 Narrative  This result has an attachment that is not available.  CLINICAL DATA:  Chest pain for 2 weeks.  History of cervical cancer.   EXAM:  CT ANGIOGRAPHY CHEST WITH CONTRAST   TECHNIQUE:  Multidetector CT imaging of the chest was performed using the  standard protocol during bolus administration of intravenous  contrast. Multiplanar CT image reconstructions and MIPs were  obtained to evaluate the vascular anatomy.   COMPARISON:  05/11/2019   FINDINGS:  Cardiovascular: The heart is normal in size. No pericardial  effusion. The aorta is normal in caliber. No dissection. No  atherosclerotic calcifications. The branch vessels are patent. No  definite coronary artery calcifications.   The pulmonary arterial tree is fairly well opacified. No filling  defects to suggest pulmonary embolism.   Mediastinum/Nodes: No mediastinal or hilar mass or  lymphadenopathy.  The esophagus is grossly normal. Thyroid gland is unremarkable.   Lungs/Pleura: No pulmonary lesions or pulmonary nodules. No  infiltrates or effusions. No interstitial lung disease or  bronchiectasis.   Upper Abdomen: No significant upper abdominal findings.   Musculoskeletal: No breast masses, supraclavicular or axillary  adenopathy. The bony thorax is intact.   Review of the MIP images confirms the above findings. Procedure Note  Pricilla Riffle, MD - 01/12/2021  ASSESSMENT AND PLAN:  1.  ***   Current medicines are reviewed at length with the patient today.  The patient {ACTIONS; HAS/DOES NOT HAVE:19233} concerns regarding medicines.  The following changes have been made:  {PLAN; NO CHANGE:13088:s}  Labs/ tests ordered today include: *** No orders of the defined types were placed in this encounter.    Disposition:   FU with *** in {gen number 6-20:[id number]} {Days to years:10300}  Signed, Jiaire Rosebrook Martinique, MD  02/11/2021 1:20 PM    [facility] [address], [place], [place], [address] Phone [phone], Fax [phone]

## 2021-02-14 ENCOUNTER — Ambulatory Visit: Payer: BLUE CROSS/BLUE SHIELD | Admitting: Cardiology

## 2021-02-24 ENCOUNTER — Ambulatory Visit: Payer: BLUE CROSS/BLUE SHIELD | Admitting: Cardiovascular Disease

## 2021-03-21 ENCOUNTER — Telehealth: Payer: Self-pay | Admitting: *Deleted

## 2021-03-21 NOTE — Telephone Encounter (Signed)
Patient called and scheduled a follow up appt with Dr Berline Lopes on 1/9

## 2021-04-05 ENCOUNTER — Encounter: Payer: Self-pay | Admitting: Gynecologic Oncology

## 2021-04-06 ENCOUNTER — Ambulatory Visit: Payer: BLUE CROSS/BLUE SHIELD | Admitting: Gynecologic Oncology

## 2021-04-06 DIAGNOSIS — C539 Malignant neoplasm of cervix uteri, unspecified: Secondary | ICD-10-CM

## 2021-04-06 NOTE — Progress Notes (Unsigned)
Gynecologic Oncology Return Clinic Visit  04/06/2021  Reason for Visit: Surveillance visit in the setting of history of cervical cancer  Treatment History: The patient presented with report of  a 4 month history of peristent daily vaginal bleeding. She has also had back pain and more recently random urinary incontinence.   She cannot remember when she last had a pap smear (5-10 years ago) but she does recall having a history of abnormal paps in the past, though was not treated for these and feels that she "was never told they needed treatment".    As workup for her bleeding she saw Dr Pamala Hurry. An office endometrial biopsy was performed on 08/12/15 and revealed benign, weakly proliferative endoemetrium. During that office exam the posterior lip of the cervix was not visualized due to positioning of the uterus and body habitus. Passage of the pipelle was difficult due to obstruction at the endocervix. Pap was taken but was not resulted by the time of her scheduled hysterectomy. It later returned as ASC-H, positive for high risk HPV.   The patient was scheduled for a robotic hysterectomy and on 09/03/15 was taken to the OR. Intraoperative findings were significant for a very abnormal posterior lip of cervix which was nodular and friable. The cervix could be visualized intra-op under general anesthetic. 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 The procedure was aborted and a referral was made to [facility].   PET scan on 09/17/15 showed hypermetabolism of a 5.2cm cervical mass and bilateral external iliac nodes with increased FDG uptake suggestive of metastatic disease. Between 10/04/15 to 11/19/15 she received external beam radiation (45 gy in 25 fractions) with radiosensitizing CDDP. Between 11/09/15- 12/07/15 she received intracavitary brachytherapy (HDR) with 27.5Gy given in 5 fractions. She additionally  received a pelvic boost of 9 Gy. She tolerated treatment well with no major GI or GU toxicity.  She did develop a Rt UE DVT for which her port was removed and she was started on lovenox and coumadin.   Post treatment re-staging PET scan was performed on 01/27/16 which showed complete clinical response in the previously enlarged and avid pelvic nodes. No residual cervical mass and residual cervical FDG avidity of 4 demonstrating complete clinical response. No new lesions.    In February 2018 she complained of back pain. PET/CT showed no evidence of malgnancy/recurrence. MRI spine showed some bulging lumbar discs. She had PET avidity in the thyroid gland but negative [place].   In February, 2019 she developed back pain again. Repeat PET/Ct showed no evidence of recurrence.    CT abd/pelvis was ordered and performed on 07/24/18 due to symptoms of spotting and back pain. This revealed No definite findings to strongly indicate metastatic disease in the abdomen or pelvis. There was a ill-defined hypovascular area in the left side of the uterine fundus. This is nonspecific, but favored to represent a small fibroid.   She continues to have diarrhea refractory to stool bulking agents and anti-diarrhea agents. Colonoscopy normal (radiation changes).  She reports back pain and vaginal spotting after sex.  TVUS on 02/11/19 showed a uterus measuring 7 x 3.4 x 4.3 cm with a 2 cm fibroid.  The endometrium was difficult to visualize due to shadowing from the fibroid.  The left and right ovaries were not visualized.  There was a small amount of fluid within the cervical awes and vaginal canal compatible with her history of vaginal bleeding.  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 She was placed on life-long eloquis.   Interval History: ***  Past Medical/Surgical History: Past Medical History:  Diagnosis Date   Anemia    Anticoagulated on Coumadin     started 11-26-2015 for right upper arm deep and superficial vein thrombus   Cervical cancer [facility]) oncologist-  dr livesay/  dr kinard/  dr Denman George   dx 08/2015-- Stage IIB poorly differentiated SCC--  chemotherapy weekly (started 10-04-2015)  and external radiation (started 10-05-2015) and planned high-dose radiation via tandom ring direct to cervix to start 11-09-2015   Chemotherapy induced diarrhea    Chemotherapy-induced nausea    Deep vein thrombosis, upper right extremity ([place]) 11-25-2015 dx   deep and superficial involving axillary vein, brachial vein, and basilic vein   Depression    DVT (deep venous thrombosis) (HCC)    Dysuria    GERD (gastroesophageal reflux disease)    caused by chemotherapy   Headache    History of thrombophlebitis RESOLVED-- but per pt prefer no bp or  puncture done in right arm   per pt symptoms 10-20-2015-- on 10-22-2015 dx Superficial thrombus RUE of right basilic vein,where IV access was difficult for first peripheral chemo (not deep venous system) / symptoms resolved with local measures   Hypothyroidism    Port-A-Cath in place    Radiation    currently having radiation therapy started 10-04-2015   Radiation 10/04/15-11/19/15   pelvis 45 Gy, boost to 9 Gy   Radiation 11/09/15-12/07/15   bracytherapy cervical - 27.5 Gy in 5 fractions   Superficial thrombophlebitis of arm 11/25/2015   right upper arm-- secondary to IV access difficult for first chemo    Past Surgical History:  Procedure Laterality Date   CARPAL TUNNEL RELEASE Bilateral 2002 and 2009   CESAREAN SECTION  1991    DILATION AND CURETTAGE OF UTERUS N/A 09/03/2015   Procedure: EXAM UNDER ANESTHESIA  WITH FROZEN SECTION CERVICAL BIOPSY & ENDOCERVICAL CURRETTINGS;  Surgeon: Aloha Gell, MD;  Location: [facility];  Service: Gynecology;  Laterality: N/A;   ECHO STRESS TEST  12-10-2014   normal w/ no evidence for stress-induced ischemia/  normal LV function and wall motion , ef 65-70%   IR GENERIC  HISTORICAL  10/25/2015   IR RADIOLOGIST EVAL & MGMT 10/25/2015 Corrie Mckusick, DO [facility]   IR GENERIC HISTORICAL  12/20/2015   IR REMOVAL TUN ACCESS W/ PORT W/O FL MOD SED 12/20/2015 Sandi Mariscal, MD [facility]   LASIK Left 2015   PORTACATH PLACEMENT  10-11-2015   TANDEM RING INSERTION N/A 11/09/2015   Procedure: TANDEM RING INSERTION;  Surgeon: Gery Pray, MD;  Location: [facility];  Service: Urology;  Laterality: N/A;   TANDEM RING INSERTION N/A 11/16/2015   Procedure: TANDEM RING INSERTION;  Surgeon: Gery Pray, MD;  Location: [facility];  Service: Urology;  Laterality: N/A;   TANDEM RING INSERTION N/A 11/23/2015   Procedure: TANDEM RING INSERTION;  Surgeon: Gery Pray, MD;  Location: [facility];  Service: Urology;  Laterality: N/A;   TANDEM RING INSERTION N/A 11/30/2015   Procedure: TANDEM RING INSERTION;  Surgeon: Gery Pray, MD;  Location: [facility];  Service: Urology;  Laterality: N/A;   TANDEM RING INSERTION N/A 12/07/2015   Procedure: TANDEM RING INSERTION;  Surgeon: Gery Pray, MD;  Location: [facility];  Service: Urology;  Laterality: N/A;  TUBAL LIGATION  1996    Family History  Problem Relation Age of Onset   CAD Mother        Premature disease   Lung cancer Mother    Lung cancer Father    Brain cancer Father    CAD Brother        MI in his 23s    Social History   Socioeconomic History   Marital status: Married    Spouse name: Not on file   Number of children: 1   Years of education: Not on file   Highest education level: Not on file  Occupational History   Not on file  Tobacco Use   Smoking status: Never   Smokeless tobacco: Never  Vaping Use   Vaping Use: Never used  Substance and Sexual Activity   Alcohol use: No    Alcohol/week: 0.0 standard drinks   Drug use: No   Sexual activity: Yes    Birth control/protection: Surgical  Other Topics Concern   Not on file   Social History Narrative   Not on file   Social Determinants of Health   Financial Resource Strain: Not on file  Food Insecurity: Not on file  Transportation Needs: Not on file  Physical Activity: Not on file  Stress: Not on file  Social Connections: Not on file    Current Medications:  Current Outpatient Medications:    atorvastatin (LIPITOR) 40 MG tablet, Take 40 mg by mouth at bedtime., Disp: , Rfl:    ELIQUIS 5 MG TABS tablet, Take 5 mg by mouth 2 (two) times daily. , Disp: , Rfl:    fluconazole (DIFLUCAN) 150 MG tablet, Take 1 tablet (150 mg total) by mouth daily., Disp: 1 tablet, Rfl: 0   HYDROcodone-acetaminophen (NORCO) 7.5-325 MG tablet, Take 1 tablet by mouth 3 (three) times daily as needed., Disp: , Rfl:    levothyroxine (SYNTHROID) 88 MCG tablet, Take 88 mcg by mouth every morning., Disp: , Rfl:    trimethoprim (TRIMPEX) 100 MG tablet, Take 100 mg by mouth daily. , Disp: , Rfl: 10   venlafaxine XR (EFFEXOR-XR) 75 MG 24 hr capsule, Take 75 mg by mouth daily., Disp: , Rfl:    zolpidem (AMBIEN) 10 MG tablet, 1 tablet at bedtime as needed, Disp: , Rfl:    nystatin (MYCOSTATIN/NYSTOP) powder, Apply 1 application topically 2 (two) times daily. (Patient not taking: Reported on 04/05/2021), Disp: 15 g, Rfl: 0  Review of Systems: Denies appetite changes, fevers, chills, fatigue, unexplained weight changes. Denies hearing loss, neck lumps or masses, mouth sores, ringing in ears or voice changes. Denies cough or wheezing.  Denies shortness of breath. Denies chest pain or palpitations. Denies leg swelling. Denies abdominal distention, pain, blood in stools, constipation, diarrhea, nausea, vomiting, or early satiety. Denies pain with intercourse, dysuria, frequency, hematuria or incontinence. Denies hot flashes, pelvic pain, vaginal bleeding or vaginal discharge.   Denies joint pain, back pain or muscle pain/cramps. Denies itching, rash, or wounds. Denies dizziness, headaches,  numbness or seizures. Denies swollen lymph nodes or glands, denies easy bruising or bleeding. Denies anxiety, depression, confusion, or decreased concentration.  Physical Exam: There were no vitals taken for this visit. General: ***Alert, oriented, no acute distress. HEENT: ***Posterior oropharynx clear, sclera anicteric. Chest: ***Clear to auscultation bilaterally.  ***Port site clean. Cardiovascular: ***Regular rate and rhythm, no murmurs. Abdomen: ***Obese, soft, nontender.  Normoactive bowel sounds.  No masses or hepatosplenomegaly appreciated.  ***Well-healed scar. Extremities: ***Grossly normal range of motion.  Warm, well perfused.  No edema bilaterally. Skin: ***No rashes or lesions noted. Lymphatics: ***No cervical, supraclavicular, or inguinal adenopathy. GU: Normal appearing external genitalia without erythema, excoriation, or lesions.  Speculum exam reveals ***.  Bimanual exam reveals ***.  ***Rectovaginal exam  confirms ___.  Laboratory & Radiologic Studies: ***  Assessment & Plan: Lindsey Hughes is a 50 y.o. 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  Complete clinical response including on post-treatment restaging PET in October, 2017. No evidence of recurrence.   1/ Hx of cervical cancer  No clinical evidence of recurrence. Will see her for follow-up in 6 months and then in 12 months with pap. If she remains cancer free at that time, she can be released from oncology follow-up   2/ candida rash in groin - prescribed diflucan and nystatin powder.   *** minutes of total time was spent for this patient encounter, including preparation, face-to-face counseling with the patient and coordination of care, and documentation of the encounter.  Jeral Pinch, MD  Division of Gynecologic Oncology  Department of Obstetrics and Gynecology  [facility]

## 2021-04-06 NOTE — Patient Instructions (Signed)
It was nice to meet you today.  The office will contact you once we have your Pap smear results back.  Given that you are now more than 5 years out from your cancer diagnosis, you are released to see a gynecologist yearly.  If you develop any concerning symptoms, such as vaginal bleeding, discharge, pelvic pain, unintentional weight loss, or change to bowel function, please call to be seen sooner.

## 2021-04-19 NOTE — Progress Notes (Signed)
Gynecologic Oncology Return Clinic Visit  04/20/21  Reason for Visit: Surveillance visit in the setting of history of cervical cancer   Treatment History: The patient presented with report of  a 4 month history of peristent daily vaginal bleeding. She has also had back pain and more recently random urinary incontinence.   She cannot remember when she last had a pap smear (5-10 years ago) but she does recall having a history of abnormal paps in the past, though was not treated for these and feels that she "was never told they needed treatment".    As workup for her bleeding she saw Dr Pamala Hurry. An office endometrial biopsy was performed on 08/12/15 and revealed benign, weakly proliferative endoemetrium. During that office exam the posterior lip of the cervix was not visualized due to positioning of the uterus and body habitus. Passage of the pipelle was difficult due to obstruction at the endocervix. Pap was taken but was not resulted by the time of her scheduled hysterectomy. It later returned as ASC-H, positive for high risk HPV.   The patient was scheduled for a robotic hysterectomy and on 09/03/15 was taken to the OR. Intraoperative findings were significant for a very abnormal posterior lip of cervix which was nodular and friable. The cervix could be visualized intra-op under general anesthetic. 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 The procedure was aborted and a referral was made to [place].   PET scan on 09/17/15 showed hypermetabolism of a 5.2cm cervical mass and bilateral external iliac nodes with increased FDG uptake suggestive of metastatic disease. Between 10/04/15 to 11/19/15 she received external beam radiation (45 gy in 25 fractions) with radiosensitizing CDDP. Between 11/09/15- 12/07/15 she received intracavitary brachytherapy (HDR) with 27.5Gy given in 5 fractions. She additionally  received a pelvic boost of 9 Gy. She tolerated treatment well with no major GI or GU toxicity.  She did develop a Rt UE DVT for which her port was removed and she was started on lovenox and coumadin.   Post treatment re-staging PET scan was performed on 01/27/16 which showed complete clinical response in the previously enlarged and avid pelvic nodes. No residual cervical mass and residual cervical FDG avidity of 4 demonstrating complete clinical response. No new lesions.    In February 2018 she complained of back pain. PET/CT showed no evidence of malgnancy/recurrence. MRI spine showed some bulging lumbar discs. She had PET avidity in the thyroid gland but negative [place].   In February, 2019 she developed back pain again. Repeat PET/Ct showed no evidence of recurrence.    CT abd/pelvis was ordered and performed on 07/24/18 due to symptoms of spotting and back pain. This revealed No definite findings to strongly indicate metastatic disease in the abdomen or pelvis. There was a ill-defined hypovascular area in the left side of the uterine fundus. This is nonspecific, but favored to represent a small fibroid.   She continues to have diarrhea refractory to stool bulking agents and anti-diarrhea agents. Colonoscopy normal (radiation changes).  She reports back pain and vaginal spotting after sex.  TVUS on 02/11/19 showed a uterus measuring 7 x 3.4 x 4.3 cm with a 2 cm fibroid.  The endometrium was difficult to visualize due to shadowing from the fibroid.  The left and right ovaries were not visualized.  There was a small amount of fluid within the cervical awes and vaginal canal compatible with her history of vaginal  bleeding.   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 She was placed on life-long eloquis.    Interval History: Patient endorses having significant mood symptoms for the last couple of months.  She describes this as starting to cry  for no reason despite not having any major events or difficulties in her life at present.  She continues to have hot flashes and takes venlafaxine for this.  She also continues to use nystatin for groin and pannus candidiasis.  Continues to note some shortness of breath which she attributes to having COVID.  Was evaluated in the emergency department in mid September with chest pain for 2 weeks.  Had CTA that was negative for pulmonary embolism or acute pulmonary findings.  Patient is heterozygous for factor V Leiden mutation.  Past Medical/Surgical History: Past Medical History:  Diagnosis Date   Anemia    Anticoagulated on Coumadin    started 11-26-2015 for right upper arm deep and superficial vein thrombus   Cervical cancer [facility]) oncologist-  dr livesay/  dr kinard/  dr Denman George   dx 08/2015-- Stage IIB poorly differentiated SCC--  chemotherapy weekly (started 10-04-2015)  and external radiation (started 10-05-2015) and planned high-dose radiation via tandom ring direct to cervix to start 11-09-2015   Chemotherapy induced diarrhea    Chemotherapy-induced nausea    Deep vein thrombosis, upper right extremity ([place]) 11-25-2015 dx   deep and superficial involving axillary vein, brachial vein, and basilic vein   Depression    DVT (deep venous thrombosis) (HCC)    Dysuria    GERD (gastroesophageal reflux disease)    caused by chemotherapy   Headache    History of thrombophlebitis RESOLVED-- but per pt prefer no bp or  puncture done in right arm   per pt symptoms 10-20-2015-- on 10-22-2015 dx Superficial thrombus RUE of right basilic vein,where IV access was difficult for first peripheral chemo (not deep venous system) / symptoms resolved with local measures   Hypothyroidism    Port-A-Cath in place    Radiation    currently having radiation therapy started 10-04-2015   Radiation 10/04/15-11/19/15   pelvis 45 Gy, boost to 9 Gy   Radiation 11/09/15-12/07/15   bracytherapy cervical - 27.5 Gy in 5  fractions   Superficial thrombophlebitis of arm 11/25/2015   right upper arm-- secondary to IV access difficult for first chemo    Past Surgical History:  Procedure Laterality Date   CARPAL TUNNEL RELEASE Bilateral 2002 and 2009   CESAREAN SECTION  1991    DILATION AND CURETTAGE OF UTERUS N/A 09/03/2015   Procedure: EXAM UNDER ANESTHESIA  WITH FROZEN SECTION CERVICAL BIOPSY & ENDOCERVICAL CURRETTINGS;  Surgeon: Aloha Gell, MD;  Location: [facility];  Service: Gynecology;  Laterality: N/A;   ECHO STRESS TEST  12-10-2014   normal w/ no evidence for stress-induced ischemia/  normal LV function and wall motion , ef 65-70%   IR GENERIC HISTORICAL  10/25/2015   IR RADIOLOGIST EVAL & MGMT 10/25/2015 Corrie Mckusick, DO [facility]   IR GENERIC HISTORICAL  12/20/2015   IR REMOVAL TUN ACCESS W/ PORT W/O FL MOD SED 12/20/2015 Sandi Mariscal, MD [facility]   LASIK Left 2015   PORTACATH PLACEMENT  10-11-2015   TANDEM RING INSERTION N/A 11/09/2015   Procedure: TANDEM RING INSERTION;  Surgeon: Gery Pray, MD;  Location: [facility];  Service: Urology;  Laterality: N/A;   TANDEM RING INSERTION  N/A 11/16/2015   Procedure: TANDEM RING INSERTION;  Surgeon: Gery Pray, MD;  Location: [facility];  Service: Urology;  Laterality: N/A;   TANDEM RING INSERTION N/A 11/23/2015   Procedure: TANDEM RING INSERTION;  Surgeon: Gery Pray, MD;  Location: [facility];  Service: Urology;  Laterality: N/A;   TANDEM RING INSERTION N/A 11/30/2015   Procedure: TANDEM RING INSERTION;  Surgeon: Gery Pray, MD;  Location: [facility];  Service: Urology;  Laterality: N/A;   TANDEM RING INSERTION N/A 12/07/2015   Procedure: TANDEM RING INSERTION;  Surgeon: Gery Pray, MD;  Location: [facility];  Service: Urology;  Laterality: N/A;   TUBAL LIGATION  1996    Family History  Problem Relation Age of Onset   CAD Mother        Premature disease   Lung cancer  Mother    Lung cancer Father    Brain cancer Father    CAD Brother        MI in his 78s    Social History   Socioeconomic History   Marital status: Married    Spouse name: Not on file   Number of children: 1   Years of education: Not on file   Highest education level: Not on file  Occupational History   Not on file  Tobacco Use   Smoking status: Never   Smokeless tobacco: Never  Vaping Use   Vaping Use: Never used  Substance and Sexual Activity   Alcohol use: No    Alcohol/week: 0.0 standard drinks   Drug use: No   Sexual activity: Yes    Birth control/protection: Surgical  Other Topics Concern   Not on file  Social History Narrative   Not on file   Social Determinants of Health   Financial Resource Strain: Not on file  Food Insecurity: Not on file  Transportation Needs: Not on file  Physical Activity: Not on file  Stress: Not on file  Social Connections: Not on file    Current Medications:  Current Outpatient Medications:    atorvastatin (LIPITOR) 40 MG tablet, Take 40 mg by mouth at bedtime., Disp: , Rfl:    ELIQUIS 5 MG TABS tablet, Take 5 mg by mouth 2 (two) times daily. , Disp: , Rfl:    fluconazole (DIFLUCAN) 150 MG tablet, Take 1 tablet (150 mg total) by mouth daily., Disp: 1 tablet, Rfl: 0   HYDROcodone-acetaminophen (NORCO) 7.5-325 MG tablet, Take 1 tablet by mouth 3 (three) times daily as needed., Disp: , Rfl:    levothyroxine (SYNTHROID) 88 MCG tablet, Take 88 mcg by mouth every morning., Disp: , Rfl:    nystatin (MYCOSTATIN/NYSTOP) powder, Apply 1 application topically 2 (two) times daily., Disp: 15 g, Rfl: 0   trimethoprim (TRIMPEX) 100 MG tablet, Take 100 mg by mouth daily. , Disp: , Rfl: 10   venlafaxine XR (EFFEXOR-XR) 75 MG 24 hr capsule, Take 75 mg by mouth daily., Disp: , Rfl:    zolpidem (AMBIEN) 10 MG tablet, 1 tablet at bedtime as needed, Disp: , Rfl:   Review of Systems: Pertinent positives include fatigue, leg swelling, shortness of  breath, urinary frequency, muscle pain/cramping, headaches, joint pain, numbness, swollen glands, anxiety, depression. Denies appetite changes, fevers, chills, unexplained weight changes. Denies hearing loss, neck lumps or masses, mouth sores, ringing in ears or voice changes. Denies cough or wheezing.   Denies chest pain or palpitations.  Denies abdominal distention, pain, blood in stools, constipation, diarrhea,  nausea, vomiting, or early satiety. Denies pain with intercourse, dysuria, hematuria or incontinence. Denies hot flashes, pelvic pain, vaginal bleeding or vaginal discharge.   Denies joint pain, back pain. Denies itching, rash, or wounds. Denies dizziness or seizures. Denies easy bruising or bleeding. Denies confusion, or decreased concentration.  Physical Exam: BP (!) 170/87 (BP Location: Left Arm, Patient Position: Sitting)    Pulse 79    Temp (!) 97.2 F (36.2 C) (Tympanic)    Resp 18    Ht 5\' 2"  (1.575 m)    Wt 224 lb 8 oz (101.8 kg)    SpO2 100%    BMI 41.06 kg/m  General: Alert, oriented, no acute distress. HEENT: Normocephalic, atraumatic, sclera anicteric. Chest: Clear to auscultation bilaterally.  No wheezes or rhonchi. Cardiovascular: Regular rate and rhythm, no murmurs. Abdomen: Obese, soft, nontender.  Normoactive bowel sounds.  No masses or hepatosplenomegaly appreciated.   Extremities: Grossly normal range of motion.  Warm, well perfused.  No edema bilaterally. Skin: No rashes or lesions noted. Lymphatics: No cervical, supraclavicular, or inguinal adenopathy. GU: Normal appearing external genitalia without erythema, excoriation, or lesions.  Vagina mildly atrophic with radiation changes.  No lesions noted.  Vaginal Pap and HPV collected.  On bimanual exam, vaginal mucosa smooth, no masses or nodularity.  This is confirmed on rectovaginal exam.  Laboratory & Radiologic Studies: None new  Assessment & Plan: Lindsey Hughes is a 50 y.o. 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  Complete clinical response including on post-treatment restaging PET in October, 2017. NO evidence of recurrence since.  Continue using nystatin for Candida in groin.  NED on exam today. Pap and HPV testing performed.  If Pap comes back negative, will release to GYN for yearly surveillance.  Given significant mood symptoms, discussed with her restarting hormone replacement.  I do not think it would be useful to test her hormone levels as she is menopausal by symptoms.  Given her history of DVTs (continues to have persistent right lower extremity blood clot) and factor V Leiden, patient reports PCP had previously stopped her hormone replacement.  On further review, it looks like it was the vascular surgeon who had recommended discussion of stopping estrogen replacement therapy.  Patient is now on therapeutic lifelong anticoagulation.  I do think that even a small dose hormone replacement could help improve her symptoms and is likely only associated with a small increased risk of blood clot now that she is on therapeutic anticoagulation.  I have encouraged her to talk to her primary care provider about this and let me know what she would like to do.   Will call her with pap results. Have asked her to call once she's spoken with PCP regarding reinitiating HRT. She continues on venlafaxine; other option would be to add second anti-depressant.  36 minutes of total time was spent for this patient encounter, including preparation, face-to-face counseling with the patient and coordination of care, and documentation of the encounter.  Jeral Pinch, MD  Division of Gynecologic Oncology  Department of Obstetrics and Gynecology  [facility]

## 2021-04-20 ENCOUNTER — Inpatient Hospital Stay: Payer: BLUE CROSS/BLUE SHIELD | Attending: Gynecologic Oncology | Admitting: Gynecologic Oncology

## 2021-04-20 ENCOUNTER — Encounter: Payer: Self-pay | Admitting: Gynecologic Oncology

## 2021-04-20 ENCOUNTER — Inpatient Hospital Stay (HOSPITAL_COMMUNITY): Admit: 2021-04-20 | Payer: BLUE CROSS/BLUE SHIELD

## 2021-04-20 ENCOUNTER — Other Ambulatory Visit: Payer: Self-pay

## 2021-04-20 VITALS — BP 170/87 | HR 79 | Temp 97.2°F | Resp 18 | Ht 62.0 in | Wt 224.5 lb

## 2021-04-20 DIAGNOSIS — Z8616 Personal history of COVID-19: Secondary | ICD-10-CM | POA: Insufficient documentation

## 2021-04-20 DIAGNOSIS — Z9221 Personal history of antineoplastic chemotherapy: Secondary | ICD-10-CM | POA: Insufficient documentation

## 2021-04-20 DIAGNOSIS — Z86718 Personal history of other venous thrombosis and embolism: Secondary | ICD-10-CM | POA: Diagnosis not present

## 2021-04-20 DIAGNOSIS — D6851 Activated protein C resistance: Secondary | ICD-10-CM | POA: Insufficient documentation

## 2021-04-20 DIAGNOSIS — Z923 Personal history of irradiation: Secondary | ICD-10-CM | POA: Insufficient documentation

## 2021-04-20 DIAGNOSIS — F3289 Other specified depressive episodes: Secondary | ICD-10-CM

## 2021-04-20 DIAGNOSIS — Z79899 Other long term (current) drug therapy: Secondary | ICD-10-CM | POA: Insufficient documentation

## 2021-04-20 DIAGNOSIS — E039 Hypothyroidism, unspecified: Secondary | ICD-10-CM | POA: Diagnosis not present

## 2021-04-20 DIAGNOSIS — Z7901 Long term (current) use of anticoagulants: Secondary | ICD-10-CM | POA: Diagnosis not present

## 2021-04-20 DIAGNOSIS — E28319 Asymptomatic premature menopause: Secondary | ICD-10-CM

## 2021-04-20 DIAGNOSIS — B372 Candidiasis of skin and nail: Secondary | ICD-10-CM | POA: Insufficient documentation

## 2021-04-20 DIAGNOSIS — R0602 Shortness of breath: Secondary | ICD-10-CM | POA: Diagnosis not present

## 2021-04-20 DIAGNOSIS — R232 Flushing: Secondary | ICD-10-CM | POA: Insufficient documentation

## 2021-04-20 DIAGNOSIS — Z8541 Personal history of malignant neoplasm of cervix uteri: Secondary | ICD-10-CM | POA: Insufficient documentation

## 2021-04-20 DIAGNOSIS — B3789 Other sites of candidiasis: Secondary | ICD-10-CM

## 2021-04-20 DIAGNOSIS — K219 Gastro-esophageal reflux disease without esophagitis: Secondary | ICD-10-CM | POA: Diagnosis not present

## 2021-04-20 DIAGNOSIS — C539 Malignant neoplasm of cervix uteri, unspecified: Secondary | ICD-10-CM

## 2021-04-20 MED ORDER — NYSTATIN 100000 UNIT/GM EX POWD: 1.0000 "application " | Freq: Two times a day (BID) | CUTANEOUS | 0 refills | Status: DC

## 2021-04-20 NOTE — Patient Instructions (Signed)
It was a pleasure to meet you today.  I will call you with your Pap results once back, likely next week.  As long as this comes back negative, given that you are more than 5 years out from finishing your treatment, we will release you back to your gynecologist.  With regard to your mood symptoms, I do not think that hormone testing is going to be helpful.  You have been menopausal now for multiple years.  I would like you to have a conversation with your primary care provider before we restart hormone replacement, given that this was discontinued by him.  Please call me after you have talked to him.

## 2021-04-21 ENCOUNTER — Encounter: Payer: Self-pay | Admitting: Oncology

## 2021-04-29 DIAGNOSIS — N958 Other specified menopausal and perimenopausal disorders: Secondary | ICD-10-CM | POA: Diagnosis not present

## 2021-04-29 DIAGNOSIS — B370 Candidal stomatitis: Secondary | ICD-10-CM | POA: Diagnosis not present

## 2021-04-30 LAB — CYTOLOGY - PAP
Comment: NEGATIVE
Diagnosis: UNDETERMINED — AB
High risk HPV: NEGATIVE

## 2021-05-02 ENCOUNTER — Telehealth: Payer: Self-pay

## 2021-05-02 NOTE — Telephone Encounter (Signed)
Attempted to contact patient to review pap smear results. Unable to reach patient. Left message requesting return call.

## 2021-05-03 NOTE — Telephone Encounter (Signed)
Attempted to contact patient to review pap smear results. Unable to reach patient and unable to leave message as voicemail is full.

## 2021-05-05 ENCOUNTER — Encounter: Payer: Self-pay | Admitting: Oncology

## 2021-05-05 NOTE — Telephone Encounter (Signed)
Spoke with Lindsey Hughes this afternoon regarding her pap smear results. Reviewed ASC-US results, HPV negative. Per Joylene John, NP ASC-US related to radiation changes. Pt to follow up with regular GYN in one year. Patient verbalized understanding.

## 2021-05-26 DIAGNOSIS — B370 Candidal stomatitis: Secondary | ICD-10-CM | POA: Diagnosis not present

## 2021-05-26 DIAGNOSIS — J3489 Other specified disorders of nose and nasal sinuses: Secondary | ICD-10-CM | POA: Diagnosis not present

## 2021-05-26 DIAGNOSIS — B349 Viral infection, unspecified: Secondary | ICD-10-CM | POA: Diagnosis not present

## 2021-08-02 DIAGNOSIS — B370 Candidal stomatitis: Secondary | ICD-10-CM | POA: Diagnosis not present

## 2021-08-02 DIAGNOSIS — I10 Essential (primary) hypertension: Secondary | ICD-10-CM | POA: Diagnosis not present

## 2021-08-02 DIAGNOSIS — D649 Anemia, unspecified: Secondary | ICD-10-CM | POA: Diagnosis not present

## 2021-08-02 DIAGNOSIS — R5383 Other fatigue: Secondary | ICD-10-CM | POA: Diagnosis not present

## 2021-08-02 DIAGNOSIS — G4710 Hypersomnia, unspecified: Secondary | ICD-10-CM | POA: Diagnosis not present

## 2021-08-02 DIAGNOSIS — E782 Mixed hyperlipidemia: Secondary | ICD-10-CM | POA: Diagnosis not present

## 2021-08-02 DIAGNOSIS — E039 Hypothyroidism, unspecified: Secondary | ICD-10-CM | POA: Diagnosis not present

## 2021-08-02 DIAGNOSIS — E538 Deficiency of other specified B group vitamins: Secondary | ICD-10-CM | POA: Diagnosis not present

## 2022-01-03 ENCOUNTER — Other Ambulatory Visit: Payer: Self-pay | Admitting: Internal Medicine

## 2022-01-03 DIAGNOSIS — N644 Mastodynia: Secondary | ICD-10-CM

## 2022-02-07 ENCOUNTER — Encounter: Payer: Self-pay | Admitting: Oncology

## 2022-02-14 ENCOUNTER — Ambulatory Visit
Admission: RE | Admit: 2022-02-14 | Discharge: 2022-02-14 | Disposition: A | Discharge status: Home / Self Care | Payer: 59 | Source: Ambulatory Visit | Attending: Internal Medicine | Admitting: Internal Medicine

## 2022-02-14 ENCOUNTER — Encounter: Payer: Self-pay | Admitting: Oncology

## 2022-02-14 ENCOUNTER — Other Ambulatory Visit: Payer: Self-pay | Admitting: Internal Medicine

## 2022-02-14 ENCOUNTER — Ambulatory Visit
Admission: RE | Admit: 2022-02-14 | Discharge: 2022-02-14 | Disposition: A | Discharge status: Home / Self Care | Payer: BLUE CROSS/BLUE SHIELD | Source: Ambulatory Visit | Attending: Internal Medicine | Admitting: Internal Medicine

## 2022-02-14 DIAGNOSIS — N644 Mastodynia: Secondary | ICD-10-CM

## 2022-02-14 DIAGNOSIS — N6310 Unspecified lump in the right breast, unspecified quadrant: Secondary | ICD-10-CM

## 2022-02-14 DIAGNOSIS — N6489 Other specified disorders of breast: Secondary | ICD-10-CM

## 2022-04-15 ENCOUNTER — Emergency Department (HOSPITAL_COMMUNITY): Payer: 59

## 2022-04-15 ENCOUNTER — Encounter (HOSPITAL_COMMUNITY): Payer: Self-pay | Admitting: Emergency Medicine

## 2022-04-15 ENCOUNTER — Emergency Department (HOSPITAL_COMMUNITY)
Admission: EM | Admit: 2022-04-15 | Discharge: 2022-04-16 | Discharge status: Against Medical Advice | Payer: 59 | Attending: Emergency Medicine | Admitting: Emergency Medicine

## 2022-04-15 DIAGNOSIS — R0981 Nasal congestion: Secondary | ICD-10-CM | POA: Insufficient documentation

## 2022-04-15 DIAGNOSIS — R0602 Shortness of breath: Secondary | ICD-10-CM | POA: Insufficient documentation

## 2022-04-15 DIAGNOSIS — Z5321 Procedure and treatment not carried out due to patient leaving prior to being seen by health care provider: Secondary | ICD-10-CM | POA: Insufficient documentation

## 2022-04-15 DIAGNOSIS — R059 Cough, unspecified: Secondary | ICD-10-CM | POA: Insufficient documentation

## 2022-04-15 LAB — MAGNESIUM: Magnesium: 2.3 mg/dL (ref 1.7–2.4)

## 2022-04-15 LAB — BASIC METABOLIC PANEL
Anion gap: 10 (ref 5–15)
BUN: 13 mg/dL (ref 6–20)
CO2: 24 mmol/L (ref 22–32)
Calcium: 9 mg/dL (ref 8.9–10.3)
Chloride: 102 mmol/L (ref 98–111)
Creatinine, Ser: 1.12 mg/dL — ABNORMAL HIGH (ref 0.44–1.00)
GFR, Estimated: 59 mL/min — ABNORMAL LOW (ref >60)
Glucose, Bld: 105 mg/dL — ABNORMAL HIGH (ref 70–99)
Potassium: 3.6 mmol/L (ref 3.5–5.1)
Sodium: 136 mmol/L (ref 135–145)

## 2022-04-15 LAB — CBC WITH DIFFERENTIAL/PLATELET
Abs Immature Granulocytes: 0.03 10*3/uL (ref 0.00–0.07)
Basophils Absolute: 0.1 10*3/uL (ref 0.0–0.1)
Basophils Relative: 1 %
Eosinophils Absolute: 0 10*3/uL (ref 0.0–0.5)
Eosinophils Relative: 1 %
HCT: 27.8 % — ABNORMAL LOW (ref 36.0–46.0)
Hemoglobin: 7.1 g/dL — ABNORMAL LOW (ref 12.0–15.0)
Immature Granulocytes: 0 %
Lymphocytes Relative: 19 %
Lymphs Abs: 1.5 10*3/uL (ref 0.7–4.0)
MCH: 17.4 pg — ABNORMAL LOW (ref 26.0–34.0)
MCHC: 25.5 g/dL — ABNORMAL LOW (ref 30.0–36.0)
MCV: 68.3 fL — ABNORMAL LOW (ref 80.0–100.0)
Monocytes Absolute: 0.6 10*3/uL (ref 0.1–1.0)
Monocytes Relative: 8 %
Neutro Abs: 5.6 10*3/uL (ref 1.7–7.7)
Neutrophils Relative %: 71 %
Platelets: 419 10*3/uL — ABNORMAL HIGH (ref 150–400)
RBC: 4.07 MIL/uL (ref 3.87–5.11)
RDW: 18.7 % — ABNORMAL HIGH (ref 11.5–15.5)
Smear Review: NORMAL
WBC: 7.9 10*3/uL (ref 4.0–10.5)
nRBC: 0.3 % — ABNORMAL HIGH (ref 0.0–0.2)

## 2022-04-15 NOTE — ED Triage Notes (Signed)
SOF and congestion x 6 mos. No respiratory distress. Takes blood thinners for DVT of right leg. PT notes unchanged.

## 2022-04-15 NOTE — ED Provider Triage Note (Signed)
Emergency Medicine Provider Triage Evaluation Note  Lindsey Hughes , a 51 y.o. female  was evaluated in triage.  Pt complains of sob, cough, and congestion x 6 months. SOB worse with minimal exertion. Reports this happened earlier this year and she was told it was due to "thrush" but symptoms never went away with treatment. No nausea, vomiting, diarrhea, fever, chills, abdominal pain. Known RLE DVT, compliant with blood thinners.   Review of Systems  Positive: See HPI Negative: See HPI  Physical Exam  BP (!) 162/91 (BP Location: Right Arm)   Pulse 92   Temp 98 F (36.7 C) (Oral)   Resp (!) 22   SpO2 98%  Gen:   Awake, no distress   Resp:  Normal effort LCTA MSK:   Moves extremities without difficulty  Other:  RRR, no respiratory distress  Medical Decision Making  Medically screening exam initiated at 9:41 PM.  Appropriate orders placed.  Lindsey Hughes was informed that the remainder of the evaluation will be completed by another provider, this initial triage assessment does not replace that evaluation, and the importance of remaining in the ED until their evaluation is complete.     Lindsey Hughes 04/15/22 [id number]

## 2022-04-16 NOTE — ED Notes (Signed)
X2 no response for vitals recheck  

## 2022-06-20 LAB — HM COLONOSCOPY

## 2022-07-18 ENCOUNTER — Encounter: Payer: Self-pay | Admitting: *Deleted

## 2022-07-19 ENCOUNTER — Ambulatory Visit: Payer: 59 | Attending: Internal Medicine | Admitting: Internal Medicine

## 2022-07-19 NOTE — Progress Notes (Signed)
Erroneous encounter - please disregard.

## 2022-07-26 ENCOUNTER — Encounter: Payer: Self-pay | Admitting: Internal Medicine

## 2022-08-04 ENCOUNTER — Other Ambulatory Visit: Payer: Self-pay | Admitting: Internal Medicine

## 2022-08-04 DIAGNOSIS — N6489 Other specified disorders of breast: Secondary | ICD-10-CM

## 2022-08-17 ENCOUNTER — Other Ambulatory Visit: Payer: 59

## 2022-09-12 NOTE — Telephone Encounter (Signed)
This encounter was created in error - please disregard.

## 2023-02-21 ENCOUNTER — Other Ambulatory Visit: Payer: Self-pay | Admitting: Internal Medicine

## 2023-02-21 DIAGNOSIS — N6489 Other specified disorders of breast: Secondary | ICD-10-CM

## 2023-03-02 ENCOUNTER — Other Ambulatory Visit: Payer: 59

## 2023-03-20 ENCOUNTER — Other Ambulatory Visit: Payer: 59

## 2023-04-12 ENCOUNTER — Other Ambulatory Visit: Payer: 59

## 2023-05-17 ENCOUNTER — Inpatient Hospital Stay: Admission: RE | Admit: 2023-05-17 | Payer: 59 | Source: Ambulatory Visit

## 2023-06-12 ENCOUNTER — Ambulatory Visit: Payer: 59

## 2023-06-12 ENCOUNTER — Ambulatory Visit
Admission: RE | Admit: 2023-06-12 | Discharge: 2023-06-12 | Disposition: A | Discharge status: Home / Self Care | Payer: 59 | Source: Ambulatory Visit | Attending: Internal Medicine | Admitting: Internal Medicine

## 2023-06-12 DIAGNOSIS — N6489 Other specified disorders of breast: Secondary | ICD-10-CM

## 2024-01-14 NOTE — Progress Notes (Unsigned)
 Lindsey Console, PA-C [address] [place], [place]  [address] Phone: [phone]   Gastroenterology Consultation  Referring Provider:     Renato Dorothey HERO, NP Primary Care Physician:  Renato Dorothey HERO, NP Primary Gastroenterologist:  Lindsey Console, PA-C / Elspeth Naval, MD  Reason for Consultation:     Iron deficiency anemia        HPI:   Lindsey Hughes is a 53 y.o. y/o female referred for consultation & management  by Renato Dorothey HERO, NP.    Patient has history of factor V Leiden which is followed by hematologist Dr. Roxan at [facility].  Currently on Eliquis  daily.  She has DVT in her right leg.  She was told she will need to stay on chronic anticoagulation indefinitely.  In the past 2 years patient has had difficulty with iron deficient anemia due to chronic GI blood loss.  Her hemoglobin dropped in 2024.  She was hospitalized at [facility] in [place] 05/2022 for severe anemia.  Received blood transfusion.  She Underwent EGD and colonoscopy 05/2022 in the hospital performed by [facility] and results are not available.  Patient states [facility] does not take her insurance, therefore she is transferring GI care to our office.  Current symptoms: Patient states she continues to have recurrent iron deficiency anemia.  Currently on iron 325 mg once daily.  Stools are dark on iron.  She has never seen any bright red blood or black tarry stools.  She has generalized abdominal pain throughout her entire abdomen.  Has chronic constipation with hard stools and straining.  No current treatment for constipation.  She admits to fatigue.  Denies heartburn or dysphagia.  12/11/2023 labs: Hemoglobin 10.1, hematocrit 32.8, MCV 77.4, platelet 374.  Microcytic anemia.  Potassium 3.6.  BUN 13, creatinine 1.10, GFR 60.  Normal LFTs.  Normal lipase.  07/2023: Hemoglobin 9.9, hematocrit 32.9, MCV 77.  PMH: Factor V Leiden, right leg DVT, right arm DVT in 2017, GERD, Hx  Cervical Cancer (Dx 2017), GERD, Hypothyroidism, currently on Eliquis .  Past Medical History:  Diagnosis Date   Anemia    Anticoagulated on Coumadin     started 11-26-2015 for right upper arm deep and superficial vein thrombus   Anxiety    Cervical cancer [facility]) oncologist-  dr livesay/  dr kinard/  dr eloy   dx 08/2015-- Stage IIB poorly differentiated SCC--  chemotherapy weekly (started 10-04-2015)  and external radiation (started 10-05-2015) and planned high-dose radiation via tandom ring direct to cervix to start 11-09-2015   Chemotherapy induced diarrhea    Chemotherapy-induced nausea    Deep vein thrombosis, upper right extremity (HCC) 11-25-2015 dx   deep and superficial involving axillary vein, brachial vein, and basilic vein   Depression    DVT (deep venous thrombosis) (HCC)    Dysuria    Factor V Leiden    GERD (gastroesophageal reflux disease)    caused by chemotherapy   Headache    History of thrombophlebitis RESOLVED-- but per pt prefer no bp or  puncture done in right arm   per pt symptoms 10-20-2015-- on 10-22-2015 dx Superficial thrombus RUE of right basilic vein,where IV access was difficult for first peripheral chemo (not deep venous system) / symptoms resolved with local measures   HLD (hyperlipidemia)    HTN (hypertension)    Hypothyroidism    Port-A-Cath in place    Radiation    currently having radiation therapy started 10-04-2015   Radiation  10/04/15-11/19/15   pelvis 45 Gy, boost to 9 Gy   Radiation 11/09/15-12/07/15   bracytherapy cervical - 27.5 Gy in 5 fractions   Superficial thrombophlebitis of arm 11/25/2015   right upper arm-- secondary to IV access difficult for first chemo    Past Surgical History:  Procedure Laterality Date   CARPAL TUNNEL RELEASE Bilateral 2002 and 2009   CESAREAN SECTION  1991    DILATION AND CURETTAGE OF UTERUS N/A 09/03/2015   Procedure: EXAM UNDER ANESTHESIA  WITH FROZEN SECTION CERVICAL BIOPSY & ENDOCERVICAL CURRETTINGS;   Surgeon: Burnard Bowers, MD;  Location: [facility];  Service: Gynecology;  Laterality: N/A;   ECHO STRESS TEST  12-10-2014   normal w/ no evidence for stress-induced ischemia/  normal LV function and wall motion , ef 65-70%   IR GENERIC HISTORICAL  10/25/2015   IR RADIOLOGIST EVAL & MGMT 10/25/2015 Ami Bellman, DO [facility]   IR GENERIC HISTORICAL  12/20/2015   IR REMOVAL TUN ACCESS W/ PORT W/O FL MOD SED 12/20/2015 Norleen Roulette, MD [facility]   LASIK Left 2015   PORTACATH PLACEMENT  10-11-2015   TANDEM RING INSERTION N/A 11/09/2015   Procedure: TANDEM RING INSERTION;  Surgeon: Lynwood Nasuti, MD;  Location: [facility];  Service: Urology;  Laterality: N/A;   TANDEM RING INSERTION N/A 11/16/2015   Procedure: TANDEM RING INSERTION;  Surgeon: Lynwood Nasuti, MD;  Location: [facility];  Service: Urology;  Laterality: N/A;   TANDEM RING INSERTION N/A 11/23/2015   Procedure: TANDEM RING INSERTION;  Surgeon: Lynwood Nasuti, MD;  Location: [facility];  Service: Urology;  Laterality: N/A;   TANDEM RING INSERTION N/A 11/30/2015   Procedure: TANDEM RING INSERTION;  Surgeon: Lynwood Nasuti, MD;  Location: [facility];  Service: Urology;  Laterality: N/A;   TANDEM RING INSERTION N/A 12/07/2015   Procedure: TANDEM RING INSERTION;  Surgeon: Lynwood Nasuti, MD;  Location: [facility];  Service: Urology;  Laterality: N/A;   TUBAL LIGATION  1996    Prior to Admission medications   Medication Sig Start Date End Date Taking? Authorizing Provider  atorvastatin (LIPITOR) 40 MG tablet Take 40 mg by mouth at bedtime. 12/20/18   [provider]  ELIQUIS  5 MG TABS tablet Take 5 mg by mouth 2 (two) times daily.  05/12/19   [provider]  fluconazole  (DIFLUCAN ) 150 MG tablet Take 1 tablet (150 mg total) by mouth daily. 04/15/20   Cross, Melissa D, NP  HYDROcodone -acetaminophen  (NORCO) 7.5-325 MG tablet Take 1 tablet by mouth 3 (three) times  daily as needed. 12/19/19   [provider]  levothyroxine (SYNTHROID) 88 MCG tablet Take 88 mcg by mouth every morning. 12/08/19   [provider]  nystatin  (MYCOSTATIN /NYSTOP ) powder Apply 1 application topically 2 (two) times daily. 04/20/21   Viktoria Comer SAUNDERS, MD  trimethoprim (TRIMPEX) 100 MG tablet Take 100 mg by mouth daily.  05/26/17   [provider]  venlafaxine  XR (EFFEXOR -XR) 75 MG 24 hr capsule Take 75 mg by mouth daily. 12/08/19   [provider]  zolpidem (AMBIEN) 10 MG tablet 1 tablet at bedtime as needed 01/03/21   [provider]    Family History  Problem Relation Age of Onset   CAD Mother        Premature disease   Lung cancer Mother    Clotting disorder Mother    Heart disease Mother    Lung cancer Father    Brain  cancer Father    Hypertension Father    Gallstones Father    Clotting disorder Father    COPD Half-Sister    Alcoholism Paternal Grandmother    Alcoholism Paternal Grandfather    Heart attack Half-Brother    Anxiety disorder Daughter    Depression Daughter    Hypertension Daughter      Social History   Tobacco Use   Smoking status: Never   Smokeless tobacco: Never  Vaping Use   Vaping status: Never Used  Substance Use Topics   Alcohol use: No    Alcohol/week: 0.0 standard drinks of alcohol   Drug use: No    Allergies as of 01/15/2024 - Review Complete 01/15/2024  Allergen Reaction Noted   Zofran  [ondansetron  hcl] Other (See Comments) 10/31/2015    Review of Systems:    All systems reviewed and negative except where noted in HPI.   Physical Exam:  BP 124/70 (BP Location: Left Arm, Patient Position: Sitting, Cuff Size: Large)   Pulse 84   Ht 5' 2.25 (1.581 m) Comment: height measured without shoes  Wt 231 lb 4 oz (104.9 kg)   BMI 41.96 kg/m  No LMP recorded. (Menstrual status: Chemotherapy).  General:   Alert,  Well-developed, well-nourished, pleasant and cooperative in NAD Lungs:   Respirations even and unlabored.  Clear throughout to auscultation.   No wheezes, crackles, or rhonchi. No acute distress. Heart:  Regular rate and rhythm; no murmurs, clicks, rubs, or gallops. Abdomen:  Normal bowel sounds.  No bruits.  Soft, and non-distended without masses, hepatosplenomegaly or hernias noted.  No Tenderness.  No guarding or rebound tenderness.    Neurologic:  Alert and oriented x3;  grossly normal neurologically. Psych:  Alert and cooperative. Normal mood and affect.  Imaging Studies: No results found.  Labs: CBC    Component Value Date/Time   WBC 5.4 01/15/2024 1142   RBC 4.48 01/15/2024 1142   HGB 10.2 (L) 01/15/2024 1142   HGB 12.6 02/28/2016 0916   HCT 32.6 (L) 01/15/2024 1142   HCT 37.3 02/28/2016 0916   PLT 425.0 (H) 01/15/2024 1142   PLT 268 02/28/2016 0916   MCV 72.8 (L) 01/15/2024 1142   MCV 95.4 02/28/2016 0916    CMP     Component Value Date/Time   NA 137 01/15/2024 1142   NA 141 02/04/2016 1317   K 4.2 01/15/2024 1142   K 3.7 02/04/2016 1317   CL 100 01/15/2024 1142   CO2 26 01/15/2024 1142   CO2 27 02/04/2016 1317   GLUCOSE 126 (H) 01/15/2024 1142   GLUCOSE 95 02/04/2016 1317   BUN 16 01/15/2024 1142   BUN 18.5 02/04/2016 1317   CREATININE 1.07 01/15/2024 1142   CREATININE 1.00 07/23/2018 0826   CREATININE 1.0 02/04/2016 1317   CALCIUM 9.5 01/15/2024 1142   CALCIUM 9.6 02/04/2016 1317   PROT 7.1 03/16/2018 1652   PROT 7.3 02/04/2016 1317   ALBUMIN 3.1 (L) 03/16/2018 1652   ALBUMIN 3.3 (L) 02/04/2016 1317   AST 20 03/16/2018 1652   AST 13 02/04/2016 1317   ALT 14 03/16/2018 1652   ALT 11 02/04/2016 1317   ALKPHOS 97 03/16/2018 1652   ALKPHOS 80 02/04/2016 1317   BILITOT 0.2 (L) 03/16/2018 1652   BILITOT 0.23 02/04/2016 1317   GFRNONAA 59 (L) 04/15/2022 2157   GFRNONAA >60 07/23/2018 0826   GFRAA >60 07/23/2018 0826    Assessment and Plan:   Laityn Bensen is a 53 y.o. y/o female has  been referred for:  Iron Deficiency  Anemia - Labs: CBC, iron panel, ferritin and celiac lab.  - Requesting previous EGD and colonoscopy records done 05/2022 at [facility] through [facility]. - Once I review EGD and colonoscopy, then we can decide about repeating procedures. - She will also likely need capsule endoscopy, date to be determined. - She is currently on oral iron. - If iron is still low, then increase iron to twice daily dose or consider IV iron infusion.  2.  Anemia unspecified - Labs: vitamin B12 and folate - Not currently on B12 or folate.  3.  Chronic constipation (likely opioid-induced). - Gave samples of Linzess 291 tablet once daily to try.  If the medicine works well, then we will call in prescription or adjust dose as needed. - Continue high-fiber diet and 64 ounces of water  daily.  4.  Hypokalemia - Lab BMP  5.  Comorbidities: Factor V Leiden, history of DVTs, chronic anticoagulation on Eliquis .  Followed by Dr. Roxan hematologist at [facility]. -Requesting permission from Dr. Roxan to hold Eliquis  2 days before EGD and colonoscopy, procedures yet to be scheduled.   - If Eliquis  hold is not approved, then we may need to do Lovenox  bridging for procedures.   Follow up based on above test results.  Lindsey Console, PA-C

## 2024-01-15 ENCOUNTER — Other Ambulatory Visit (INDEPENDENT_AMBULATORY_CARE_PROVIDER_SITE_OTHER)

## 2024-01-15 ENCOUNTER — Ambulatory Visit (INDEPENDENT_AMBULATORY_CARE_PROVIDER_SITE_OTHER): Admitting: Physician Assistant

## 2024-01-15 ENCOUNTER — Encounter: Payer: Self-pay | Admitting: Physician Assistant

## 2024-01-15 VITALS — BP 124/70 | HR 84 | Ht 62.25 in | Wt 231.2 lb

## 2024-01-15 DIAGNOSIS — D509 Iron deficiency anemia, unspecified: Secondary | ICD-10-CM

## 2024-01-15 DIAGNOSIS — E876 Hypokalemia: Secondary | ICD-10-CM

## 2024-01-15 DIAGNOSIS — D649 Anemia, unspecified: Secondary | ICD-10-CM

## 2024-01-15 DIAGNOSIS — D6851 Activated protein C resistance: Secondary | ICD-10-CM | POA: Insufficient documentation

## 2024-01-15 DIAGNOSIS — K5909 Other constipation: Secondary | ICD-10-CM

## 2024-01-15 DIAGNOSIS — I82501 Chronic embolism and thrombosis of unspecified deep veins of right lower extremity: Secondary | ICD-10-CM | POA: Insufficient documentation

## 2024-01-15 DIAGNOSIS — Z7901 Long term (current) use of anticoagulants: Secondary | ICD-10-CM

## 2024-01-15 DIAGNOSIS — K5904 Chronic idiopathic constipation: Secondary | ICD-10-CM

## 2024-01-15 LAB — CBC WITH DIFFERENTIAL/PLATELET
Basophils Absolute: 0.1 K/uL (ref 0.0–0.1)
Basophils Relative: 1 % (ref 0.0–3.0)
Eosinophils Absolute: 0.1 K/uL (ref 0.0–0.7)
Eosinophils Relative: 2.2 % (ref 0.0–5.0)
HCT: 32.6 % — ABNORMAL LOW (ref 36.0–46.0)
Hemoglobin: 10.2 g/dL — ABNORMAL LOW (ref 12.0–15.0)
Lymphocytes Relative: 14.8 % (ref 12.0–46.0)
Lymphs Abs: 0.8 K/uL (ref 0.7–4.0)
MCHC: 31.2 g/dL (ref 30.0–36.0)
MCV: 72.8 fl — ABNORMAL LOW (ref 78.0–100.0)
Monocytes Absolute: 0.4 K/uL (ref 0.1–1.0)
Monocytes Relative: 6.7 % (ref 3.0–12.0)
Neutro Abs: 4.1 K/uL (ref 1.4–7.7)
Neutrophils Relative %: 75.3 % (ref 43.0–77.0)
Platelets: 425 K/uL — ABNORMAL HIGH (ref 150.0–400.0)
RBC: 4.48 Mil/uL (ref 3.87–5.11)
RDW: 16.4 % — ABNORMAL HIGH (ref 11.5–15.5)
WBC: 5.4 K/uL (ref 4.0–10.5)

## 2024-01-15 LAB — BASIC METABOLIC PANEL WITH GFR
BUN: 16 mg/dL (ref 6–23)
CO2: 26 meq/L (ref 19–32)
Calcium: 9.5 mg/dL (ref 8.4–10.5)
Chloride: 100 meq/L (ref 96–112)
Creatinine, Ser: 1.07 mg/dL (ref 0.40–1.20)
GFR: 58.94 mL/min — ABNORMAL LOW (ref >60.00)
Glucose, Bld: 126 mg/dL — ABNORMAL HIGH (ref 70–99)
Potassium: 4.2 meq/L (ref 3.5–5.1)
Sodium: 137 meq/L (ref 135–145)

## 2024-01-15 LAB — VITAMIN B12: Vitamin B-12: 213 pg/mL (ref 211–911)

## 2024-01-15 LAB — FOLATE: Folate: 14.9 ng/mL (ref >5.9)

## 2024-01-15 NOTE — Patient Instructions (Signed)
 Your provider has requested that you go to the basement level for lab work before leaving today. Press B on the elevator. The lab is located at the first door on the left as you exit the elevator.  We have given you samples of the following medication to take: Linzess 290 mcg once daily  Please follow up sooner if symptoms increase or worsen  Due to recent changes in healthcare laws, you may see the results of your imaging and laboratory studies on MyChart before your provider has had a chance to review them.  We understand that in some cases there may be results that are confusing or concerning to you. Not all laboratory results come back in the same time frame and the provider may be waiting for multiple results in order to interpret others.  Please give us  48 hours in order for your provider to thoroughly review all the results before contacting the office for clarification of your results.   Thank you for trusting me with your gastrointestinal care!   Ellouise Console, PA-C _______________________________________________________  If your blood pressure at your visit was 140/90 or greater, please contact your primary care physician to follow up on this.  _______________________________________________________  If you are age 37 or older, your body mass index should be between 23-30. Your Body mass index is 41.96 kg/m. If this is out of the aforementioned range listed, please consider follow up with your Primary Care Provider.  If you are age 69 or younger, your body mass index should be between 19-25. Your Body mass index is 41.96 kg/m. If this is out of the aformentioned range listed, please consider follow up with your Primary Care Provider.   ________________________________________________________  The [facility] providers would like to encourage you to use MYCHART to communicate with providers for non-urgent requests or questions.  Due to long hold times on the telephone, sending your  provider a message by [facility] may be a faster and more efficient way to get a response.  Please allow 48 business hours for a response.  Please remember that this is for non-urgent requests.  _______________________________________________________

## 2024-01-15 NOTE — Progress Notes (Signed)
 Agree with assessment and plan as outlined.  We need EGD and colonoscopy reports prior to making decision about next steps, as she had these exams done in the setting of her anemia. Need to confirm her prep was adequate, etc. She had CT scan abdomen / pelvis in 03/2023 which did not show any concerning process. Once we have records of her EGD and colonoscopy will discuss if these need to be repeated or if we should pursue a capsule study. In the interim I would start IV iron if she remains anemic despite oral iron. Thanks

## 2024-01-16 LAB — IRON,TIBC AND FERRITIN PANEL
%SAT: 4 % — ABNORMAL LOW (ref 16–45)
Ferritin: 7 ng/mL — ABNORMAL LOW (ref 16–232)
Iron: 23 ug/dL — ABNORMAL LOW (ref 45–160)
TIBC: 521 ug/dL — ABNORMAL HIGH (ref 250–450)

## 2024-01-17 LAB — CELIAC DISEASE AB SCREEN W/RFX
Antigliadin Abs, IgA: 4 U (ref 0–19)
IgA/Immunoglobulin A, Serum: 442 mg/dL — ABNORMAL HIGH (ref 87–352)
Transglutaminase IgA: <2 U/mL (ref 0–3)

## 2024-01-22 ENCOUNTER — Other Ambulatory Visit: Payer: Self-pay

## 2024-01-22 ENCOUNTER — Ambulatory Visit: Payer: Self-pay | Admitting: Physician Assistant

## 2024-01-22 DIAGNOSIS — D509 Iron deficiency anemia, unspecified: Secondary | ICD-10-CM

## 2024-01-24 ENCOUNTER — Telehealth: Payer: Self-pay | Admitting: Physician Assistant

## 2024-01-24 NOTE — Telephone Encounter (Signed)
 I received GI records from [facility] endoscopy center, [facility].   05/2022 EGD was normal.  Biopsies negative for celiac and H. pylori.  There was 1 small gastric polyp removed and pathology showed hyperplastic gastric mucosa with focal intestinal metaplasia.   05/2022 colonoscopy showed very poor inadequate prep.  There was patchy moderate mucosal changes in the sigmoid colon secondary to colitis and internal hemorrhoids.  Sigmoid colon biopsies showed benign colon mucosa with chronic mucosal injury, negative for dysplasia.   I recommend we schedule a repeat colonoscopy with 2-day prep.  Diagnosis iron deficiency anemia.  She will need permission to hold Eliquis  2 days before procedure.   Ellouise Console, PA-C

## 2024-02-04 ENCOUNTER — Encounter: Payer: Self-pay | Admitting: Gastroenterology

## 2024-02-05 ENCOUNTER — Other Ambulatory Visit: Payer: Self-pay | Admitting: Medical Genetics

## 2024-02-05 NOTE — Progress Notes (Cosign Needed Addendum)
 [facility]   [phone] [phone]) [phone]   Clinic New consult Note   Patient Care Team: Renato Dorothey HERO, NP as PCP - General (Internal Medicine) 02/24/2024  CHIEF COMPLAINTS/PURPOSE OF CONSULTATION:  Iron deficiency Anemia   HISTORY OF PRESENTING ILLNESS:  Lindsey Hughes 53 y.o. female is here because of iron deficiency anemia.  She does have history of factor V Leiden which was prevoiusly followed by hematologist Dr. Roxan at [facility].  She does take Eliquis  daily.  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  She is on anticoagulation indefinitely.  She was referred by her GI provider, Ellouise Console, PA.  The patient has been having iron deficiency anemia due to chronic GI blood loss.  She has previously been hospitalized due to severe anemia in 05/2022.  She required blood transfusion.  She had EGD and colonoscopy in 05/2022 while hospitalized.  She currently takes oral iron 325 mg once daily.  She does have chronic constipation.  Most recent labs from 01/15/2024 show her Hgb 10.2, HCT 32.6, MCV 72.8, and platelets 425.  Her iron was low at 23, TIBC 521, 4% saturation, and ferritin was 7.  Her B12 and folate were both normal.  She also had celiac panel which was negative.  She was also negative for gluten allergy. She denies recent chest pain on exertion, shortness of breath on minimal exertion, pre-syncopal episodes, or palpitations. She had not noticed any recent bleeding such as epistaxis, hematuria or hematochezia. The patient denies over the counter NSAID ingestion. She is on antiplatelets agents.  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 Her last colonoscopy was 05/2022. She has history of metastatic cervical cancer.  This affected her uterus, lymph nodes, and ovaries.  She states she has been 7 years cancer free.  Her age appropriate screening programs are up-to-date. She has experienced pica, specifically with cravings of ice.  She eats a  variety of diet. She never donated blood.  She has received blood transfusions in the past.  She is unsure if she received iron infusions.  The patient was prescribed oral iron supplements and she takes ferrous sulfate 325 mg daily.  She also takes vitamin B12 daily.    REVIEW OF SYSTEMS:   Constitutional: Denies fevers, chills or abnormal night sweats. Fatigue.   Eyes: Denies blurriness of vision, double vision or watery eyes Ears, nose, mouth, throat, and face: Denies mucositis or sore throat Respiratory: Denies cough, dyspnea or wheezes Cardiovascular: Denies palpitation, chest discomfort or lower extremity swelling Gastrointestinal:  Denies nausea, heartburn or change in bowel habits Skin: Denies abnormal skin rashes Lymphatics: Denies new lymphadenopathy or easy bruising Neurological:Denies numbness, tingling or new weaknesses Behavioral/Psych: Mood is stable, no new changes   All other systems were reviewed with the patient and are negative.   MEDICAL HISTORY:  Past Medical History:  Diagnosis Date   Anemia    Anticoagulated on Coumadin     started 11-26-2015 for right upper arm deep and superficial vein thrombus   Anxiety    Cervical cancer [facility]) oncologist-  dr livesay/  dr kinard/  dr eloy   dx 08/2015-- Stage IIB poorly differentiated SCC--  chemotherapy weekly (started 10-04-2015)  and external radiation (started 10-05-2015) and planned high-dose radiation via tandom ring direct to cervix to start 11-09-2015   Chemotherapy induced diarrhea    Chemotherapy-induced nausea    Chronic deep vein thrombosis (DVT) of right lower extremity (HCC) 01/15/2024   Deep vein  thrombosis, upper right extremity (HCC) 11-25-2015 dx   deep and superficial involving axillary vein, brachial vein, and basilic vein   Depression    DVT (deep venous thrombosis) (HCC)    Dysuria    Factor V Leiden    GERD (gastroesophageal reflux disease)    caused by chemotherapy   Headache    History of  thrombophlebitis RESOLVED-- but per pt prefer no bp or  puncture done in right arm   per pt symptoms 10-20-2015-- on 10-22-2015 dx Superficial thrombus RUE of right basilic vein,where IV access was difficult for first peripheral chemo (not deep venous system) / symptoms resolved with local measures   HLD (hyperlipidemia)    HTN (hypertension)    Hypothyroidism    Port-A-Cath in place    Radiation    currently having radiation therapy started 10-04-2015   Radiation 10/04/15-11/19/15   pelvis 45 Gy, boost to 9 Gy   Radiation 11/09/15-12/07/15   bracytherapy cervical - 27.5 Gy in 5 fractions   Superficial thrombophlebitis of arm 11/25/2015   right upper arm-- secondary to IV access difficult for first chemo    SURGICAL HISTORY: Past Surgical History:  Procedure Laterality Date   CARPAL TUNNEL RELEASE Bilateral 2002 and 2009   CESAREAN SECTION  1991    DILATION AND CURETTAGE OF UTERUS N/A 09/03/2015   Procedure: EXAM UNDER ANESTHESIA  WITH FROZEN SECTION CERVICAL BIOPSY & ENDOCERVICAL CURRETTINGS;  Surgeon: Burnard Bowers, MD;  Location: [facility];  Service: Gynecology;  Laterality: N/A;   ECHO STRESS TEST  12-10-2014   normal w/ no evidence for stress-induced ischemia/  normal LV function and wall motion , ef 65-70%   IR GENERIC HISTORICAL  10/25/2015   IR RADIOLOGIST EVAL & MGMT 10/25/2015 Ami Bellman, DO [facility]   IR GENERIC HISTORICAL  12/20/2015   IR REMOVAL TUN ACCESS W/ PORT W/O FL MOD SED 12/20/2015 Norleen Roulette, MD [facility]   LASIK Left 2015   PORTACATH PLACEMENT  10-11-2015   TANDEM RING INSERTION N/A 11/09/2015   Procedure: TANDEM RING INSERTION;  Surgeon: Lynwood Nasuti, MD;  Location: [facility];  Service: Urology;  Laterality: N/A;   TANDEM RING INSERTION N/A 11/16/2015   Procedure: TANDEM RING INSERTION;  Surgeon: Lynwood Nasuti, MD;  Location: [facility];  Service: Urology;  Laterality: N/A;   TANDEM RING INSERTION N/A 11/23/2015   Procedure: TANDEM  RING INSERTION;  Surgeon: Lynwood Nasuti, MD;  Location: [facility];  Service: Urology;  Laterality: N/A;   TANDEM RING INSERTION N/A 11/30/2015   Procedure: TANDEM RING INSERTION;  Surgeon: Lynwood Nasuti, MD;  Location: [facility];  Service: Urology;  Laterality: N/A;   TANDEM RING INSERTION N/A 12/07/2015   Procedure: TANDEM RING INSERTION;  Surgeon: Lynwood Nasuti, MD;  Location: [facility];  Service: Urology;  Laterality: N/A;   TUBAL LIGATION  1996    SOCIAL HISTORY: Social History   Socioeconomic History   Marital status: Married    Spouse name: Not on file   Number of children: 1   Years of education: Not on file   Highest education level: Not on file  Occupational History   Occupation: stay at home  Tobacco Use   Smoking status: Never   Smokeless tobacco: Never  Vaping Use   Vaping status: Never Used  Substance and Sexual Activity   Alcohol use: No    Alcohol/week: 0.0 standard drinks of alcohol   Drug use: No  Sexual activity: Yes    Birth control/protection: Surgical  Other Topics Concern   Not on file  Social History Narrative   Not on file   Social Drivers of Health   Financial Resource Strain: Not on file  Food Insecurity: No Food Insecurity (02/06/2024)   Hunger Vital Sign    Worried About Running Out of Food in the Last Year: Never true    Ran Out of Food in the Last Year: Never true  Transportation Needs: No Transportation Needs (02/06/2024)   PRAPARE - Administrator, Civil Service (Medical): No    Lack of Transportation (Non-Medical): No  Physical Activity: Not on file  Stress: Not on file  Social Connections: Unknown (06/06/2022)   Received from [facility]   Social Network    Social Network: Not on file  Intimate Partner Violence: Not At Risk (02/06/2024)   Humiliation, Afraid, Rape, and Kick questionnaire    Fear of Current or Ex-Partner: No    Emotionally Abused: No    Physically Abused:  No    Sexually Abused: No    FAMILY HISTORY: Family History  Problem Relation Age of Onset   CAD Mother        Premature disease   Lung cancer Mother    Clotting disorder Mother    Heart disease Mother    Lung cancer Father    Brain cancer Father    Hypertension Father    Gallstones Father    Clotting disorder Father    COPD Half-Sister    Alcoholism Paternal Grandmother    Alcoholism Paternal Grandfather    Heart attack Half-Brother    Anxiety disorder Daughter    Depression Daughter    Hypertension Daughter     ALLERGIES:  is allergic to zofran  [ondansetron  hcl].  MEDICATIONS:  Current Outpatient Medications  Medication Sig Dispense Refill   amLODipine (NORVASC) 10 MG tablet Take 10 mg by mouth daily.     amLODipine (NORVASC) 5 MG tablet Take 5 mg by mouth daily.     atorvastatin (LIPITOR) 40 MG tablet Take 40 mg by mouth at bedtime.     ELIQUIS  5 MG TABS tablet Take 5 mg by mouth 2 (two) times daily.      estrogen, conjugated,-medroxyprogesterone (PREMPRO) 0.625-2.5 MG tablet Take 1 tablet by mouth daily.     HYDROcodone -acetaminophen  (NORCO) 10-325 MG tablet Take 1 tablet by mouth 3 (three) times daily as needed.     levothyroxine (SYNTHROID) 100 MCG tablet Take 100 mcg by mouth daily before breakfast.     pregabalin (LYRICA) 100 MG capsule Take 100 mg by mouth as needed.     trimethoprim (TRIMPEX) 100 MG tablet Take 100 mg by mouth daily.   10   VALTREX 1 g tablet as needed.     venlafaxine  XR (EFFEXOR -XR) 75 MG 24 hr capsule Take 75 mg by mouth daily.     zolpidem (AMBIEN) 10 MG tablet 1 tablet at bedtime as needed     ferrous sulfate 325 (65 FE) MG tablet Take 325 mg by mouth daily.     No current facility-administered medications for this visit.    PHYSICAL EXAMINATION: ECOG PERFORMANCE STATUS: 1 - Symptomatic but completely ambulatory  Vitals:   02/06/24 1435  BP: 138/86  Pulse: 82  Resp: 17  Temp: 97.7 F (36.5 C)  SpO2: 98%   Filed Weights    02/06/24 1435  Weight: 234 lb 14.4 oz (106.5 kg)    GENERAL:alert, no distress  and comfortable SKIN: skin color, texture, turgor are normal, no rashes or significant lesions EYES: normal, conjunctiva are pink and non-injected, sclera clear OROPHARYNX:no exudate, no erythema and lips, buccal mucosa, and tongue normal  NECK: supple, thyroid  normal size, non-tender, without nodularity LYMPH:  no palpable lymphadenopathy in the cervical, axillary or inguinal LUNGS: clear to auscultation and percussion with normal breathing effort HEART: regular rate & rhythm and no murmurs and no lower extremity edema ABDOMEN:abdomen soft, non-tender and normal bowel sounds Musculoskeletal:no cyanosis of digits and no clubbing  PSYCH: alert & oriented x 3 with fluent speech NEURO: no focal motor/sensory deficits  LABORATORY DATA:  I have reviewed the data as listed    Latest Ref Rng & Units 01/15/2024   11:42 AM 04/15/2022    9:57 PM 03/16/2018    4:52 PM  CBC  WBC 4.0 - 10.5 K/uL 5.4  7.9  5.9   Hemoglobin 12.0 - 15.0 g/dL 89.7  7.1  89.2   Hematocrit 36.0 - 46.0 % 32.6  27.8  35.5   Platelets 150.0 - 400.0 K/uL 425.0  419  450        Latest Ref Rng & Units 01/15/2024   11:42 AM 04/15/2022    9:57 PM 07/23/2018    8:26 AM  CMP  Glucose 70 - 99 mg/dL 873  894  898   BUN 6 - 23 mg/dL 16  13  11    Creatinine 0.40 - 1.20 mg/dL 8.92  8.87  8.99   Sodium 135 - 145 mEq/L 137  136  140   Potassium 3.5 - 5.1 mEq/L 4.2  3.6  3.4   Chloride 96 - 112 mEq/L 100  102  104   CO2 19 - 32 mEq/L 26  24  24    Calcium 8.4 - 10.5 mg/dL 9.5  9.0  9.0     Assessment and Plan Iron deficiency anemia, unspecified iron deficiency anemia type Assessment & Plan: She does have history of factor V Leiden which was prevoiusly followed by hematologist Dr. Roxan at [facility].  She does take Eliquis  daily.  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  She is on anticoagulation indefinitely.  She was referred by  her GI provider, Ellouise Console, PA.  The patient has been having iron deficiency anemia due to chronic GI blood loss.  She has previously been hospitalized due to severe anemia in 05/2022.  She required blood transfusion.  She had EGD and colonoscopy in 05/2022 while hospitalized.  She currently takes oral iron 325 mg once daily.  She does have chronic constipation.  Most recent labs from 01/15/2024 show her Hgb 10.2, HCT 32.6, MCV 72.8, and platelets 425.  Her iron was low at 23, TIBC 521, 4% saturation, and ferritin was 7.  Her B12 and folate were both normal.  She also had celiac panel which was negative.  She was also negative for gluten allergy. It is felt that her iron deficiency is due to slow GI bleed.  Requirement for Eliquis  does make her more at risk for GI bleeding. Will arrange for IV iron for her at [facility]. IV iron does have minimal risk of negative side effects, mainly, allergic reactions. Reactions can consist of general pruritus and rash. Can include full anaphylactic reaction. I do give premedications (Tylenol  and benadryl) to reduce risk of allergic reactions. The patient voiced understanding of all information and agrees to move forward with treatment.   Plan  to check labs every 4 weeks continue with IV iron treatments as indicated.  Labs and follow-up in 3 months, sooner if needed.    Other orders -     Acetaminophen  -     diphenhydrAMINE HCl -     Iron Sucrose -     Famotidine  in NaCl -     Sodium Chloride  -     methylPREDNISolone Sodium Succ -     diphenhydrAMINE HCl -     Albuterol Sulfate HFA -     EPINEPHrine      This was a shared visit with Dr. Lanny. All questions were answered. The patient knows to call the clinic with any problems, questions or concerns.      Powell FORBES Lessen, NP 02/06/2024 7:45 PM    Addendum I have seen the patient, examined her. I agree with the assessment and and plan and have edited the notes.   Patient is a  53 year old female with past medical history of cervical cancer, status post chemo and radiation, in remission, history of DVT in right lower extremity, and has been on Eliquis  for many years.  She developed significant anemia and iron deficiency since early 2024, and required hospital admission and blood transfusion.  She had EGD and colonoscopy in early 2024 which was poorly prepped but unremarkable.  She has seen GI Dr. Leigh and plan to repeat colonoscopy soon.  No other signs of bleeding.  Celiac panel was negative.  I suspect her iron deficiency is related to slow GI bleeding, probably related to her Eliquis .  She has been on oral iron, but she still has persistent anemia and iron deficiency.  Will schedule IV iron for her, and monitor her CBC and iron level periodically.  I encouraged her to continue follow-up with GI to complete GI workup.  All questions were answered.  Onita Lanny MD 02/06/2024

## 2024-02-06 ENCOUNTER — Inpatient Hospital Stay

## 2024-02-06 ENCOUNTER — Encounter: Payer: Self-pay | Admitting: Oncology

## 2024-02-06 ENCOUNTER — Inpatient Hospital Stay: Attending: Nurse Practitioner | Admitting: Nurse Practitioner

## 2024-02-06 VITALS — BP 138/86 | HR 82 | Temp 97.7°F | Resp 17 | Wt 234.9 lb

## 2024-02-06 DIAGNOSIS — D6851 Activated protein C resistance: Secondary | ICD-10-CM | POA: Diagnosis not present

## 2024-02-06 DIAGNOSIS — D500 Iron deficiency anemia secondary to blood loss (chronic): Secondary | ICD-10-CM | POA: Diagnosis present

## 2024-02-06 DIAGNOSIS — Z7901 Long term (current) use of anticoagulants: Secondary | ICD-10-CM | POA: Insufficient documentation

## 2024-02-06 DIAGNOSIS — D509 Iron deficiency anemia, unspecified: Secondary | ICD-10-CM

## 2024-02-06 DIAGNOSIS — Z8541 Personal history of malignant neoplasm of cervix uteri: Secondary | ICD-10-CM | POA: Diagnosis not present

## 2024-02-06 DIAGNOSIS — Z86718 Personal history of other venous thrombosis and embolism: Secondary | ICD-10-CM | POA: Diagnosis not present

## 2024-02-06 DIAGNOSIS — K922 Gastrointestinal hemorrhage, unspecified: Secondary | ICD-10-CM | POA: Diagnosis present

## 2024-02-08 ENCOUNTER — Telehealth: Payer: Self-pay | Admitting: *Deleted

## 2024-02-08 NOTE — Telephone Encounter (Signed)
 Per Tina's OV note pt is on Eliquis  and is seen by hematologist at [facility] for factor V leiden. Called and left message for pt to call back to confirm so that coag clearance can be sent to the correct office.

## 2024-02-11 NOTE — Telephone Encounter (Signed)
 Request sent to Dorothey Blinda Cassette NP to hold Eliquis  for 2 days prior to procedure scheduled on 03/02/24. Faxed to [phone]

## 2024-02-14 ENCOUNTER — Telehealth: Payer: Self-pay | Admitting: *Deleted

## 2024-02-14 ENCOUNTER — Ambulatory Visit

## 2024-02-14 NOTE — Telephone Encounter (Signed)
 Attempt to reach Mobile # pt for pre-visit. LM with call back #.   Will attempt other number in profile no answer  Will attempt to reach  mobile again in 5 min due to no other # listed in profile  Third attempt to reach pt for pre-vist unsuccessful. LM with facility # for pt to call back. Instructed pt to call # given by end of the day and reschedule the pre-visit  with RN or the scheduled procedure will be canceled.

## 2024-02-14 NOTE — Progress Notes (Unsigned)
 Unable to reach after 3 attempts no show letter sent to My chart

## 2024-02-20 NOTE — Telephone Encounter (Signed)
 Spoke with office and was told they thought it had been sent but she could not find letter. Refaxed letter through epic requesting clearance to hold Eliquis  for 2 days prior to procedure.

## 2024-02-24 ENCOUNTER — Encounter: Payer: Self-pay | Admitting: Nurse Practitioner

## 2024-02-24 DIAGNOSIS — D509 Iron deficiency anemia, unspecified: Secondary | ICD-10-CM | POA: Insufficient documentation

## 2024-02-24 NOTE — Assessment & Plan Note (Addendum)
 She does have history of factor V Leiden which was prevoiusly followed by hematologist Dr. Roxan at [facility].  She does take Eliquis  daily.  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  She is on anticoagulation indefinitely.  She was referred by her GI provider, Ellouise Console, PA.  The patient has been having iron deficiency anemia due to chronic GI blood loss.  She has previously been hospitalized due to severe anemia in 05/2022.  She required blood transfusion.  She had EGD and colonoscopy in 05/2022 while hospitalized.  She currently takes oral iron 325 mg once daily.  She does have chronic constipation.  Most recent labs from 01/15/2024 show her Hgb 10.2, HCT 32.6, MCV 72.8, and platelets 425.  Her iron was low at 23, TIBC 521, 4% saturation, and ferritin was 7.  Her B12 and folate were both normal.  She also had celiac panel which was negative.  She was also negative for gluten allergy. It is felt that her iron deficiency is due to slow GI bleed.  Requirement for Eliquis  does make her more at risk for GI bleeding. Will arrange for IV iron for her at [facility]. IV iron does have minimal risk of negative side effects, mainly, allergic reactions. Reactions can consist of general pruritus and rash. Can include full anaphylactic reaction. I do give premedications (Tylenol  and benadryl) to reduce risk of allergic reactions. The patient voiced understanding of all information and agrees to move forward with treatment.   Plan to check labs every 4 weeks continue with IV iron treatments as indicated.  Labs and follow-up in 3 months, sooner if needed.

## 2024-02-25 ENCOUNTER — Telehealth: Payer: Self-pay | Admitting: Pharmacy Technician

## 2024-02-25 ENCOUNTER — Encounter: Payer: Self-pay | Admitting: Nurse Practitioner

## 2024-02-25 ENCOUNTER — Encounter: Payer: Self-pay | Admitting: Oncology

## 2024-02-25 NOTE — Telephone Encounter (Signed)
 Auth Submission: NO AUTH NEEDED Site of care: Site of care: [facility] Payer: [employer] Medication & CPT/J Code(s) submitted: Venofer (Iron Sucrose) J1756 Diagnosis Code: D50.9 Route of submission (phone, fax, portal):  Phone # Fax # Auth type: Buy/Bill PB Units/visits requested: X5 DOSES Reference number:  Approval from: 02/25/24 to 04/23/24

## 2024-02-26 ENCOUNTER — Encounter: Admitting: Gastroenterology

## 2024-02-26 NOTE — Telephone Encounter (Signed)
 Received call from md office that may hold Eliquis  for 2 days prior to procedure. Per office note faxed to [phone].

## 2024-02-26 NOTE — Telephone Encounter (Signed)
 Called pcp office to check on eliquis  clearance again, office states they will try to get this faxed today.

## 2024-03-03 ENCOUNTER — Ambulatory Visit

## 2024-03-03 MED ORDER — DIPHENHYDRAMINE HCL 25 MG PO CAPS: 25.0000 mg | ORAL_CAPSULE | Freq: Once | ORAL | Status: DC

## 2024-03-03 MED ORDER — IRON SUCROSE 200 MG IVPB - SIMPLE MED: 200.0000 mg | Freq: Once | Status: DC

## 2024-03-03 MED ORDER — ACETAMINOPHEN 325 MG PO TABS: 650.0000 mg | ORAL_TABLET | Freq: Once | ORAL | Status: DC

## 2024-03-03 NOTE — Progress Notes (Unsigned)
 Not seen. Pt sick and rescheduled

## 2024-03-04 ENCOUNTER — Encounter: Payer: Self-pay | Admitting: Nurse Practitioner

## 2024-03-04 ENCOUNTER — Encounter: Payer: Self-pay | Admitting: Oncology

## 2024-03-05 ENCOUNTER — Inpatient Hospital Stay: Attending: Nurse Practitioner

## 2024-03-05 DIAGNOSIS — K922 Gastrointestinal hemorrhage, unspecified: Secondary | ICD-10-CM | POA: Diagnosis present

## 2024-03-05 DIAGNOSIS — D500 Iron deficiency anemia secondary to blood loss (chronic): Secondary | ICD-10-CM | POA: Insufficient documentation

## 2024-03-05 DIAGNOSIS — D509 Iron deficiency anemia, unspecified: Secondary | ICD-10-CM

## 2024-03-05 LAB — CBC WITH DIFFERENTIAL (CANCER CENTER ONLY)
Abs Immature Granulocytes: 0.02 K/uL (ref 0.00–0.07)
Basophils Absolute: 0.1 K/uL (ref 0.0–0.1)
Basophils Relative: 1 %
Eosinophils Absolute: 0.1 K/uL (ref 0.0–0.5)
Eosinophils Relative: 1 %
HCT: 34.9 % — ABNORMAL LOW (ref 36.0–46.0)
Hemoglobin: 10.7 g/dL — ABNORMAL LOW (ref 12.0–15.0)
Immature Granulocytes: 0 %
Lymphocytes Relative: 18 %
Lymphs Abs: 1.2 K/uL (ref 0.7–4.0)
MCH: 22.8 pg — ABNORMAL LOW (ref 26.0–34.0)
MCHC: 30.7 g/dL (ref 30.0–36.0)
MCV: 74.4 fL — ABNORMAL LOW (ref 80.0–100.0)
Monocytes Absolute: 0.5 K/uL (ref 0.1–1.0)
Monocytes Relative: 7 %
Neutro Abs: 4.8 K/uL (ref 1.7–7.7)
Neutrophils Relative %: 73 %
Platelet Count: 429 K/uL — ABNORMAL HIGH (ref 150–400)
RBC: 4.69 MIL/uL (ref 3.87–5.11)
RDW: 14.7 % (ref 11.5–15.5)
WBC Count: 6.6 K/uL (ref 4.0–10.5)
nRBC: 0 % (ref 0.0–0.2)

## 2024-03-05 LAB — FERRITIN: Ferritin: 11 ng/mL (ref 11–307)

## 2024-03-05 LAB — IRON AND IRON BINDING CAPACITY (CC-WL,HP ONLY)
Iron: 35 ug/dL (ref 28–170)
Saturation Ratios: 6 % — ABNORMAL LOW (ref 10.4–31.8)
TIBC: 581 ug/dL — ABNORMAL HIGH (ref 250–450)
UIBC: 546 ug/dL — ABNORMAL HIGH (ref 148–442)

## 2024-03-05 LAB — VITAMIN B12: Vitamin B-12: 555 pg/mL (ref 180–914)

## 2024-03-10 ENCOUNTER — Ambulatory Visit

## 2024-03-11 ENCOUNTER — Ambulatory Visit

## 2024-03-11 VITALS — Ht 62.0 in | Wt 225.0 lb

## 2024-03-11 VITALS — BP 113/73 | HR 79 | Temp 97.6°F | Resp 18 | Ht 62.0 in | Wt 229.4 lb

## 2024-03-11 DIAGNOSIS — D6851 Activated protein C resistance: Secondary | ICD-10-CM

## 2024-03-11 DIAGNOSIS — D500 Iron deficiency anemia secondary to blood loss (chronic): Secondary | ICD-10-CM

## 2024-03-11 DIAGNOSIS — D509 Iron deficiency anemia, unspecified: Secondary | ICD-10-CM

## 2024-03-11 MED ORDER — ACETAMINOPHEN 325 MG PO TABS
650.0000 mg | ORAL_TABLET | Freq: Once | ORAL | Status: AC
  Administered 2024-03-11: 650 mg via ORAL
  Filled 2024-03-11: qty 2

## 2024-03-11 MED ORDER — ONDANSETRON HCL 4 MG PO TABS: 4.0000 mg | ORAL_TABLET | ORAL | 0 refills | Status: AC

## 2024-03-11 MED ORDER — IRON SUCROSE 200 MG IVPB - SIMPLE MED
200.0000 mg | Freq: Once | Status: AC
  Administered 2024-03-11: 200 mg via INTRAVENOUS
  Filled 2024-03-11: qty 110

## 2024-03-11 MED ORDER — DIPHENHYDRAMINE HCL 25 MG PO CAPS
25.0000 mg | ORAL_CAPSULE | Freq: Once | ORAL | Status: AC
  Administered 2024-03-11: 25 mg via ORAL
  Filled 2024-03-11: qty 1

## 2024-03-11 MED ORDER — SUTAB 1479-225-188 MG PO TABS: 12.0000 | ORAL_TABLET | ORAL | 0 refills | Status: AC

## 2024-03-11 NOTE — Progress Notes (Signed)

## 2024-03-11 NOTE — Patient Instructions (Signed)

## 2024-03-11 NOTE — Progress Notes (Signed)
 Diagnosis: Acute Anemia  Provider:  Lonna Coder MD  Procedure: IV Infusion  IV Type: Peripheral, IV Location: L Hand  Venofer (Iron Sucrose), Dose: 200 mg  Infusion Start Time: 1605  Infusion Stop Time: 1625  Post Infusion IV Care: Patient declined observation and Peripheral IV Discontinued  Discharge: Condition: Good, Destination: Home . AVS Declined  Performed by:  Donny Childes, RN

## 2024-03-17 ENCOUNTER — Ambulatory Visit (INDEPENDENT_AMBULATORY_CARE_PROVIDER_SITE_OTHER): Admitting: *Deleted

## 2024-03-17 ENCOUNTER — Encounter: Payer: Self-pay | Admitting: Gastroenterology

## 2024-03-17 ENCOUNTER — Ambulatory Visit

## 2024-03-17 VITALS — BP 129/80 | HR 81 | Temp 97.6°F | Resp 16 | Ht 62.0 in | Wt 230.2 lb

## 2024-03-17 DIAGNOSIS — D500 Iron deficiency anemia secondary to blood loss (chronic): Secondary | ICD-10-CM | POA: Diagnosis not present

## 2024-03-17 MED ORDER — ACETAMINOPHEN 325 MG PO TABS
650.0000 mg | ORAL_TABLET | Freq: Once | ORAL | Status: AC
  Administered 2024-03-17: 650 mg via ORAL
  Filled 2024-03-17: qty 2

## 2024-03-17 MED ORDER — DIPHENHYDRAMINE HCL 25 MG PO CAPS
25.0000 mg | ORAL_CAPSULE | Freq: Once | ORAL | Status: AC
  Administered 2024-03-17: 25 mg via ORAL
  Filled 2024-03-17: qty 1

## 2024-03-17 MED ORDER — SODIUM CHLORIDE 0.9 % IV SOLN
200.0000 mg | Freq: Once | INTRAVENOUS | Status: AC
  Administered 2024-03-17: 200 mg via INTRAVENOUS
  Filled 2024-03-17: qty 10

## 2024-03-17 NOTE — Progress Notes (Signed)
 Diagnosis:  Iron  Deficiency Anemia  Provider:  Mannam, Praveen MD  Procedure: IV Infusion  IV Type: Peripheral, IV Location: L Antecubital   Actemra (Tocilizumab), Venofer  (Iron  Sucrose), Dose: 200 mg  Infusion Start Time: 1503 pm  Infusion Stop Time: 1528 pm  Post Infusion IV Care: Observation period completed and Peripheral IV Discontinued  Discharge: Condition: Good, Destination: Home . AVS Declined  Performed by:  Trudy Lamarr LABOR, RN

## 2024-03-24 ENCOUNTER — Encounter: Payer: Self-pay | Admitting: Nurse Practitioner

## 2024-03-24 ENCOUNTER — Ambulatory Visit

## 2024-03-24 ENCOUNTER — Encounter: Payer: Self-pay | Admitting: Oncology

## 2024-03-25 ENCOUNTER — Telehealth: Payer: Self-pay | Admitting: Gastroenterology

## 2024-03-25 ENCOUNTER — Encounter: Admitting: Gastroenterology

## 2024-03-25 NOTE — Telephone Encounter (Signed)
Attempted to return pts call. No answer. Left message

## 2024-03-25 NOTE — Telephone Encounter (Signed)
 Made a second attempt to reach pt. Still no answer. Left another message on cell phone. Attempted to call house number listed, no answer and unable to leave a message. Requested pt to call back.

## 2024-03-25 NOTE — Telephone Encounter (Signed)
 Inbound call from patient stating that she has been throwing up her prep medication and has not been able to clean out. Patient is schedule for a colonoscopy today at 12:30 PM. Patient is requesting a call back to advise her what she needs to do. Please advise.

## 2024-03-25 NOTE — Telephone Encounter (Signed)
 Called one last time and did not get her.  LVM for her to call us  back.

## 2024-03-25 NOTE — Telephone Encounter (Signed)
 Made a fourth attempt to reach the pt. Still no answer. Left message for pt to call back to inform if she is coming for colonoscopy today or if she needs to be rescheduled.

## 2024-03-25 NOTE — Telephone Encounter (Signed)
 Okay thank you Harlene.

## 2024-03-26 NOTE — Telephone Encounter (Signed)
 error

## 2024-03-31 ENCOUNTER — Ambulatory Visit

## 2024-03-31 MED ORDER — ACETAMINOPHEN 325 MG PO TABS: 650.0000 mg | ORAL_TABLET | Freq: Once | ORAL | Status: DC

## 2024-03-31 MED ORDER — DIPHENHYDRAMINE HCL 25 MG PO CAPS: 25.0000 mg | ORAL_CAPSULE | Freq: Once | ORAL | Status: DC

## 2024-03-31 MED ORDER — IRON SUCROSE 200 MG IVPB - SIMPLE MED: 200.0000 mg | Freq: Once | Status: DC

## 2024-04-01 ENCOUNTER — Encounter: Payer: Self-pay | Admitting: Nurse Practitioner

## 2024-04-01 ENCOUNTER — Encounter: Payer: Self-pay | Admitting: Oncology

## 2024-04-02 ENCOUNTER — Inpatient Hospital Stay: Attending: Nurse Practitioner

## 2024-04-07 ENCOUNTER — Ambulatory Visit

## 2024-04-09 ENCOUNTER — Ambulatory Visit

## 2024-04-09 VITALS — BP 150/84 | HR 66 | Temp 97.9°F | Resp 18 | Ht 62.0 in | Wt 227.4 lb

## 2024-04-09 DIAGNOSIS — D500 Iron deficiency anemia secondary to blood loss (chronic): Secondary | ICD-10-CM | POA: Diagnosis not present

## 2024-04-09 MED ORDER — ACETAMINOPHEN 325 MG PO TABS: 650.0000 mg | ORAL_TABLET | Freq: Once | ORAL | Status: DC

## 2024-04-09 MED ORDER — IRON SUCROSE 200 MG IVPB - SIMPLE MED
200.0000 mg | Freq: Once | Status: AC
  Administered 2024-04-09: 14:00:00 200 mg via INTRAVENOUS
  Filled 2024-04-09: qty 110

## 2024-04-09 MED ORDER — DIPHENHYDRAMINE HCL 25 MG PO CAPS: 25.0000 mg | ORAL_CAPSULE | Freq: Once | ORAL | Status: DC

## 2024-04-09 NOTE — Progress Notes (Signed)
 Diagnosis: Iron  Deficiency Anemia  Provider:  Praveen Mannam MD  Procedure: IV Infusion  IV Type: Peripheral, IV Location: L Antecubital   Venofer  (Iron  Sucrose), Dose: 200 mg  Infusion Start Time: 1400  Infusion Stop Time: 1423  Post Infusion IV Care: Patient declined observation and Peripheral IV Discontinued  Discharge: Condition: Good, Destination: Home . AVS Declined  Performed by:  Maximiano JONELLE Pouch, LPN

## 2024-04-14 ENCOUNTER — Ambulatory Visit

## 2024-04-14 VITALS — BP 140/82 | HR 69 | Temp 97.5°F | Resp 20 | Ht 62.0 in | Wt 230.8 lb

## 2024-04-14 DIAGNOSIS — D500 Iron deficiency anemia secondary to blood loss (chronic): Secondary | ICD-10-CM | POA: Diagnosis not present

## 2024-04-14 MED ORDER — IRON SUCROSE 200 MG IVPB - SIMPLE MED
200.0000 mg | Freq: Once | Status: AC
  Administered 2024-04-14: 200 mg via INTRAVENOUS
  Filled 2024-04-14: qty 110

## 2024-04-14 NOTE — Progress Notes (Signed)
 Diagnosis: Iron  Deficiency Anemia  Provider:  Praveen Mannam MD  Procedure: IV Infusion  IV Type: Peripheral, IV Location: L Antecubital   Venofer  (Iron  Sucrose), Dose: 200 mg  Infusion Start Time: 1447  Infusion Stop Time: 1503  Post Infusion IV Care: Patient declined observation and Peripheral IV Discontinued  Discharge: Condition: Good, Destination: Home . AVS Declined  Performed by:  Leita FORBES Miles, LPN

## 2024-04-22 ENCOUNTER — Ambulatory Visit

## 2024-04-22 MED ORDER — IRON SUCROSE 200 MG IVPB - SIMPLE MED: 200.0000 mg | Freq: Once | Status: DC

## 2024-04-23 ENCOUNTER — Encounter: Payer: Self-pay | Admitting: Nurse Practitioner

## 2024-04-23 ENCOUNTER — Encounter: Payer: Self-pay | Admitting: Oncology

## 2024-04-30 ENCOUNTER — Ambulatory Visit

## 2024-04-30 ENCOUNTER — Other Ambulatory Visit: Payer: Self-pay | Admitting: Nurse Practitioner

## 2024-04-30 DIAGNOSIS — D509 Iron deficiency anemia, unspecified: Secondary | ICD-10-CM

## 2024-04-30 MED ORDER — IRON SUCROSE 200 MG IVPB - SIMPLE MED: 200.0000 mg | Freq: Once | Status: DC

## 2024-04-30 NOTE — Assessment & Plan Note (Signed)
 She does have history of factor V Leiden which was prevoiusly followed by hematologist Dr. Roxan at [facility].  She does take Eliquis  daily.  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  She is on anticoagulation indefinitely.  She was referred by her GI provider, Ellouise Console, PA.  The patient has been having iron deficiency anemia due to chronic GI blood loss.  She has previously been hospitalized due to severe anemia in 05/2022.  She required blood transfusion.  She had EGD and colonoscopy in 05/2022 while hospitalized.  She currently takes oral iron 325 mg once daily.  She does have chronic constipation.  Most recent labs from 01/15/2024 show her Hgb 10.2, HCT 32.6, MCV 72.8, and platelets 425.  Her iron was low at 23, TIBC 521, 4% saturation, and ferritin was 7.  Her B12 and folate were both normal.  She also had celiac panel which was negative.  She was also negative for gluten allergy. It is felt that her iron deficiency is due to slow GI bleed.  Requirement for Eliquis  does make her more at risk for GI bleeding. Will arrange for IV iron for her at [facility]. IV iron does have minimal risk of negative side effects, mainly, allergic reactions. Reactions can consist of general pruritus and rash. Can include full anaphylactic reaction. I do give premedications (Tylenol  and benadryl) to reduce risk of allergic reactions. The patient voiced understanding of all information and agrees to move forward with treatment.   Plan to check labs every 4 weeks continue with IV iron treatments as indicated.  Labs and follow-up in 3 months, sooner if needed.

## 2024-04-30 NOTE — Progress Notes (Unsigned)
 " Patient Care Team: Renato Dorothey HERO, NP as PCP - General (Internal Medicine)  Clinic Day:  04/30/2024  Referring physician: Renato Dorothey HERO, NP  ASSESSMENT & PLAN:   Assessment & Plan: Iron  deficiency anemia She does have history of factor V Leiden which was prevoiusly followed by hematologist Dr. Roxan at [facility].  She does take Eliquis  daily.  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  She is on anticoagulation indefinitely.  She was referred by her GI provider, Ellouise Console, PA.  The patient has been having iron  deficiency anemia due to chronic GI blood loss.  She has previously been hospitalized due to severe anemia in 05/2022.  She required blood transfusion.  She had EGD and colonoscopy in 05/2022 while hospitalized.  She currently takes oral iron  325 mg once daily.  She does have chronic constipation.  Most recent labs from 01/15/2024 show her Hgb 10.2, HCT 32.6, MCV 72.8, and platelets 425.  Her iron  was low at 23, TIBC 521, 4% saturation, and ferritin was 7.  Her B12 and folate were both normal.  She also had celiac panel which was negative.  She was also negative for gluten allergy. It is felt that her iron  deficiency is due to slow GI bleed.  Requirement for Eliquis  does make her more at risk for GI bleeding. Will arrange for IV iron  for her at [facility]. IV iron  does have minimal risk of negative side effects, mainly, allergic reactions. Reactions can consist of general pruritus and rash. Can include full anaphylactic reaction. I do give premedications (Tylenol  and benadryl ) to reduce risk of allergic reactions. The patient voiced understanding of all information and agrees to move forward with treatment.   Plan to check labs every 4 weeks continue with IV iron  treatments as indicated.  Labs and follow-up in 3 months, sooner if needed.    The patient understands the plans discussed today and is in agreement with them.  She knows to contact our  office if she develops concerns prior to her next appointment.  I provided *** minutes of face-to-face time during this encounter and > 50% was spent counseling as documented under my assessment and plan.    Powell FORBES Lessen, NP  [facility] - A DEPT OF [facility] [address] [place] [address] Dept: [phone] Dept Fax: [phone]   No orders of the defined types were placed in this encounter.     CHIEF COMPLAINT:  CC: Iron  deficiency anemia  Current Treatment: IV iron  as needed ferritin <50  INTERVAL HISTORY:  Lindsey Hughes is here today for repeat clinical assessment.  Her initial visit with me was 02/06/2024.  She has received IV iron  since then.  Most recent infusion was 04/30/2024.  She denies fevers or chills. She denies pain. Her appetite is good. Her weight {Weight change:10426}.  I have reviewed the past medical history, past surgical history, social history and family history with the patient and they are unchanged from previous note.  ALLERGIES:  is allergic to zofran  [ondansetron  hcl].  MEDICATIONS:  Current Outpatient Medications  Medication Sig Dispense Refill   amLODipine (NORVASC) 10 MG tablet Take 10 mg by mouth daily.     amLODipine (NORVASC) 5 MG tablet Take 5 mg by mouth daily.     atorvastatin (LIPITOR) 40 MG tablet Take 40 mg by mouth at bedtime.     ELIQUIS   5 MG TABS tablet Take 5 mg by mouth 2 (two) times daily.      estrogen, conjugated,-medroxyprogesterone (PREMPRO) 0.625-2.5 MG tablet Take 1 tablet by mouth daily.     ferrous sulfate 325 (65 FE) MG tablet Take 325 mg by mouth daily.     HYDROcodone -acetaminophen  (NORCO) 10-325 MG tablet Take 1 tablet by mouth 3 (three) times daily as needed.     levothyroxine (SYNTHROID) 100 MCG tablet Take 100 mcg by mouth daily before breakfast.     ondansetron  (ZOFRAN ) 4 MG tablet Take 1 tablet (4 mg total) by mouth as directed. Take one Zofran  4  mg tablet 30-60 minutes before each colonoscopy prep dose 4 tablet 0   pregabalin (LYRICA) 100 MG capsule Take 100 mg by mouth as needed.     Sodium Sulfate-Mag Sulfate-KCl (SUTAB ) 1479-225-188 MG TABS Take 12 tablets by mouth as directed. 24 tablet 0   trimethoprim (TRIMPEX) 100 MG tablet Take 100 mg by mouth daily.   10   VALTREX 1 g tablet as needed.     venlafaxine  XR (EFFEXOR -XR) 75 MG 24 hr capsule Take 75 mg by mouth daily.     zolpidem (AMBIEN) 10 MG tablet 1 tablet at bedtime as needed     No current facility-administered medications for this visit.   Facility-Administered Medications Ordered in Other Visits  Medication Dose Route Frequency Provider Last Rate Last Admin   iron  sucrose (VENOFER ) 200 mg in sodium chloride  0.9 % 100 mL IVPB  200 mg Intravenous Once Salinda Snedeker, Powell BRAVO, NP        HISTORY OF PRESENT ILLNESS:   Oncology History   No problem history exists.      REVIEW OF SYSTEMS:   Constitutional: Denies fevers, chills or abnormal weight loss Eyes: Denies blurriness of vision Ears, nose, mouth, throat, and face: Denies mucositis or sore throat Respiratory: Denies cough, dyspnea or wheezes Cardiovascular: Denies palpitation, chest discomfort or lower extremity swelling Gastrointestinal:  Denies nausea, heartburn or change in bowel habits Skin: Denies abnormal skin rashes Lymphatics: Denies new lymphadenopathy or easy bruising Neurological:Denies numbness, tingling or new weaknesses Behavioral/Psych: Mood is stable, no new changes  All other systems were reviewed with the patient and are negative.   VITALS:  There were no vitals taken for this visit.  Wt Readings from Last 3 Encounters:  04/14/24 230 lb 12.8 oz (104.7 kg)  04/09/24 227 lb 6.4 oz (103.1 kg)  03/17/24 230 lb 3.2 oz (104.4 kg)    There is no height or weight on file to calculate BMI.  Performance status (ECOG): {CHL ONC [id number]  PHYSICAL EXAM:   GENERAL:alert, no distress and  comfortable SKIN: skin color, texture, turgor are normal, no rashes or significant lesions EYES: normal, Conjunctiva are pink and non-injected, sclera clear OROPHARYNX:no exudate, no erythema and lips, buccal mucosa, and tongue normal  NECK: supple, thyroid  normal size, non-tender, without nodularity LYMPH:  no palpable lymphadenopathy in the cervical, axillary or inguinal LUNGS: clear to auscultation and percussion with normal breathing effort HEART: regular rate & rhythm and no murmurs and no lower extremity edema ABDOMEN:abdomen soft, non-tender and normal bowel sounds Musculoskeletal:no cyanosis of digits and no clubbing  NEURO: alert & oriented x 3 with fluent speech, no focal motor/sensory deficits  LABORATORY DATA:  I have reviewed the data as listed    Component Value Date/Time   NA 137 01/15/2024 1142   NA 141 02/04/2016 1317   K 4.2 01/15/2024 1142   K 3.7 02/04/2016  1317   CL 100 01/15/2024 1142   CO2 26 01/15/2024 1142   CO2 27 02/04/2016 1317   GLUCOSE 126 (H) 01/15/2024 1142   GLUCOSE 95 02/04/2016 1317   BUN 16 01/15/2024 1142   BUN 18.5 02/04/2016 1317   CREATININE 1.07 01/15/2024 1142   CREATININE 1.00 07/23/2018 0826   CREATININE 1.0 02/04/2016 1317   CALCIUM 9.5 01/15/2024 1142   CALCIUM 9.6 02/04/2016 1317   PROT 7.1 03/16/2018 1652   PROT 7.3 02/04/2016 1317   ALBUMIN 3.1 (L) 03/16/2018 1652   ALBUMIN 3.3 (L) 02/04/2016 1317   AST 20 03/16/2018 1652   AST 13 02/04/2016 1317   ALT 14 03/16/2018 1652   ALT 11 02/04/2016 1317   ALKPHOS 97 03/16/2018 1652   ALKPHOS 80 02/04/2016 1317   BILITOT 0.2 (L) 03/16/2018 1652   BILITOT 0.23 02/04/2016 1317   GFRNONAA 59 (L) 04/15/2022 2157   GFRNONAA >60 07/23/2018 0826   GFRAA >60 07/23/2018 0826    No results found for: SPEP, UPEP  Lab Results  Component Value Date   WBC 6.6 03/05/2024   NEUTROABS 4.8 03/05/2024   HGB 10.7 (L) 03/05/2024   HCT 34.9 (L) 03/05/2024   MCV 74.4 (L) 03/05/2024   PLT  429 (H) 03/05/2024      Chemistry      Component Value Date/Time   NA 137 01/15/2024 1142   NA 141 02/04/2016 1317   K 4.2 01/15/2024 1142   K 3.7 02/04/2016 1317   CL 100 01/15/2024 1142   CO2 26 01/15/2024 1142   CO2 27 02/04/2016 1317   BUN 16 01/15/2024 1142   BUN 18.5 02/04/2016 1317   CREATININE 1.07 01/15/2024 1142   CREATININE 1.00 07/23/2018 0826   CREATININE 1.0 02/04/2016 1317      Component Value Date/Time   CALCIUM 9.5 01/15/2024 1142   CALCIUM 9.6 02/04/2016 1317   ALKPHOS 97 03/16/2018 1652   ALKPHOS 80 02/04/2016 1317   AST 20 03/16/2018 1652   AST 13 02/04/2016 1317   ALT 14 03/16/2018 1652   ALT 11 02/04/2016 1317   BILITOT 0.2 (L) 03/16/2018 1652   BILITOT 0.23 02/04/2016 1317       RADIOGRAPHIC STUDIES: I have personally reviewed the radiological images as listed and agreed with the findings in the report. No results found. "

## 2024-05-01 ENCOUNTER — Inpatient Hospital Stay

## 2024-05-01 ENCOUNTER — Inpatient Hospital Stay: Attending: Nurse Practitioner

## 2024-05-01 ENCOUNTER — Encounter: Payer: Self-pay | Admitting: Oncology

## 2024-05-01 ENCOUNTER — Inpatient Hospital Stay: Admitting: Nurse Practitioner

## 2024-05-01 ENCOUNTER — Encounter: Payer: Self-pay | Admitting: Nurse Practitioner

## 2024-05-01 ENCOUNTER — Telehealth: Payer: Self-pay

## 2024-05-01 DIAGNOSIS — D500 Iron deficiency anemia secondary to blood loss (chronic): Secondary | ICD-10-CM

## 2024-05-01 NOTE — Telephone Encounter (Signed)
 Patient did not arrive to her appts today. Spoke with the patient. She has an appointment tomorrow w/ her PCP for FLU Sx. Let patient know to call us  back and let us  know what the findings are so we can move forward with getting her rescheduled. (Patient is aware of the COVID/FLU protocol). Canceling today's appointments .

## 2024-05-12 ENCOUNTER — Encounter: Payer: Self-pay | Admitting: Oncology

## 2024-05-12 ENCOUNTER — Encounter: Payer: Self-pay | Admitting: Nurse Practitioner

## 2024-05-16 ENCOUNTER — Telehealth: Payer: Self-pay | Admitting: Nurse Practitioner

## 2024-05-19 ENCOUNTER — Inpatient Hospital Stay: Admitting: Nurse Practitioner

## 2024-05-27 ENCOUNTER — Encounter: Payer: Self-pay | Admitting: Oncology

## 2024-05-27 ENCOUNTER — Encounter: Payer: Self-pay | Admitting: Nurse Practitioner

## 2024-06-03 ENCOUNTER — Inpatient Hospital Stay: Payer: Self-pay | Attending: Nurse Practitioner | Admitting: Nurse Practitioner

## 2024-06-03 ENCOUNTER — Inpatient Hospital Stay: Payer: Self-pay
